# Patient Record
Sex: Female | Born: 1945 | Race: White | Hispanic: No | Marital: Married | Smoking: Never smoker
Health system: Southern US, Community
[De-identification: ages and names within clinical notes are randomized; demographics above are authoritative.]

## PROBLEM LIST (undated history)

## (undated) DIAGNOSIS — K56609 Unspecified intestinal obstruction, unspecified as to partial versus complete obstruction: Secondary | ICD-10-CM

## (undated) DIAGNOSIS — G43909 Migraine, unspecified, not intractable, without status migrainosus: Secondary | ICD-10-CM

## (undated) DIAGNOSIS — C50919 Malignant neoplasm of unspecified site of unspecified female breast: Secondary | ICD-10-CM

## (undated) DIAGNOSIS — R51 Headache: Secondary | ICD-10-CM

## (undated) DIAGNOSIS — H539 Unspecified visual disturbance: Secondary | ICD-10-CM

## (undated) HISTORY — PX: BREAST LUMPECTOMY: SHX2

## (undated) HISTORY — DX: Headache: R51

## (undated) HISTORY — DX: Malignant neoplasm of unspecified site of unspecified female breast: C50.919

## (undated) HISTORY — PX: TOTAL ABDOMINAL HYSTERECTOMY: SHX209

## (undated) HISTORY — DX: Migraine, unspecified, not intractable, without status migrainosus: G43.909

## (undated) HISTORY — DX: Unspecified visual disturbance: H53.9

## (undated) HISTORY — DX: Unspecified intestinal obstruction, unspecified as to partial versus complete obstruction: K56.609

---

## 2004-01-04 ENCOUNTER — Ambulatory Visit: Payer: Self-pay | Admitting: Oncology

## 2005-08-02 ENCOUNTER — Emergency Department (HOSPITAL_COMMUNITY): Admission: EM | Admit: 2005-08-02 | Discharge: 2005-08-02 | Payer: Self-pay | Admitting: Emergency Medicine

## 2012-06-15 ENCOUNTER — Encounter: Payer: Self-pay | Admitting: Nurse Practitioner

## 2012-06-15 ENCOUNTER — Ambulatory Visit (INDEPENDENT_AMBULATORY_CARE_PROVIDER_SITE_OTHER): Payer: Medicare Other | Admitting: Nurse Practitioner

## 2012-06-15 VITALS — BP 108/65 | HR 79 | Ht 59.5 in | Wt 118.0 lb

## 2012-06-15 DIAGNOSIS — G43009 Migraine without aura, not intractable, without status migrainosus: Secondary | ICD-10-CM

## 2012-06-15 NOTE — Progress Notes (Signed)
HPI: Patient returns for followup after last visit 09/20/2011. She has a history of migraines. She is currently on Topamax 400 mg daily even though  she has been asked to cut down the dose in the past due to her history of glaucoma. She has been on other preventatives, they were ineffective. Her headaches are in excellent control   ROS:   joint pain, constipation, weight gain, fatigue  Physical Exam General: well developed, well nourished, seated, in no evident distress Head: head normocephalic and atraumatic. Oropharynx benign Neck: supple with no carotid or supraclavicular bruits Cardiovascular: regular rate and rhythm, no murmurs  Neurologic Exam Mental Status: Awake and fully alert. Oriented to place and time. Recent and remote memory intact. Attention span, concentration and fund of knowledge appropriate. Mood and affect appropriate.  Cranial Nerves:  Pupils equal, briskly reactive to light. Extraocular movements full without nystagmus. Visual fields full to confrontation. Hearing intact and symmetric to finger snap. Facial sensation intact. Face, tongue, palate move normally and symmetrically. Neck flexion and extension normal.  Motor: Normal bulk and tone. Normal strength in all tested extremity muscles. Sensory.: intact to touch and pinprick and vibratory.  Coordination: Rapid alternating movements normal in all extremities. Finger-to-nose and heel-to-shin performed accurately bilaterally. Gait and Station: Arises from chair without difficulty. Stance is normal. Gait demonstrates normal stride length and balance . Able to heel, toe and tandem walk without difficulty.  Reflexes: 2+ and symmetric. Toes downgoing.     ASSESSMENT: Chronic headaches currently on Topamax 400 daily with excellent control.     PLAN: Continue Topamax for headache prevention, and does not need refills Imitrex acutely Followup in 6 months   Nilda Riggs, GNP-BC APRN

## 2012-06-15 NOTE — Patient Instructions (Addendum)
Continue Topamax for headache prevention Continue gabapentin for headache prevention Imitrex acutely Followup in 6 months

## 2012-07-10 ENCOUNTER — Other Ambulatory Visit: Payer: Self-pay | Admitting: Neurology

## 2012-07-21 ENCOUNTER — Other Ambulatory Visit: Payer: Self-pay | Admitting: Neurology

## 2012-09-08 ENCOUNTER — Telehealth: Payer: Self-pay | Admitting: Neurology

## 2012-09-08 MED ORDER — TOPIRAMATE 100 MG PO TABS: 200.0000 mg | ORAL_TABLET | Freq: Every day | ORAL | Status: DC

## 2012-09-08 NOTE — Telephone Encounter (Signed)
A 6 month supply was already sent last month: (E-Prescribing Status: Receipt confirmed by pharmacy (07/22/2012 8:08 AM EDT)) I have resent Rx.

## 2012-10-16 ENCOUNTER — Other Ambulatory Visit: Payer: Self-pay | Admitting: Neurology

## 2012-11-04 ENCOUNTER — Telehealth: Payer: Self-pay | Admitting: Nurse Practitioner

## 2012-11-05 NOTE — Telephone Encounter (Signed)
Please have patient increase her Gabapentin to 900mg  total dose, she can take 300mg  am 600mg  hs. I do not want to increase her Topamax as she has hx of Glaucoma . Stay at 200mg . Pt needs appt has not been seen since August 2013. Will update meds in system.  If she needs refills let us know.

## 2012-11-06 MED ORDER — TOPIRAMATE 100 MG PO TABS: ORAL_TABLET | ORAL | Status: DC

## 2012-11-06 NOTE — Telephone Encounter (Signed)
I called her back and relayed the below reccs.  She states the gabapentin does nothing for her headaches and she is aware as is her opthamologist that she has open angle glaucoma and taking th topamax.   She is reiterating her request about topamax. Please advis.e   Thanks   Headaches constant daily.

## 2012-11-06 NOTE — Telephone Encounter (Signed)
I discussed with Dr. Terrace Arabia. She wants to make sure we have records from opthamologist. Will increase, Dr. Terrace Arabia reinterated she needs an appt.

## 2012-11-06 NOTE — Telephone Encounter (Signed)
I called pt and relayed the information about topamax 100mg  po 1 tab am and 2 tab po pm.  Pt verbalized understanding.   She will have Dr. Eulah Pont send records attention to Enid Skeens, NP and she has appt 12/16/12 at 1500.

## 2012-11-20 ENCOUNTER — Other Ambulatory Visit: Payer: Self-pay | Admitting: Neurology

## 2012-12-16 ENCOUNTER — Ambulatory Visit: Payer: Medicare Other | Admitting: Nurse Practitioner

## 2012-12-30 ENCOUNTER — Other Ambulatory Visit: Payer: Self-pay | Admitting: Neurology

## 2013-01-20 ENCOUNTER — Ambulatory Visit: Payer: Medicare Other | Admitting: Nurse Practitioner

## 2013-02-18 ENCOUNTER — Other Ambulatory Visit: Payer: Self-pay | Admitting: Neurology

## 2013-02-21 ENCOUNTER — Other Ambulatory Visit: Payer: Self-pay

## 2013-02-21 MED ORDER — TOPIRAMATE 100 MG PO TABS: ORAL_TABLET | ORAL | Status: DC

## 2013-02-22 ENCOUNTER — Telehealth: Payer: Self-pay | Admitting: Neurology

## 2013-02-22 NOTE — Telephone Encounter (Signed)
Spoke to patient and relayed that she is being seen for migraines and not neuropathy.  She was told the Gabapentin was for migraines.  I explained that she would need a new referral for a new problem.  She expressed understanding and will be seeing her PCP next week and ask for the new referral at that visit.

## 2013-02-22 NOTE — Telephone Encounter (Signed)
Patient called stating that Gabapentin is not helping her legs at all, she states her legs burn all night long and that it is very disabling. Patient would like to try something else, please call her back and advise.

## 2013-02-22 NOTE — Telephone Encounter (Signed)
This patient is followed here for migraines only. She has never been evaluated for neuropathy. The Gabapentin was added for migraine. She would need a referral for a new problem from her PCP

## 2013-04-12 ENCOUNTER — Telehealth: Payer: Self-pay | Admitting: Neurology

## 2013-04-12 NOTE — Telephone Encounter (Signed)
Needs prior authorization for Sumatriptan Succinate--needs to check quantity first to see if prior authorization is needed.

## 2013-04-12 NOTE — Telephone Encounter (Signed)
I already contacted ins.  They responded by saying " 4 kits for a 30 day supply is covered by the plan with no prior authorization needed". Case # [id number]

## 2013-04-30 ENCOUNTER — Encounter: Payer: Self-pay | Admitting: Nurse Practitioner

## 2013-04-30 ENCOUNTER — Encounter (INDEPENDENT_AMBULATORY_CARE_PROVIDER_SITE_OTHER): Payer: Self-pay

## 2013-04-30 ENCOUNTER — Ambulatory Visit (INDEPENDENT_AMBULATORY_CARE_PROVIDER_SITE_OTHER): Payer: Medicare Other | Admitting: Nurse Practitioner

## 2013-04-30 VITALS — BP 123/80 | HR 101 | Ht 59.49 in | Wt 132.0 lb

## 2013-04-30 DIAGNOSIS — G43009 Migraine without aura, not intractable, without status migrainosus: Secondary | ICD-10-CM

## 2013-04-30 MED ORDER — TOPIRAMATE 100 MG PO TABS: 200.0000 mg | ORAL_TABLET | Freq: Every day | ORAL | Status: DC

## 2013-04-30 MED ORDER — GABAPENTIN 300 MG PO CAPS: ORAL_CAPSULE | ORAL | Status: DC

## 2013-04-30 MED ORDER — SUMATRIPTAN SUCCINATE 100 MG PO TABS: 100.0000 mg | ORAL_TABLET | ORAL | Status: DC | PRN

## 2013-04-30 NOTE — Progress Notes (Signed)
[facility]  PATIENT: Jill Salinas DOB: [birth year]   REASON FOR VISIT: Followup for migraines  HISTORY OF PRESENT ILLNESS: Jill Salinas, 68 year old female returns for followup,  she has migraines for many many years currently well controlled on 200 mg of Topamax at night. She also takes gabapentin which has helped her migraines as well. She uses Imitrex acutely both the injectable and the oral preparation. On return visit today she thinks her headaches are in good control. She says she was recently diagnosed with fibromyalgia and she currently has some sciatica and is going to physical therapy which was ordered by her primary care Dr. Alroy Dust. She returns for reevaluation.  HISTORY: She has a history of migraines for years.  She is currently on Topamax 400 mg daily even though she has been asked to cut down the dose in the past due to her history of glaucoma. She has been on other preventatives, such as Depakote but they were ineffective. She has also used Maxalt acutely but it no longer works. Her headaches are in excellent control      REVIEW OF SYSTEMS: Full 14 system review of systems performed and notable only for those listed, all others are neg:  Constitutional: Fatigue  Cardiovascular: N/A  Ear/Nose/Throat: N/A  Skin: N/A  Eyes: N/A  Respiratory: N/A  Gastroitestinal: N/A  Hematology/Lymphatic: N/A  Endocrine: N/A Musculoskeletal: Joint pain, aching muscles Allergy/Immunology: N/A  Neurological: N/A Psychiatric: N/A   ALLERGIES: Allergies  Allergen Reactions  . Depakote [Divalproex Sodium] Nausea And Vomiting    HOME MEDICATIONS: Outpatient Prescriptions Prior to Visit  Medication Sig Dispense Refill  . Denosumab (PROLIA [place]) Inject into the skin.      . DULoxetine (CYMBALTA) 60 MG capsule 30 mg 2 (two) times daily.      Marland Kitchen HYDROcodone-acetaminophen (NORCO) 10-325 MG per tablet 2 tablets every 4 (four) hours as needed.       . promethazine  (PHENERGAN) 25 MG tablet Take 25 mg by mouth every 6 (six) hours as needed for nausea.      . SUMAtriptan (IMITREX) 100 MG tablet TAKE ONE-HALF TO ONE TABLET BY MOUTH AS NEEDED FOR  ACUTE  MIGRAINE  -  NO  MORE  THAN  2  TABLETS  IN  24  HOURS  9 tablet  6  . SUMAtriptan (IMITREX) 6 MG/0.5ML SOLN injection Take 6 mg by mouth as needed for headache.       Marland Kitchen SYNTHROID 50 MCG tablet daily.      . temazepam (RESTORIL) 30 MG capsule Take 30 mg by mouth at bedtime as needed for sleep.      Marland Kitchen topiramate (TOPAMAX) 100 MG tablet 1 am 2 hs  90 tablet  1  . tretinoin (RETIN-A) 0.025 % cream       . gabapentin (NEURONTIN) 300 MG capsule TAKE ONE CAPSULE BY MOUTH TWICE DAILY AND TWO AT BEDTIME  120 capsule  2  . CALCIUM-VITAMIN D PO Take by mouth.      . traMADol (ULTRAM) 50 MG tablet 50 mg every 6 (six) hours as needed.        No facility-administered medications prior to visit.    PAST MEDICAL HISTORY: Past Medical History  Diagnosis Date  . Headache(784.0)   . Breast cancer     PAST SURGICAL HISTORY: Past Surgical History  Procedure Laterality Date  . Breast lumpectomy    . Total abdominal hysterectomy N/A     FAMILY HISTORY: Family History  Problem Relation Age of Onset  . Heart disease Mother   . Headache Mother   . Heart attack Father     SOCIAL HISTORY: History   Social History  . Marital Status: Single    Spouse Name: Gwyndolyn Saxon     Number of Children: 0  . Years of Education: Assoc   Occupational History  . Not on file.   Social History Main Topics  . Smoking status: Never Smoker   . Smokeless tobacco: Never Used  . Alcohol Use: Yes     Comment: drinks on the weekend  . Drug Use: No  . Sexual Activity: Not on file   Other Topics Concern  . Not on file   Social History Narrative   Patient lives at home with her husband. Gwyndolyn Saxon    Patient has an Geophysicist/field seismologist.    Patient has no children.    Patient is right handed.      PHYSICAL EXAM  Filed Vitals:    04/30/13 1117  BP: 123/80  Pulse: 101  Height: 4' 11.49" (1.511 m)  Weight: 132 lb (59.875 kg)   Body mass index is 26.23 kg/(m^2).  Generalized: Well developed, in no acute distress  Head: normocephalic and atraumatic,. Oropharynx benign  Neck: Supple, no carotid bruits  Cardiac: Regular rate rhythm, no murmur  Musculoskeletal: No deformity   Neurological examination   Mentation: Alert oriented to time, place, history taking. Follows all commands speech and language fluent  Cranial nerve II-XII: Pupils were equal round reactive to light extraocular movements were full, visual field were full on confrontational test. Facial sensation and strength were normal. hearing was intact to finger rubbing bilaterally. Uvula tongue midline. head turning and shoulder shrug were normal and symmetric.Tongue protrusion into cheek strength was normal. Motor: normal bulk and tone, full strength in the BUE, BLE, fine finger movements normal, no pronator drift. No focal weakness Coordination: finger-nose-finger, heel-to-shin bilaterally, no dysmetria Reflexes: Brachioradialis 2/2, biceps 2/2, triceps 2/2, patellar 2/2, Achilles 2/2, plantar responses were flexor bilaterally. Gait and Station: Rising up from seated position without assistance, normal stance,  moderate stride, good arm swing, smooth turning, able to perform tiptoe, and heel walking without difficulty. Tandem gait is steady  DIAGNOSTIC DATA (LABS, IMAGING, TESTING) -    ASSESSMENT AND PLAN  68 y.o. year old female  has a past medical history of Headache(784.0) here to followup. She is currently on Topamax 200 mg at night.  Continue Topamax at current dose will renew Continue gabapentin at current dose, will renew Continue sumatriptan, will renew Next visit with Dr. Krista Blue F/U 6 to 8 months [name], [facility], [facility] [address] [place], [place] [address] [phone]

## 2013-04-30 NOTE — Patient Instructions (Signed)
Continue Topamax at current dose will renew Continue gabapentin at current dose, will renew Continue sumatriptan, will renew Next visit with Dr. Krista Blue F/U 6 to 8 months

## 2013-05-07 ENCOUNTER — Telehealth: Payer: Self-pay | Admitting: Neurology

## 2013-05-07 NOTE — Telephone Encounter (Signed)
Pt called.  She was asking to get the results from the ID Gene test she had done during her 04-30-13 visit.  Please call with this information.  Thank you

## 2013-05-07 NOTE — Telephone Encounter (Signed)
Pt is calling requesting results from the ID Gene test she had done during her 04/30/13 OV. Dr. Krista Blue pt, sending to [facility], Dr. Brett Fairy. Please advise

## 2013-05-13 ENCOUNTER — Telehealth: Payer: Self-pay | Admitting: *Deleted

## 2013-05-13 NOTE — Telephone Encounter (Signed)
Called Jill Salinas to inform her that Dr. Krista Blue was out of the office until 05/26/13 and that I had given [name], the person that took the swab, the Jill Salinas's information and that as soon as the Doctor gets the results, that someone would be giving her a call back. Jill Salinas verbalized understanding.

## 2013-05-14 ENCOUNTER — Other Ambulatory Visit: Payer: Self-pay | Admitting: Neurology

## 2013-05-19 NOTE — Telephone Encounter (Signed)
I have not seen a result for this test. This was done under carolyn and i will forward this request to her.  Is this the buccal swaps new name? Than the test results take more than 14 days and come on paper. CD

## 2013-05-21 NOTE — Telephone Encounter (Signed)
I had to call for the report. Made  patient made  the Cymbalta and Hydrocodone are meds that should be used cautiously according to the report. We will send her a copy. She is agreeable to this.

## 2013-05-31 ENCOUNTER — Telehealth: Payer: Self-pay | Admitting: Neurology

## 2013-05-31 MED ORDER — TOPIRAMATE 100 MG PO TABS: ORAL_TABLET | ORAL | Status: DC

## 2013-05-31 NOTE — Telephone Encounter (Signed)
Rx has been updated and sent.  I called the patient back.  She is aware.   

## 2013-05-31 NOTE — Telephone Encounter (Signed)
Last Ov says:  She is currently on Topamax 200 mg at night. Patient states she has been taking 100mg  in am and 200mg  in pm.  She would like a new Rx sent for this dose.  Okay to change Rx?  Please advise.  Thank you.

## 2013-05-31 NOTE — Telephone Encounter (Signed)
Patient calling with questions regarding her Topamax dosage. Patient states she usually takes 1 in the morning and 2 in the evening. Patient picked up her refill and bottle states to take 2 per day. Please call and advise patient.

## 2013-05-31 NOTE — Telephone Encounter (Signed)
Yes that is ok she has been advised to decrease the dose in the past due to glaucoma but chooses not to.

## 2013-06-22 ENCOUNTER — Telehealth: Payer: Self-pay | Admitting: Neurology

## 2013-06-22 NOTE — Telephone Encounter (Signed)
Patient calling to state that she is currently in [place], and for the past month she has been feeling very lightheaded to the point where she is losing her balance and unsteady on her feet. Patient also complains about a pain on the back of her head. Please call and advise patient, she is currently at her mother's place and can be reached at [phone].

## 2013-06-22 NOTE — Telephone Encounter (Signed)
Called pt and pt stated that she is having pain in the back of her head, losing balance and unsteady on her feet. Pt wanted to know if she needed to have a test done to see if something was going on. Pt also stated that the medication is not helping. Pt's next appt is on 11/04/13. Please advise

## 2013-06-22 NOTE — Telephone Encounter (Signed)
Jill Salinas this is a new problem, we see her for migraines. Her next appt is supposed to be with Dr. Krista Blue, she is medicare

## 2013-06-23 NOTE — Telephone Encounter (Signed)
Jill Salinas, please put her on my schedule for next available.

## 2013-06-23 NOTE — Telephone Encounter (Signed)
Patient called me back Patient is having trouble walking and having nerve pain all over and she is having a lot of bone pain. Could this be coming from her migraines patient has been doing physical therapy patient stopped physical therapy one week  Ago. Patient is in [place] taking care of her mother. Patient wants to no if she needs to see Dr.Yan or  Or what route should she go please call me back at [phone].

## 2013-06-23 NOTE — Telephone Encounter (Signed)
Called patient and left her a message on her cell phone and home phone. We can see patient about her migraines but if she is having a new problem she will need a new referral . Asked patient to ask for me so I could speak to her a little more about what's  going on.  If patient wants to be seen for Migraines she can go on Dr. Rhea Belton schedule and see [name] after Dr. Krista Blue.

## 2013-06-24 NOTE — Telephone Encounter (Signed)
Called and spoke to patient she is coming to see Dr.Yan 06-25-2013.

## 2013-06-25 ENCOUNTER — Ambulatory Visit (INDEPENDENT_AMBULATORY_CARE_PROVIDER_SITE_OTHER): Payer: Medicare Other | Admitting: Neurology

## 2013-06-25 VITALS — BP 127/74 | HR 91 | Ht 59.0 in | Wt 134.0 lb

## 2013-06-25 DIAGNOSIS — M545 Low back pain, unspecified: Secondary | ICD-10-CM | POA: Insufficient documentation

## 2013-06-25 MED ORDER — MELOXICAM 7.5 MG PO TABS: 7.5000 mg | ORAL_TABLET | Freq: Two times a day (BID) | ORAL | Status: DC

## 2013-06-25 NOTE — Progress Notes (Signed)
[facility]  PATIENT: Jill Salinas DOB: 29-Sep-1945   HISTORY OF PRESENT ILLNESS: Ms. Bhagat, 68 year old female, last visit was with [name] in March 2015 for migraine.  She has migraines for many many years currently well controlled on 200 mg of Topamax at night. She also takes gabapentin 900mg  daily, which has helped her migraines as well. She uses Imitrex acutely both the injectable and the oral preparation. She thinks her headaches are in good control. She used imitrex po first, if that does not take care of her headaches, she would use injections.  This an early visit for her complains of 2 months history of right side neck pain, right occipital area pain, it bothers her 2/week, lasting all day, she felt spacy dealing with the pain, she does not like to drive,   She also had trouble walking, do not have the strength to get up an even steps, xone year, she has no incontinence, more so on the left leg, no involvement of left arm, she also has left knee swelling, felt so bad. She had 30 Lb weight gain over one year "can not get into any of my cloth"  She also complains of low back pain, radiating pain to her left leg, has tried physical therapy recently, which has been helpful, no incontinence.   REVIEW OF SYSTEMS: Full 14 system review of systems performed and notable only for those listed, all others are neg:     ALLERGIES: Allergies  Allergen Reactions  . Depakote [Divalproex Sodium] Nausea And Vomiting    HOME MEDICATIONS: Outpatient Prescriptions Prior to Visit  Medication Sig Dispense Refill  . Denosumab (PROLIA [place]) Inject into the skin.      . DULoxetine (CYMBALTA) 60 MG capsule 30 mg 2 (two) times daily.      Marland Kitchen gabapentin (NEURONTIN) 300 MG capsule 1 po daily and 2 at bedtime  90 capsule  8  . HYDROcodone-acetaminophen (NORCO) 10-325 MG per tablet 2 tablets every 4 (four) hours as needed.       . promethazine (PHENERGAN) 25 MG tablet Take 25 mg by mouth  every 6 (six) hours as needed for nausea.      . SUMAtriptan (IMITREX STATDOSE SYSTEM) 6 MG/0.5ML SOAJ Use as directed for migraine  2 mL  6  . SUMAtriptan (IMITREX) 100 MG tablet Take 1 tablet (100 mg total) by mouth as needed for migraine or headache. May repeat in 2 hours if headache persists or recurs.  9 tablet  6  . SUMAtriptan (IMITREX) 6 MG/0.5ML SOLN injection Take 6 mg by mouth as needed for headache.       Marland Kitchen SYNTHROID 50 MCG tablet daily.      . temazepam (RESTORIL) 30 MG capsule Take 30 mg by mouth at bedtime as needed for sleep.      Marland Kitchen topiramate (TOPAMAX) 100 MG tablet Take one tablet po in the am and two tabs po in the pm  90 tablet  8  . tretinoin (RETIN-A) 0.025 % cream        PAST MEDICAL HISTORY: Past Medical History  Diagnosis Date  . Headache(784.0)   . Breast cancer     PAST SURGICAL HISTORY: Past Surgical History  Procedure Laterality Date  . Breast lumpectomy    . Total abdominal hysterectomy N/A     FAMILY HISTORY: Family History  Problem Relation Age of Onset  . Heart disease Mother   . Headache Mother   . Heart attack Father  SOCIAL HISTORY: History   Social History  . Marital Status: Single    Spouse Name: Gwyndolyn Saxon     Number of Children: 0  . Years of Education: Assoc   Occupational History  . Not on file.   Social History Main Topics  . Smoking status: Never Smoker   . Smokeless tobacco: Never Used  . Alcohol Use: Yes     Comment: drinks on the weekend  . Drug Use: No  . Sexual Activity: Not on file   Other Topics Concern  . Not on file   Social History Narrative   Patient lives at home with her husband. Gwyndolyn Saxon    Patient has an Geophysicist/field seismologist.    Patient has no children.    Patient is right handed.      PHYSICAL EXAM  Filed Vitals:   06/25/13 1206  BP: 127/74  Pulse: 91  Height: 4\' 11"  (1.499 m)  Weight: 134 lb (60.782 kg)   Body mass index is 27.05 kg/(m^2).  Generalized: Well developed, in no acute  distress  Head: normocephalic and atraumatic,. Oropharynx benign  Neck: Supple, no carotid bruits  Cardiac: Regular rate rhythm, no murmur  Musculoskeletal: No deformity   Neurological examination   Mentation: Alert oriented to time, place, history taking. Follows all commands speech and language fluent  Cranial nerve II-XII: Pupils were equal round reactive to light extraocular movements were full, visual field were full on confrontational test. Facial sensation and strength were normal. hearing was intact to finger rubbing bilaterally. Uvula tongue midline. head turning and shoulder shrug were normal and symmetric.Tongue protrusion into cheek strength was normal. Motor: normal bulk and tone, full strength in the BUE, BLE, fine finger movements normal, no pronator drift. No focal weakness Coordination: finger-nose-finger, heel-to-shin bilaterally, no dysmetria Reflexes: Brachioradialis 2/2, biceps 2/2, triceps 2/2, patellar 2/2, Achilles 2/2, plantar responses were flexor bilaterally. Gait and Station: Rising up from seated position without assistance, normal stance,  moderate stride, good arm swing, smooth turning, able to perform tiptoe, and heel walking without difficulty. Tandem gait is steady  DIAGNOSTIC DATA (LABS, IMAGING, TESTING) -    ASSESSMENT AND PLAN  68 y.o. year old female  has a past medical history of Headache(784.0) here to followup. She is currently on Topamax 200 mg at night. Now complains of low back pain, radiating pain to her left lower extremity,  Differentiation diagnosis including left lumbar radiculopathy, proceed with MRI of lumbar, EMG nerve conduction study.    [facility] [address] [place], [place] [address] [phone]

## 2013-07-01 ENCOUNTER — Telehealth: Payer: Self-pay | Admitting: Neurology

## 2013-07-01 NOTE — Telephone Encounter (Signed)
Jill Salinas, patient received your message and stated she has tried all your suggestions previously.  Could Dr Krista Blue prescribe something else due to medication is hard on her stomach.  Thanks

## 2013-07-01 NOTE — Telephone Encounter (Signed)
I called the patient back.  Got no answer, left message.   

## 2013-07-01 NOTE — Telephone Encounter (Signed)
Left message for patient to call back and schedule nerve conduction and EMG test per Dr. Krista Blue.

## 2013-07-01 NOTE — Telephone Encounter (Signed)
Patient called and stated medication meloxicam (MOBIC) 7.5 MG tablet makes her very nauseated.  Please call and advise.

## 2013-07-01 NOTE — Telephone Encounter (Signed)
Patient says Meloxicam is making her nauseated and she would like something else prescribed.  I verified tat she is taking this med with food, but says that still does not help.  Please advise.

## 2013-07-02 ENCOUNTER — Encounter: Payer: Self-pay | Admitting: Neurology

## 2013-07-02 NOTE — Telephone Encounter (Signed)
I have called Jill Salinas, she complains of pain in her stomach, she is taking mobic after meal, but still has a lot of GI side effect,   She is taking pain medications for her low back pain and radiating pain to her left leg, gabapentin 300mg  1/2 qhs, complains of the side effect from gabapentin, higher dose makes her feel spacey, I have encouraged her higher dose of gabapentin up to 1800 mg a day, avoid driving  Pending MRI of lumbar, EMG nerve conduction study, depending on the result, may refer her to pain management,

## 2013-07-02 NOTE — Telephone Encounter (Signed)
Patient calling again--waiting for returned call--I told patient we are waiting on a response from Dr. Sherlyn Lees is anxious for response--thank you.

## 2013-07-13 ENCOUNTER — Encounter: Payer: Medicare Other | Admitting: Neurology

## 2013-07-14 ENCOUNTER — Ambulatory Visit
Admission: RE | Admit: 2013-07-14 | Discharge: 2013-07-14 | Disposition: A | Discharge status: Home / Self Care | Payer: Medicare Other | Source: Ambulatory Visit | Attending: Neurology | Admitting: Neurology

## 2013-07-14 DIAGNOSIS — M79609 Pain in unspecified limb: Secondary | ICD-10-CM

## 2013-07-14 DIAGNOSIS — M545 Low back pain, unspecified: Secondary | ICD-10-CM

## 2013-07-21 ENCOUNTER — Telehealth: Payer: Self-pay | Admitting: Neurology

## 2013-07-21 NOTE — Telephone Encounter (Signed)
Jill Salinas, Please call patient, MRI lumbar showed degenerative disc disease, but no significant canal, foraminal stenosis, nerve compression.

## 2013-07-27 NOTE — Telephone Encounter (Signed)
Left message with MRI lumbar results showing degenerative disc disease, per Dr. Krista Blue.  Told to call if she had any questions.

## 2013-07-28 ENCOUNTER — Telehealth: Payer: Self-pay | Admitting: Neurology

## 2013-07-28 NOTE — Telephone Encounter (Signed)
I called the patient back.  She is having increased burning sensation and does not feel that Gabapentin is helping her.  She would like to discontinue this med and requested to go on a higher dose of Mobic, or alternate medication instead.  Please advise.  Thank you.

## 2013-07-28 NOTE — Telephone Encounter (Signed)
I called the patient back.  Got no answer.  Left message. 

## 2013-07-28 NOTE — Telephone Encounter (Signed)
Jill Salinas, please advise patient that she can stop taking gabapentin if she feels like it is not helping her, keep on current dose of Mobic 7 point 5 mg twice a day, further discussion of her medication at her EMG nerve conduction study June 24th

## 2013-07-28 NOTE — Telephone Encounter (Signed)
Pt called wants to know if she can go off the  gabapentin (NEURONTIN) 300 MG capsule, and go back on the meloxicam (MOBIC) 7.5 MG tablet and increase the dosage to see if that would help the burning. Please call pt concerning this matter. Thanks

## 2013-08-04 ENCOUNTER — Encounter (INDEPENDENT_AMBULATORY_CARE_PROVIDER_SITE_OTHER): Payer: Self-pay

## 2013-08-04 ENCOUNTER — Ambulatory Visit (INDEPENDENT_AMBULATORY_CARE_PROVIDER_SITE_OTHER): Payer: Medicare Other | Admitting: Neurology

## 2013-08-04 DIAGNOSIS — M545 Low back pain, unspecified: Secondary | ICD-10-CM

## 2013-08-04 DIAGNOSIS — Z0289 Encounter for other administrative examinations: Secondary | ICD-10-CM

## 2013-08-04 DIAGNOSIS — M542 Cervicalgia: Secondary | ICD-10-CM

## 2013-08-04 DIAGNOSIS — M79605 Pain in left leg: Secondary | ICD-10-CM

## 2013-08-04 DIAGNOSIS — M79606 Pain in leg, unspecified: Secondary | ICD-10-CM | POA: Insufficient documentation

## 2013-08-04 DIAGNOSIS — M79609 Pain in unspecified limb: Secondary | ICD-10-CM

## 2013-08-04 NOTE — Progress Notes (Addendum)
[facility]  PATIENT: Jill Salinas DOB: 1945-08-30   HISTORY OF PRESENT ILLNESS: Jill Salinas, 67 year old female, last visit was with [name] in March 2015 for migraine.  She has migraines for many many years currently well controlled on 200 mg of Topamax at night. She also takes gabapentin 900mg  daily, which has helped her migraines as well. She uses Imitrex acutely both the injectable and the oral preparation. She thinks her headaches are in good control. She used imitrex po first, if that does not take care of her headaches, she would use injections.  This an early visit for her complains of 2 months history of right side neck pain, right occipital area pain, it bothers her 2/week, lasting all day, she felt spacy dealing with the pain, she does not like to drive,   She also had trouble walking, do not have the strength to get up an even steps, xone year, she has no incontinence, more so on the left leg, no involvement of left arm, she also has left knee swelling, felt so bad. She had 30 Lb weight gain over one year "can not get into any of my cloth"  She also complains of low back pain, radiating pain to her left leg, has tried physical therapy recently, which has been helpful, no incontinence.   UPDATE June 24th 2015: She came in for electrodiagnostic study today, there was no evidence of left lumbar radiculopathy, there was evidence of right triceps irritation, but no neuropathic changes at other selected right upper extremity needle examination.  We have reviewed MRI lumbar film together, only mild degenerative disc disease, there was no significant foraminal, or canal stenosis   She is taking Cymbalta now, which has helped her some, but continued complaint bilateral lower extremity deep achy pain, especially at nighttime, she no longer has shooting pain from her neck to her shoulders, or arms, but she complains of deep achy pain in her midline upper cervical  region,  She also complains fatigued easily, lack of stamina  REVIEW OF SYSTEMS: Full 14 system review of systems performed and notable only for those listed, all others are neg:      ALLERGIES: Allergies  Allergen Reactions  . Depakote [Divalproex Sodium] Nausea And Vomiting    HOME MEDICATIONS: Outpatient Prescriptions Prior to Visit  Medication Sig Dispense Refill  . Denosumab (PROLIA [place]) Inject into the skin.      . DULoxetine (CYMBALTA) 60 MG capsule 30 mg 2 (two) times daily.      Marland Kitchen gabapentin (NEURONTIN) 300 MG capsule 1 po daily and 2 at bedtime  90 capsule  8  . HYDROcodone-acetaminophen (NORCO) 10-325 MG per tablet 2 tablets every 4 (four) hours as needed.       . promethazine (PHENERGAN) 25 MG tablet Take 25 mg by mouth every 6 (six) hours as needed for nausea.      . SUMAtriptan (IMITREX STATDOSE SYSTEM) 6 MG/0.5ML SOAJ Use as directed for migraine  2 mL  6  . SUMAtriptan (IMITREX) 100 MG tablet Take 1 tablet (100 mg total) by mouth as needed for migraine or headache. May repeat in 2 hours if headache persists or recurs.  9 tablet  6  . SUMAtriptan (IMITREX) 6 MG/0.5ML SOLN injection Take 6 mg by mouth as needed for headache.       Marland Kitchen SYNTHROID 50 MCG tablet daily.      . temazepam (RESTORIL) 30 MG capsule Take 30 mg by mouth at bedtime as needed  for sleep.      Marland Kitchen topiramate (TOPAMAX) 100 MG tablet Take one tablet po in the am and two tabs po in the pm  90 tablet  8  . tretinoin (RETIN-A) 0.025 % cream        PAST MEDICAL HISTORY: Past Medical History  Diagnosis Date  . Headache(784.0)   . Breast cancer     PAST SURGICAL HISTORY: Past Surgical History  Procedure Laterality Date  . Breast lumpectomy    . Total abdominal hysterectomy N/A     FAMILY HISTORY: Family History  Problem Relation Age of Onset  . Heart disease Mother   . Headache Mother   . Heart attack Father     SOCIAL HISTORY: History   Social History  . Marital Status: Single    Spouse  Name: Gwyndolyn Saxon     Number of Children: 0  . Years of Education: Assoc   Occupational History  . Not on file.   Social History Main Topics  . Smoking status: Never Smoker   . Smokeless tobacco: Never Used  . Alcohol Use: Yes     Comment: drinks on the weekend  . Drug Use: No  . Sexual Activity: Not on file   Other Topics Concern  . Not on file   Social History Narrative   Patient lives at home with her husband. Gwyndolyn Saxon    Patient has an Geophysicist/field seismologist.    Patient has no children.    Patient is right handed.      PHYSICAL EXAM  There were no vitals filed for this visit. There is no weight on file to calculate BMI.  Generalized: Well developed, in no acute distress  Head: normocephalic and atraumatic,. Oropharynx benign  Neck: Supple, no carotid bruits  Cardiac: Regular rate rhythm, no murmur  Musculoskeletal: No deformity   Neurological examination   Mentation: Alert oriented to time, place, history taking. Follows all commands speech and language fluent  Cranial nerve II-XII: Pupils were equal round reactive to light extraocular movements were full, visual field were full on confrontational test. Facial sensation and strength were normal. hearing was intact to finger rubbing bilaterally. Uvula tongue midline. head turning and shoulder shrug were normal and symmetric.Tongue protrusion into cheek strength was normal. Motor: normal bulk and tone, full strength in the BUE, BLE, fine finger movements normal, no pronator drift. No focal weakness Coordination: finger-nose-finger, heel-to-shin bilaterally, no dysmetria Reflexes: Brachioradialis 3/3, biceps 3/3, triceps 3/3, patellar 3/3, Achilles 2/2, plantar responses were flexor bilaterally. Gait and Station: Rising up from seated position without assistance, normal stance,  moderate stride, good arm swing, smooth turning, able to perform tiptoe, and heel walking without difficulty. Tandem gait is steady  DIAGNOSTIC DATA  (LABS, IMAGING, TESTING) -    ASSESSMENT AND PLAN  68 y.o. year old female  has a past medical history of Headache(784.0) here to followup. She is currently on Topamax 200 mg at night. Now complains of low back pain, radiating pain to her left lower extremity, also complains of neck pain, bilateral shoulder deep achy pain, lack of stamina, bilateral lower extremity deep achy pain, today's electrodiagnostic study showed no evidence of right lumbar radiculopathy, MRI of lumbar spine only demonstrated mild degenerative disc disease, she was found to be hyperreflexia, irritation at right triceps muscles at selected needle examination, but no evidence of active right cervical radiculopathy Differentiation diagnosis also including, cervical spondylitic myelopathy, will proceed with MRI of cervical spine, return to clinic in one month  [facility] [address] [place], [place] [address] [phone]

## 2013-08-04 NOTE — Procedures (Addendum)
   NCS (NERVE CONDUCTION STUDY) WITH EMG (ELECTROMYOGRAPHY) REPORT   STUDY DATE: June 24th 2015 PATIENT NAME: [name] DOB: 1945/10/03 MRN: [id number]    TECHNOLOGIST: Laretta Alstrom ELECTROMYOGRAPHER: Marcial Pacas M.D.  CLINICAL INFORMATION:  68 years old female, complains of neck achy pain, low back pain, radiating pain to her left leg  FINDINGS: NERVE CONDUCTION STUDY: Bilateral peroneal sensory responses were normal. Bilateral peroneal, tibial plantar responses were normal. Bilateral tibial H. reflexes were normal and symmetric  Bilateral median, ulnar sensory and motor responses were normal  NEEDLE ELECTROMYOGRAPHY: Selected needle examination was performed at right upper extremity muscles, right cervical paraspinals, left lower extremity muscles, left lumbosacral paraspinal muscles.  Needle examination of left tibialis anterior, tibialis posterior, medial gastrocnemius, peroneal longus, vastus lateralis was normal There was no spontaneous activity at left lumbosacral paraspinal muscles, left L4-5 S1  Needle examination of right extensor digital communis, biceps, deltoid, pronator teres was normal.  There was increased insertion activity at right triceps, CRDs,normal morphology and motor unit potential, with normal recruitment patterns  There was no spontaneous activity at right cervical paraspinal muscles, right C5-6 and 7  IMPRESSION:   This is a slight abnormal study. There is evidence of irritation at right C7 nerve roots, at right triceps muscles, but there was no evidence of active right cervical radiculopathy, there was no evidence of left lumbar radiculopathy, or large fiber peripheral neuropathy,   INTERPRETING PHYSICIAN:   Marcial Pacas M.D. Ph.D. [facility] [address] [place], [place] [address] [phone]

## 2013-09-07 ENCOUNTER — Other Ambulatory Visit: Payer: Medicare Other

## 2013-09-08 ENCOUNTER — Ambulatory Visit
Admission: RE | Admit: 2013-09-08 | Discharge: 2013-09-08 | Disposition: A | Discharge status: Home / Self Care | Payer: Medicare Other | Source: Ambulatory Visit | Attending: Neurology | Admitting: Neurology

## 2013-09-08 DIAGNOSIS — M542 Cervicalgia: Secondary | ICD-10-CM

## 2013-09-08 DIAGNOSIS — M79605 Pain in left leg: Secondary | ICD-10-CM

## 2013-09-09 ENCOUNTER — Telehealth: Payer: Self-pay | Admitting: Neurology

## 2013-09-09 NOTE — Telephone Encounter (Signed)
Will go over MRI cervical in her September 10 2013 visit.

## 2013-09-10 ENCOUNTER — Encounter: Payer: Self-pay | Admitting: Neurology

## 2013-09-10 ENCOUNTER — Ambulatory Visit (INDEPENDENT_AMBULATORY_CARE_PROVIDER_SITE_OTHER): Payer: Medicare Other | Admitting: Neurology

## 2013-09-10 VITALS — BP 116/67 | HR 79 | Ht 59.0 in | Wt 132.0 lb

## 2013-09-10 DIAGNOSIS — G43009 Migraine without aura, not intractable, without status migrainosus: Secondary | ICD-10-CM

## 2013-09-10 DIAGNOSIS — M545 Low back pain, unspecified: Secondary | ICD-10-CM

## 2013-09-10 DIAGNOSIS — G43909 Migraine, unspecified, not intractable, without status migrainosus: Secondary | ICD-10-CM | POA: Insufficient documentation

## 2013-09-10 NOTE — Progress Notes (Signed)
[facility]  PATIENT: Jill Salinas DOB: 12-12-1945   HISTORY OF PRESENT ILLNESS: Jill Salinas, 68 year old female, last visit was with [name] in March 2015 for migraine.  She has migraines for many many years currently well controlled on 200 mg of Topamax at night. She also takes gabapentin 900mg  daily, which has helped her migraines as well. She uses Imitrex acutely both the injectable and the oral preparation. She thinks her headaches are in good control. She used imitrex po first, if that does not take care of her headaches, she would use injections.  This an early visit for her complains of 2 months history of right side neck pain, right occipital area pain, it bothers her 2/week, lasting all day, she felt spacy dealing with the pain, she does not like to drive,   She also had trouble walking, do not have the strength to get up an even steps, xone year, she has no incontinence, more so on the left leg, no involvement of left arm, she also has left knee swelling, felt so bad. She had 30 Lb weight gain over one year "can not get into any of my cloth"  She also complains of low back pain, radiating pain to her left leg, has tried physical therapy recently, which has been helpful, no incontinence.   UPDATE June 24th 2015: She came in for electrodiagnostic study today, there was no evidence of left lumbar radiculopathy, there was evidence of right triceps irritation, but no neuropathic changes at other selected right upper extremity needle examination.  We have reviewed MRI lumbar film together, only mild degenerative disc disease, there was no significant foraminal, or canal stenosis   She is taking Cymbalta now, which has helped her some, but continued complaint bilateral lower extremity deep achy pain, especially at nighttime, she no longer has shooting pain from her neck to her shoulders, or arms, but she complains of deep achy pain in her midline upper cervical  region,  She also complains fatigued easily, lack of stamina  UPDATE September 10 2013:  Her main concern is her constant midline low back pain, there is no radiating pain, but she complains of bilateral lower extremity heaviness, burning discomfort sometimes, she also complains of generalized fatigue, lack of stamina,  She denies significant neck pain, no bowel bladder incontinence, have reviewed MRI of cervical spine, C3-4: left facet hypertrophy with severe left foraminal stenosis.  Mild disc bulging at C3-4, C5-6, 6-7. There was no evidence of central canal stenosis  REVIEW OF SYSTEMS: Full 14 system review of systems performed and notable only for those listed, all others are neg:   Fatigue, drink sweating, low back pain, achy muscles, walking difficulty, weakness,     ALLERGIES: Allergies  Allergen Reactions  . Depakote [Divalproex Sodium] Nausea And Vomiting  . Iodinated Diagnostic Agents     Tthroat itching    HOME MEDICATIONS: Outpatient Prescriptions Prior to Visit  Medication Sig Dispense Refill  . Denosumab (PROLIA [place]) Inject into the skin.      . DULoxetine (CYMBALTA) 60 MG capsule 30 mg 2 (two) times daily.      Marland Kitchen gabapentin (NEURONTIN) 300 MG capsule 1 po daily and 2 at bedtime  90 capsule  8  . HYDROcodone-acetaminophen (NORCO) 10-325 MG per tablet 2 tablets every 4 (four) hours as needed.       . promethazine (PHENERGAN) 25 MG tablet Take 25 mg by mouth every 6 (six) hours as needed for nausea.      Marland Kitchen  SUMAtriptan (IMITREX STATDOSE SYSTEM) 6 MG/0.5ML SOAJ Use as directed for migraine  2 mL  6  . SUMAtriptan (IMITREX) 100 MG tablet Take 1 tablet (100 mg total) by mouth as needed for migraine or headache. May repeat in 2 hours if headache persists or recurs.  9 tablet  6  . SUMAtriptan (IMITREX) 6 MG/0.5ML SOLN injection Take 6 mg by mouth as needed for headache.       Marland Kitchen SYNTHROID 50 MCG tablet daily.      . temazepam (RESTORIL) 30 MG capsule Take 30 mg by mouth at  bedtime as needed for sleep.      Marland Kitchen topiramate (TOPAMAX) 100 MG tablet Take one tablet po in the am and two tabs po in the pm  90 tablet  8  . tretinoin (RETIN-A) 0.025 % cream        PAST MEDICAL HISTORY: Past Medical History  Diagnosis Date  . Headache(784.0)   . Breast cancer   . Migraine     PAST SURGICAL HISTORY: Past Surgical History  Procedure Laterality Date  . Breast lumpectomy    . Total abdominal hysterectomy N/A     FAMILY HISTORY: Family History  Problem Relation Age of Onset  . Heart disease Mother   . Headache Mother   . Heart attack Father     SOCIAL HISTORY: History   Social History  . Marital Status: Single    Spouse Name: Gwyndolyn Saxon     Number of Children: 0  . Years of Education: Assoc   Occupational History  . Not on file.   Social History Main Topics  . Smoking status: Never Smoker   . Smokeless tobacco: Never Used  . Alcohol Use: Yes     Comment: drinks on the weekend  . Drug Use: No  . Sexual Activity: Not on file   Other Topics Concern  . Not on file   Social History Narrative   Patient lives at home with her husband. Gwyndolyn Saxon    Patient has an Geophysicist/field seismologist.    Patient has no children.    Patient is right handed.      PHYSICAL EXAM  Filed Vitals:   09/10/13 1440  BP: 116/67  Pulse: 79  Height: 4\' 11"  (1.499 m)  Weight: 132 lb (59.875 kg)   Body mass index is 26.65 kg/(m^2).  Generalized: Well developed, in no acute distress  Head: normocephalic and atraumatic,. Oropharynx benign  Neck: Supple, no carotid bruits  Cardiac: Regular rate rhythm, no murmur  Musculoskeletal: No deformity   Neurological examination   Mentation: Alert oriented to time, place, history taking. Follows all commands speech and language fluent  Cranial nerve II-XII: Pupils were equal round reactive to light extraocular movements were full, visual field were full on confrontational test. Facial sensation and strength were normal. hearing was  intact to finger rubbing bilaterally. Uvula tongue midline. head turning and shoulder shrug were normal and symmetric.Tongue protrusion into cheek strength was normal. Motor: normal bulk and tone, full strength in the BUE, BLE, fine finger movements normal, no pronator drift. No focal weakness Coordination: finger-nose-finger, heel-to-shin bilaterally, no dysmetria Reflexes: Brisk and symmetric, plantar responses were flexor bilaterally. Gait and Station: Rising up from seated position without assistance, normal stance,  moderate stride, good arm swing, smooth turning, able to perform tiptoe, and heel walking without difficulty. Tandem gait is steady  DIAGNOSTIC DATA (LABS, IMAGING, TESTING) -  ASSESSMENT AND PLAN  68 y.o. year old female   with past  medical history of headache. She is currently on Topamax 200 mg at night. Now complains of low back pain, radiating pain to her left lower extremity, also complains of neck pain, bilateral shoulder deep achy pain, lack of stamina, bilateral lower extremity deep achy pain,  electrodiagnostic study showed no evidence of right lumbar radiculopathy, MRI of lumbar spine only demonstrated mild degenerative disc disease,  MRI of cervical spine showed C3-4: left facet hypertrophy with severe left foraminal stenosis, she denies significant neck pain, there was no significant weakness.  I will refer her to physical therapy, she may continue followup with her primary care, and pain management for management of her chronic pain, and fatigue  Marcial Pacas, M.D. [facility] [address] [place], [place] [address] [phone]

## 2013-09-15 ENCOUNTER — Telehealth: Payer: Self-pay | Admitting: Neurology

## 2013-09-15 MED ORDER — MELOXICAM 7.5 MG PO TABS: 7.5000 mg | ORAL_TABLET | Freq: Two times a day (BID) | ORAL | Status: DC

## 2013-09-15 NOTE — Addendum Note (Signed)
Addended by: Marcial Pacas on: 09/15/2013 02:57 PM   Modules accepted: Orders

## 2013-09-15 NOTE — Telephone Encounter (Signed)
I have called her, she complains of low back pain, radiating pain to left leg, she has been taken neurontin, complains of excessive weight gain, also worries about weight gain side effect from Lyrica, from her step mother's experience.  I called in Mobic 7.5mg  bid prn, after meal

## 2013-09-15 NOTE — Telephone Encounter (Signed)
Patient calling to request that her Gabapentin medication be changed to something else because she has noticed that it is causing her to gain weight, please return call to patient and advise.

## 2013-09-15 NOTE — Telephone Encounter (Signed)
Per phone note on 06/17, Dr Krista Blue said patient could D/C Gabapentin.  I called the patient back.  Got no answer.  Left message.

## 2013-09-29 ENCOUNTER — Telehealth: Payer: Self-pay | Admitting: *Deleted

## 2013-09-29 ENCOUNTER — Telehealth: Payer: Self-pay | Admitting: Neurology

## 2013-09-29 DIAGNOSIS — M5432 Sciatica, left side: Secondary | ICD-10-CM

## 2013-09-29 NOTE — Telephone Encounter (Signed)
Patient called saying Mobic isn't helping with her pain and she would like a dose increase (already at max dose of 15mg  daily) or a new Rx prescribed. I called the patient back. Said she has decided she does not want to see a pain doctor and does not wish to consult PCP regarding pain. She would like something prescribed by [place]. Dr Krista Blue is out of the office. Forwarding message to [facility] for review.

## 2013-09-29 NOTE — Telephone Encounter (Signed)
Patient stated meloxicam (MOBIC) 7.5 MG tablet isn't helping with pain.  She takes 1 tab in the am and 1 tab in the pm, unable to sleep due to pain.  Feels she can't function during the day due to pain.  Questioning if she needs to increase dosage or does she need another medication?  Please call anytime and if not available on home number, please call cell @ [phone].  May leave message if not available.

## 2013-09-29 NOTE — Telephone Encounter (Signed)
I called back.  Relayed message from Dr Rexene Alberts.  Patient said she will use heat on her back, and again repeated she is not willing to go to a pain clinic.  Says she will call us back if anything further is needed.

## 2013-09-29 NOTE — Telephone Encounter (Signed)
I called patient. The patient is having ongoing chronic low back pain for 7 years, worse over the last one year. MRI of the lumbosacral spine showing relatively mild spondylitic changes, without nerve root impingement. The patient reports pain down the left leg. I will try an epidural steroid injection to see if this is beneficial. The patient has not gained much benefit with gabapentin, Mobic, or Cymbalta.

## 2013-09-29 NOTE — Telephone Encounter (Signed)
Patient calling needing a release form mail.

## 2013-09-29 NOTE — Telephone Encounter (Signed)
Patient called saying Mobic isn't helping with her pain and she would like a dose increase (already at max dose of 15mg  daily) or a new Rx prescribed.  I called the patient back.  Said she has decided she does not want to see a pain doctor and does not wish to consult PCP regarding pain.  She would like something prescribed by [place].  Dr Krista Blue is out of the office.  Forwarding message to [facility] for review.    OV note from 07/31 says: I will refer her to physical therapy, she may continue followup with her primary care, and pain management for management of her chronic pain, and fatigue. Phone note from 08/05 says: I have called her, she complains of low back pain, radiating pain to left leg, she has been taken neurontin, complains of excessive weight gain, also worries about weight gain side effect from Lyrica, from her step mother's experience.  I called in Mobic 7.5mg  bid prn, after meal

## 2013-09-29 NOTE — Addendum Note (Signed)
Addended by: Margette Fast on: 09/29/2013 05:03 PM   Modules accepted: Orders

## 2013-09-29 NOTE — Telephone Encounter (Signed)
Unfortunately, I really don't have any bright ideas regarding back pain management. We typically don't manage chronic back pain. I think the best option would be to follow recommendations from Dr. Krista Blue to pursue physical therapy , maybe a referral to pain management versus orthopedics. Sometimes a heat pad helps.

## 2013-09-30 ENCOUNTER — Other Ambulatory Visit: Payer: Self-pay | Admitting: Neurology

## 2013-09-30 DIAGNOSIS — M79605 Pain in left leg: Secondary | ICD-10-CM

## 2013-09-30 DIAGNOSIS — G8929 Other chronic pain: Secondary | ICD-10-CM

## 2013-09-30 DIAGNOSIS — M545 Low back pain, unspecified: Secondary | ICD-10-CM

## 2013-10-01 ENCOUNTER — Telehealth: Payer: Self-pay | Admitting: *Deleted

## 2013-10-01 NOTE — Telephone Encounter (Addendum)
Received fax from [facility], stating pt is scheduled for her injection. (epidural steroid).  10-04-13 1330  ([address]).

## 2013-10-04 ENCOUNTER — Ambulatory Visit
Admission: RE | Admit: 2013-10-04 | Discharge: 2013-10-04 | Disposition: A | Discharge status: Home / Self Care | Payer: Medicare Other | Source: Ambulatory Visit | Attending: Neurology | Admitting: Neurology

## 2013-10-04 DIAGNOSIS — G8929 Other chronic pain: Secondary | ICD-10-CM

## 2013-10-04 DIAGNOSIS — M79605 Pain in left leg: Secondary | ICD-10-CM

## 2013-10-04 DIAGNOSIS — M545 Low back pain, unspecified: Secondary | ICD-10-CM

## 2013-10-04 MED ORDER — METHYLPREDNISOLONE ACETATE 40 MG/ML INJ SUSP (RADIOLOG
120.0000 mg | Freq: Once | INTRAMUSCULAR | Status: AC
  Administered 2013-10-04: 120 mg via EPIDURAL

## 2013-10-04 MED ORDER — IOHEXOL 180 MG/ML  SOLN
1.0000 mL | Freq: Once | INTRAMUSCULAR | Status: AC | PRN
  Administered 2013-10-04: 1 mL via EPIDURAL

## 2013-10-04 NOTE — Discharge Instructions (Signed)

## 2013-10-12 ENCOUNTER — Telehealth: Payer: Self-pay | Admitting: *Deleted

## 2013-10-12 NOTE — Telephone Encounter (Signed)
We will address her issue into September 24 followup visit

## 2013-10-12 NOTE — Telephone Encounter (Signed)
Pt calling about getting another epidural injection.  She had last one 10-04-13 at [facility] with Dr. Lawrence Santiago.  [phone], [phone].  (She had such good results from the last one) and noted that even so brief she wanted to see about getting another.  I relayed that I did not know or if they mentioned about when it would be safe to get another one.  She did not know.

## 2013-10-22 ENCOUNTER — Telehealth: Payer: Self-pay | Admitting: Neurology

## 2013-10-22 NOTE — Telephone Encounter (Signed)
I called pt and LMVM for her that apologized that did not receive call back. Dr Krista Blue made note, and wanted to address when you are in 11-04-13.  Pt to call back if having issues and need sooner appt, try to get in.

## 2013-10-22 NOTE — Telephone Encounter (Signed)
I called pt and was able to move appt up to 10-25-13 at 1130 to reassess. Pt verbalized understanding.  She stated meloxicam not working.

## 2013-10-22 NOTE — Telephone Encounter (Signed)
Danielle with [facility] @ [phone] called questioning if patient will have 2nd injection after OV with Dr. Krista Blue on 9/24?  Please call and advise.

## 2013-10-25 ENCOUNTER — Telehealth: Payer: Self-pay

## 2013-10-25 ENCOUNTER — Encounter: Payer: Self-pay | Admitting: Neurology

## 2013-10-25 ENCOUNTER — Ambulatory Visit (INDEPENDENT_AMBULATORY_CARE_PROVIDER_SITE_OTHER): Payer: Medicare Other | Admitting: Neurology

## 2013-10-25 VITALS — BP 107/69 | HR 99 | Ht 59.0 in | Wt 126.0 lb

## 2013-10-25 DIAGNOSIS — M545 Low back pain, unspecified: Secondary | ICD-10-CM

## 2013-10-25 DIAGNOSIS — G43009 Migraine without aura, not intractable, without status migrainosus: Secondary | ICD-10-CM

## 2013-10-25 MED ORDER — CYCLOBENZAPRINE HCL 5 MG PO TABS: 5.0000 mg | ORAL_TABLET | Freq: Three times a day (TID) | ORAL | Status: DC | PRN

## 2013-10-25 NOTE — Progress Notes (Signed)
[facility]  PATIENT: Jill Salinas DOB: 28-Dec-1945   HISTORY OF PRESENT ILLNESS: Jill Salinas, 68 year old female, last visit was with Jill Salinas in March 2015 for migraine.  She has migraines for many many years currently well controlled on 200 mg of Topamax at night. She also takes gabapentin 900mg  daily, which has helped her migraines as well. She uses Imitrex acutely both the injectable and the oral preparation. She thinks her headaches are in good control. She used imitrex po first, if that does not take care of her headaches, she would use injections.  This an early visit for her complains of 2 months history of right side neck pain, right occipital area pain, it bothers her 2/week, lasting all day, she felt spacy dealing with the pain, she does not like to drive,   She also had trouble walking, do not have the strength to get up an even steps, xone year, she has no incontinence, more so on the left leg, no involvement of left arm, she also has left knee swelling, felt so bad. She had 30 Lb weight gain over one year "can not get into any of my cloth"  She also complains of low back pain, radiating pain to her left leg, has tried physical therapy recently, which has been helpful, no incontinence.   UPDATE June 24th 2015: She came in for electrodiagnostic study today, there was no evidence of left lumbar radiculopathy, there was evidence of right triceps irritation, but no neuropathic changes at other selected right upper extremity needle examination.  We have reviewed MRI lumbar film together, only mild degenerative disc disease, there was no significant foraminal, or canal stenosis   She is taking Cymbalta now, which has helped her some, but continued complaint bilateral lower extremity deep achy pain, especially at nighttime, she no longer has shooting pain from her neck to her shoulders, or arms, but she complains of deep achy pain in her midline upper cervical  region,  She also complains fatigued easily, lack of stamina  UPDATE September 10 2013:  Her main concern is her constant midline low back pain, there is no radiating pain, but she complains of bilateral lower extremity heaviness, burning discomfort sometimes, she also complains of generalized fatigue, lack of stamina,  She denies significant neck pain, no bowel bladder incontinence, have reviewed MRI of cervical spine, C3-4: left facet hypertrophy with severe left foraminal stenosis.  Mild disc bulging at C3-4, C5-6, 6-7. There was no evidence of central canal stenosis  UPDATE Sept 14th 2015: She had left lumbar epidural injection, by interventional radiologist Dr. Lawrence Salinas at left L4-5 space, in August 24th 2015, she only had transient improvement,   Now she complains of left lower extremity pain, bilateral lower extremity deep achy pain, feet paresthesia, gait difficulty,  REVIEW OF SYSTEMS: Full 14 system review of systems performed and notable only for those listed, all others are neg:   Fatigue, joint pain, joint swelling, weakness.     ALLERGIES: Allergies  Allergen Reactions  . Depakote [Divalproex Sodium] Nausea And Vomiting  . Iodinated Diagnostic Agents Other (See Comments)    Tthroat itching, pt received a 13 hour prep for injection    HOME MEDICATIONS: Outpatient Prescriptions Prior to Visit  Medication Sig Dispense Refill  . Denosumab (PROLIA Lillie) Inject into the skin.      . DULoxetine (CYMBALTA) 60 MG capsule 30 mg 2 (two) times daily.      Marland Kitchen gabapentin (NEURONTIN) 300 MG capsule 1  po daily and 2 at bedtime  90 capsule  8  . HYDROcodone-acetaminophen (NORCO) 10-325 MG per tablet 2 tablets every 4 (four) hours as needed.       . promethazine (PHENERGAN) 25 MG tablet Take 25 mg by mouth every 6 (six) hours as needed for nausea.      . SUMAtriptan (IMITREX STATDOSE SYSTEM) 6 MG/0.5ML SOAJ Use as directed for migraine  2 mL  6  . SUMAtriptan (IMITREX) 100 MG tablet  Take 1 tablet (100 mg total) by mouth as needed for migraine or headache. May repeat in 2 hours if headache persists or recurs.  9 tablet  6  . SUMAtriptan (IMITREX) 6 MG/0.5ML SOLN injection Take 6 mg by mouth as needed for headache.       Marland Kitchen SYNTHROID 50 MCG tablet daily.      . temazepam (RESTORIL) 30 MG capsule Take 30 mg by mouth at bedtime as needed for sleep.      Marland Kitchen topiramate (TOPAMAX) 100 MG tablet Take one tablet po in the am and two tabs po in the pm  90 tablet  8  . tretinoin (RETIN-A) 0.025 % cream        PAST MEDICAL HISTORY: Past Medical History  Diagnosis Date  . Headache(784.0)   . Breast cancer   . Migraine     PAST SURGICAL HISTORY: Past Surgical History  Procedure Laterality Date  . Breast lumpectomy    . Total abdominal hysterectomy N/A     FAMILY HISTORY: Family History  Problem Relation Age of Onset  . Heart disease Mother   . Headache Mother   . Heart attack Father     SOCIAL HISTORY: History   Social History  . Marital Status: Single    Spouse Name: Jill Salinas     Number of Children: 0  . Years of Education: Assoc   Occupational History  . Not on file.   Social History Main Topics  . Smoking status: Never Smoker   . Smokeless tobacco: Never Used  . Alcohol Use: Yes     Comment: drinks on the weekend  . Drug Use: No  . Sexual Activity: Not on file   Other Topics Concern  . Not on file   Social History Narrative   Patient lives at home with her husband. Jill Salinas    Patient has an Geophysicist/field seismologist.    Patient has no children.    Patient is right handed.      PHYSICAL EXAM  Filed Vitals:   10/25/13 1143  Height: 4\' 11"  (1.499 m)  Weight: 126 lb (57.153 kg)   Body mass index is 25.44 kg/(m^2).  Generalized: Well developed, in no acute distress  Head: normocephalic and atraumatic,. Oropharynx benign  Neck: Supple, no carotid bruits  Cardiac: Regular rate rhythm, no murmur  Musculoskeletal: No deformity   Neurological  examination   Mentation: Alert oriented to time, place, history taking. Follows all commands speech and language fluent  Cranial nerve II-XII: Pupils were equal round reactive to light extraocular movements were full, visual field were full on confrontational test. Facial sensation and strength were normal. hearing was intact to finger rubbing bilaterally. Uvula tongue midline. head turning and shoulder shrug were normal and symmetric.Tongue protrusion into cheek strength was normal. Motor: normal bulk and tone, full strength in the BUE, BLE, fine finger movements normal, no pronator drift. No focal weakness Coordination: finger-nose-finger, heel-to-shin bilaterally, no dysmetria Reflexes: Brisk and symmetric, plantar responses were flexor bilaterally. Gait and  Station: Rising up from seated position without assistance, normal stance,  moderate stride, good arm swing, smooth turning, able to perform tiptoe, and heel walking without difficulty. Tandem gait is steady  DIAGNOSTIC DATA (LABS, IMAGING, TESTING) -  ASSESSMENT AND PLAN  68 y.o. year old female   with past medical history of headache. She is currently on Topamax 200 mg at night. Now complains of low back pain, radiating pain to her left lower extremity, also complains of neck pain, bilateral shoulder deep achy pain, lack of stamina, bilateral lower extremity deep achy pain, most consistent with musculoskeletal etiology,  Electrodiagnostic study showed no evidence of right lumbar radiculopathy, MRI of lumbar spine only demonstrated mild degenerative disc disease,  MRI of cervical spine showed C3-4: left facet hypertrophy with severe left foraminal stenosis, she denies significant neck pain, there was no significant weakness.  I will refer her to pain management for management of her chronic pain Will also try Flexeril 5 mg 3 times a day,   Marcial Pacas, M.D. [facility] [address] [place],   [address] [phone]

## 2013-10-25 NOTE — Telephone Encounter (Signed)
Patient wants you to call in Muscle relaxer.

## 2013-10-25 NOTE — Telephone Encounter (Signed)
I have called in flexerill 5mg  tid

## 2013-11-04 ENCOUNTER — Ambulatory Visit: Payer: Medicare Other | Admitting: Neurology

## 2013-11-04 ENCOUNTER — Telehealth: Payer: Self-pay | Admitting: Neurology

## 2013-11-04 NOTE — Telephone Encounter (Signed)
Patient calling to make Dr. Krista Blue aware that she has decided to see a pain management specialist in [place] since it's closer to her. If more questions, please call.

## 2013-11-18 ENCOUNTER — Telehealth: Payer: Self-pay | Admitting: Neurology

## 2013-11-18 NOTE — Telephone Encounter (Signed)
Patient wanted Dr. Krista Blue to know that she has decided not to go to Pain Management as discussed.  FYI

## 2013-12-29 ENCOUNTER — Encounter: Payer: Self-pay | Admitting: Neurology

## 2014-01-04 ENCOUNTER — Encounter: Payer: Self-pay | Admitting: Neurology

## 2014-01-26 ENCOUNTER — Telehealth: Payer: Self-pay | Admitting: Neurology

## 2014-01-26 NOTE — Telephone Encounter (Signed)
Patient stated she's taking Rx topiramate (TOPAMAX) 100 MG tablet 1 tab in am and 2 tablets at pm, but medication isn't helping with pain.  Questioning if pain management could give her injection at base of skull due to pain at back of head.  Please call and advise

## 2014-01-26 NOTE — Telephone Encounter (Signed)
Dana:  please call patient, make sure she follow-up with her pain management

## 2014-01-27 NOTE — Telephone Encounter (Signed)
Called patient and spoke with her and stated to her please keep her appt. With pain mgt. Patient stated she would and she understood this process.

## 2014-03-23 ENCOUNTER — Other Ambulatory Visit: Payer: Self-pay | Admitting: Nurse Practitioner

## 2014-03-23 NOTE — Telephone Encounter (Signed)
Dose per note from 04/20

## 2014-05-12 ENCOUNTER — Other Ambulatory Visit: Payer: Self-pay | Admitting: Nurse Practitioner

## 2014-05-31 ENCOUNTER — Other Ambulatory Visit: Payer: Self-pay | Admitting: Neurology

## 2014-05-31 ENCOUNTER — Telehealth: Payer: Self-pay | Admitting: Neurology

## 2014-05-31 NOTE — Telephone Encounter (Signed)
Jill Salinas, give her a follow-up appointment, we will may do nerve block, IV infusion as abortive treatment, will also go over her medication at follow-up visit

## 2014-05-31 NOTE — Telephone Encounter (Signed)
Patient is calling as she is having extreme migraines.  She is now taking topamax 100 mg, 1/day and 2/night.  Also takes imitrex 6 mg/0.31ml.  Can you give her something for the next 2 wees that will not make her sleepy. She sees her pain management doctor, Dr. Blanch Media on May 16th.  Please call.

## 2014-05-31 NOTE — Telephone Encounter (Signed)
Offered her multiple appointments - she decided on 06/06/14.

## 2014-06-06 ENCOUNTER — Encounter: Payer: Self-pay | Admitting: Neurology

## 2014-06-06 ENCOUNTER — Ambulatory Visit (INDEPENDENT_AMBULATORY_CARE_PROVIDER_SITE_OTHER): Payer: Medicare Other | Admitting: Neurology

## 2014-06-06 VITALS — BP 139/90 | HR 101 | Ht 59.0 in | Wt 131.0 lb

## 2014-06-06 DIAGNOSIS — M542 Cervicalgia: Secondary | ICD-10-CM

## 2014-06-06 DIAGNOSIS — G43009 Migraine without aura, not intractable, without status migrainosus: Secondary | ICD-10-CM | POA: Diagnosis not present

## 2014-06-06 DIAGNOSIS — M545 Low back pain: Secondary | ICD-10-CM | POA: Diagnosis not present

## 2014-06-06 MED ORDER — CELECOXIB 100 MG PO CAPS: 100.0000 mg | ORAL_CAPSULE | Freq: Two times a day (BID) | ORAL | Status: DC

## 2014-06-06 NOTE — Progress Notes (Signed)
[facility]  PATIENT: Jill Salinas DOB: 27-Nov-1945   HISTORY OF PRESENT ILLNESS: Jill Salinas, 69 year old female, last visit was with [name] in March 2015 for migraine.  She has migraines for many many years currently well controlled on 200 mg of Topamax at night. She also takes gabapentin 900mg  daily, which has helped her migraines as well. She uses Imitrex acutely both the injectable and the oral preparation. She thinks her headaches are in good control. She used imitrex po first, if that does not take care of her headaches, she would use injections.  This an early visit for her complains of 2 months history of right side neck pain, right occipital area pain, it bothers her 2/week, lasting all day, she felt spacy dealing with the pain, she does not like to drive,   She also had trouble walking, do not have the strength to get up an even steps, xone year, she has no incontinence, more so on the left leg, no involvement of left arm, she also has left knee swelling, felt so bad. She had 30 Lb weight gain over one year "can not get into any of my cloth"  She also complains of low back pain, radiating pain to her left leg, has tried physical therapy recently, which has been helpful, no incontinence.   UPDATE June 24th 2015: EMG/NCS in June 24th 2015 showed no evidence of left lumbar radiculopathy, there was evidence of right triceps irritation, but no neuropathic changes at other selected right upper extremity needle examination.  We have reviewed MRI lumbar film together, only mild degenerative disc disease, there was no significant foraminal, or canal stenosis   She is taking Cymbalta now, which has helped her some, but continued complaint bilateral lower extremity deep achy pain, especially at nighttime, she no longer has shooting pain from her neck to her shoulders, or arms, but she complains of deep achy pain in her midline upper cervical region,  She also complains  fatigued easily, lack of stamina  UPDATE September 10 2013:  Her main concern is her constant midline low back pain, there is no radiating pain, but she complains of bilateral lower extremity heaviness, burning discomfort sometimes, she also complains of generalized fatigue, lack of stamina,  She denies significant neck pain, no bowel bladder incontinence, have reviewed MRI of cervical spine, C3-4: left facet hypertrophy with severe left foraminal stenosis.  Mild disc bulging at C3-4, C5-6, 6-7. There was no evidence of central canal stenosis  UPDATE Sept 14th 2015: She had left lumbar epidural injection, by interventional radiologist Dr. Lawrence Santiago at left L4-5 space, in August 24th 2015, she only had transient improvement,   Now she complains of left lower extremity pain, bilateral lower extremity deep achy pain, feet paresthesia, gait difficulty,  UPDATE April 25th 2016: She complains of two months history of neck pain, spreading forward to bilateral retrorbital area, daily, can go up to 10/10, radiating to her right shoulder, woke up at night with headaches,has been using frequent Imitrex tablet, sometimes injection  She has history of migraine all her life, previous headaches is lateralized severe pounding headache, now it is at her neck,  She tries to sleep different ways,Change pillows without helping  She is under pain management for her low back pain,is taking daily hydrocodone, tried Elavil before, complains of dizziness, weight gain.  REVIEW OF SYSTEMS: Full 14 system review of systems performed and notable only for those listed, all others are neg:   Fatigue, joint  pain, joint swelling, weakness.     ALLERGIES: Allergies  Allergen Reactions  . Depakote [Divalproex Sodium] Nausea And Vomiting  . Iodinated Diagnostic Agents Other (See Comments)    Tthroat itching, pt received a 13 hour prep for injection    HOME MEDICATIONS: Outpatient Prescriptions Prior to Visit    Medication Sig Dispense Refill  . Denosumab (PROLIA Misenheimer) Inject into the skin.      . DULoxetine (CYMBALTA) 60 MG capsule 30 mg 2 (two) times daily.      Marland Kitchen gabapentin (NEURONTIN) 300 MG capsule 1 po daily and 2 at bedtime  90 capsule  8  . HYDROcodone-acetaminophen (NORCO) 10-325 MG per tablet 2 tablets every 4 (four) hours as needed.       . promethazine (PHENERGAN) 25 MG tablet Take 25 mg by mouth every 6 (six) hours as needed for nausea.      . SUMAtriptan (IMITREX STATDOSE SYSTEM) 6 MG/0.5ML SOAJ Use as directed for migraine  2 mL  6  . SUMAtriptan (IMITREX) 100 MG tablet Take 1 tablet (100 mg total) by mouth as needed for migraine or headache. May repeat in 2 hours if headache persists or recurs.  9 tablet  6  . SUMAtriptan (IMITREX) 6 MG/0.5ML SOLN injection Take 6 mg by mouth as needed for headache.       Marland Kitchen SYNTHROID 50 MCG tablet daily.      . temazepam (RESTORIL) 30 MG capsule Take 30 mg by mouth at bedtime as needed for sleep.      Marland Kitchen topiramate (TOPAMAX) 100 MG tablet Take one tablet po in the am and two tabs po in the pm  90 tablet  8  . tretinoin (RETIN-A) 0.025 % cream        PAST MEDICAL HISTORY: Past Medical History  Diagnosis Date  . Headache(784.0)   . Breast cancer   . Migraine     PAST SURGICAL HISTORY: Past Surgical History  Procedure Laterality Date  . Breast lumpectomy    . Total abdominal hysterectomy N/A     FAMILY HISTORY: Family History  Problem Relation Age of Onset  . Heart disease Mother   . Headache Mother   . Heart attack Father     SOCIAL HISTORY: History   Social History  . Marital Status: Single    Spouse Name: Gwyndolyn Saxon   . Number of Children: 0  . Years of Education: Assoc   Occupational History  . Not on file.   Social History Main Topics  . Smoking status: Never Smoker   . Smokeless tobacco: Never Used  . Alcohol Use: Yes     Comment: drinks on the weekend  . Drug Use: No  . Sexual Activity: Not on file   Other Topics  Concern  . Not on file   Social History Narrative   Patient lives at home with her husband. Gwyndolyn Saxon    Patient has an Geophysicist/field seismologist.    Patient has no children.    Patient is right handed.      PHYSICAL EXAM  Filed Vitals:   06/06/14 1224  BP: 139/90  Pulse: 101  Height: 4\' 11"  (1.499 m)  Weight: 131 lb (59.421 kg)   Body mass index is 26.44 kg/(m^2).  PHYSICAL EXAMNIATION:  Gen: NAD, conversant, well nourised, obese, well groomed                     Cardiovascular: Regular rate rhythm, no peripheral edema, warm, nontender. Eyes: Conjunctivae  clear without exudates or hemorrhage Neck: Supple, no carotid bruise. Pulmonary: Clear to auscultation bilaterally   NEUROLOGICAL EXAM:  MENTAL STATUS: Speech:    Speech is normal; fluent and spontaneous with normal comprehension.  Cognition:    The patient is oriented to person, place, and time;     recent and remote memory intact;     language fluent;     normal attention, concentration,     fund of knowledge.  CRANIAL NERVES: CN II: Visual fields are full to confrontation. Fundoscopic exam is normal with sharp discs and no vascular changes. Venous pulsations are present bilaterally. Pupils are 4 mm and briskly reactive to light. Visual acuity is 20/20 bilaterally. CN III, IV, VI: extraocular movement are normal. No ptosis. CN V: Facial sensation is intact to pinprick in all 3 divisions bilaterally. Corneal responses are intact.  CN VII: Face is symmetric with normal eye closure and smile. CN VIII: Hearing is normal to rubbing fingers CN IX, X: Palate elevates symmetrically. Phonation is normal. CN XI: Head turning and shoulder shrug are intact CN XII: Tongue is midline with normal movements and no atrophy.  MOTOR: There is no pronator drift of out-stretched arms. Muscle bulk and tone are normal. Muscle strength is normal.   Shoulder abduction Shoulder external rotation Elbow flexion Elbow extension Wrist flexion  Wrist extension Finger abduction Hip flexion Knee flexion Knee extension Ankle dorsi flexion Ankle plantar flexion  R 5 5 5 5 5 5 5 5 5 5 5 5   L 5 5 5 5 5 5 5 5 5 5 5 5     REFLEXES: Reflexes are 2+ and symmetric at the biceps, triceps, knees, and ankles. Plantar responses are flexor.  SENSORY: Light touch, pinprick, position sense, and vibration sense are intact in fingers and toes.  COORDINATION: Rapid alternating movements and fine finger movements are intact. There is no dysmetria on finger-to-nose and heel-knee-shin. There are no abnormal or extraneous movements.   GAIT/STANCE: Posture is normal. Gait is steady with normal steps, base, arm swing, and turning. Heel and toe walking are normal. Tandem gait is normal.  Romberg is absent.  DIAGNOSTIC DATA (LABS, IMAGING, TESTING) -  ASSESSMENT AND PLAN  69 y.o. year old female   with past medical history of headache. She is currently on Topamax 200 mg at night, chronic low back pain, radiating pain to her left lower extremity, also complains of neck pain, bilateral shoulder deep achy pain, frequent headaches, we have reviewed MRI cervical, mild multilevel degenerative disc disease, there was no significant foraminal, or canal stenosis.  1. Migraine, cervicogenic tension headaches 2, continue Imitrex as needed,hot compression, neck stretching exercise, physical therapy refer 3. Return to clinic with [name] in 4 months   Marcial Pacas, M.D. [facility] [address], [place], [place] [address] [phone]

## 2014-08-04 ENCOUNTER — Telehealth: Payer: Self-pay | Admitting: Neurology

## 2014-08-04 NOTE — Telephone Encounter (Signed)
Says she has been having the symptoms mentioned below for the last couple of months.  She read the medication insert today and noticed these were listed side effects of Celebrex.  She is wondering if she should stop the medication.  She is given steroid injections and hydrocodone by her pain management doctor.

## 2014-08-04 NOTE — Telephone Encounter (Signed)
She is aware of the physician's response and agreeable to plan.

## 2014-08-04 NOTE — Telephone Encounter (Signed)
Chart reviewed, patient tends to have constellation of complaints, last visit was April 2016 for neck pain, low back pain, headaches, she is also under pain management,    If she continue to feel that Celebrex is helping her, she may take it as needed basis, if Celebrex does not help her, she may stop it

## 2014-08-04 NOTE — Telephone Encounter (Signed)
Patient is calling because she is having problems since taking celecoxib (CELEBREX) 100 MG capsule. Patient has gained weight, swelling in her hands,feet and legs and warmth in her groin area. Please call patient to discuss. Thank you.

## 2014-09-14 ENCOUNTER — Telehealth: Payer: Self-pay | Admitting: Neurology

## 2014-09-14 NOTE — Telephone Encounter (Signed)
Would like an alternative to topiramate (TOPAMAX) 100 MG tablet. Pt states she is sleeping too much on Topamax. Call cell [phone] if can't reach on landline.

## 2014-09-14 NOTE — Telephone Encounter (Signed)
Spoke to [place] - her follow up is 09/19/14 - she would like to discuss an alternate medication at her appointment.  She is currently taking Topamax 100mg , one tab in the morning and two tabs in the evening.  Feels this dose is causing intolerable daytime somnolence and she is sleeping too much.  Also, she is still having daily headaches that vary in severity.  She estimates at least one migraine weekly that typically responds well to sumatriptan (she often has to repeat the dose in two hours).

## 2014-09-14 NOTE — Telephone Encounter (Signed)
noted 

## 2014-09-15 NOTE — Telephone Encounter (Signed)
She will try this dosing schedule for a few weeks.  Appointment has been moved back to the end of August.

## 2014-09-15 NOTE — Telephone Encounter (Addendum)
Pt called and says that she had some time to think about her conversation about her medication. She thinks that she is taking the medication wrong. She was taking one in the morning and then taking 2 pill in the afternoon, not the evening. Feels that this could be the reason she is so sleepy. Last night she took 2 pill before bedtime and one this morning and feels good so far. Please call and advise [phone]

## 2014-09-19 ENCOUNTER — Ambulatory Visit: Payer: Self-pay | Admitting: Nurse Practitioner

## 2014-10-11 ENCOUNTER — Ambulatory Visit: Payer: Medicare Other | Admitting: Nurse Practitioner

## 2014-10-12 ENCOUNTER — Ambulatory Visit: Payer: Self-pay | Admitting: Nurse Practitioner

## 2014-10-12 ENCOUNTER — Ambulatory Visit: Payer: Medicare Other | Admitting: Nurse Practitioner

## 2014-10-31 ENCOUNTER — Ambulatory Visit: Payer: Medicare Other | Admitting: Nurse Practitioner

## 2014-11-07 ENCOUNTER — Ambulatory Visit: Payer: Medicare Other | Admitting: Nurse Practitioner

## 2014-11-17 ENCOUNTER — Other Ambulatory Visit: Payer: Self-pay | Admitting: Physician Assistant

## 2014-11-17 ENCOUNTER — Encounter: Payer: Self-pay | Admitting: Nurse Practitioner

## 2014-11-17 ENCOUNTER — Ambulatory Visit
Admission: RE | Admit: 2014-11-17 | Discharge: 2014-11-17 | Disposition: A | Discharge status: Home / Self Care | Payer: Medicare Other | Source: Ambulatory Visit | Attending: Physician Assistant | Admitting: Physician Assistant

## 2014-11-17 ENCOUNTER — Ambulatory Visit (INDEPENDENT_AMBULATORY_CARE_PROVIDER_SITE_OTHER): Payer: Medicare Other | Admitting: Nurse Practitioner

## 2014-11-17 ENCOUNTER — Telehealth: Payer: Self-pay | Admitting: *Deleted

## 2014-11-17 VITALS — BP 122/78 | HR 92 | Ht 60.0 in | Wt 135.6 lb

## 2014-11-17 DIAGNOSIS — M549 Dorsalgia, unspecified: Secondary | ICD-10-CM

## 2014-11-17 DIAGNOSIS — G43009 Migraine without aura, not intractable, without status migrainosus: Secondary | ICD-10-CM | POA: Diagnosis not present

## 2014-11-17 DIAGNOSIS — R309 Painful micturition, unspecified: Secondary | ICD-10-CM

## 2014-11-17 IMAGING — CR DG ABDOMEN 1V
1 series · 1 of 1 positions shown · non-contrast
Comparison: None.

CLINICAL DATA: Back and abdominal pain for 3 months

EXAM:
ABDOMEN - 1 VIEW

[w abdomen upright *]
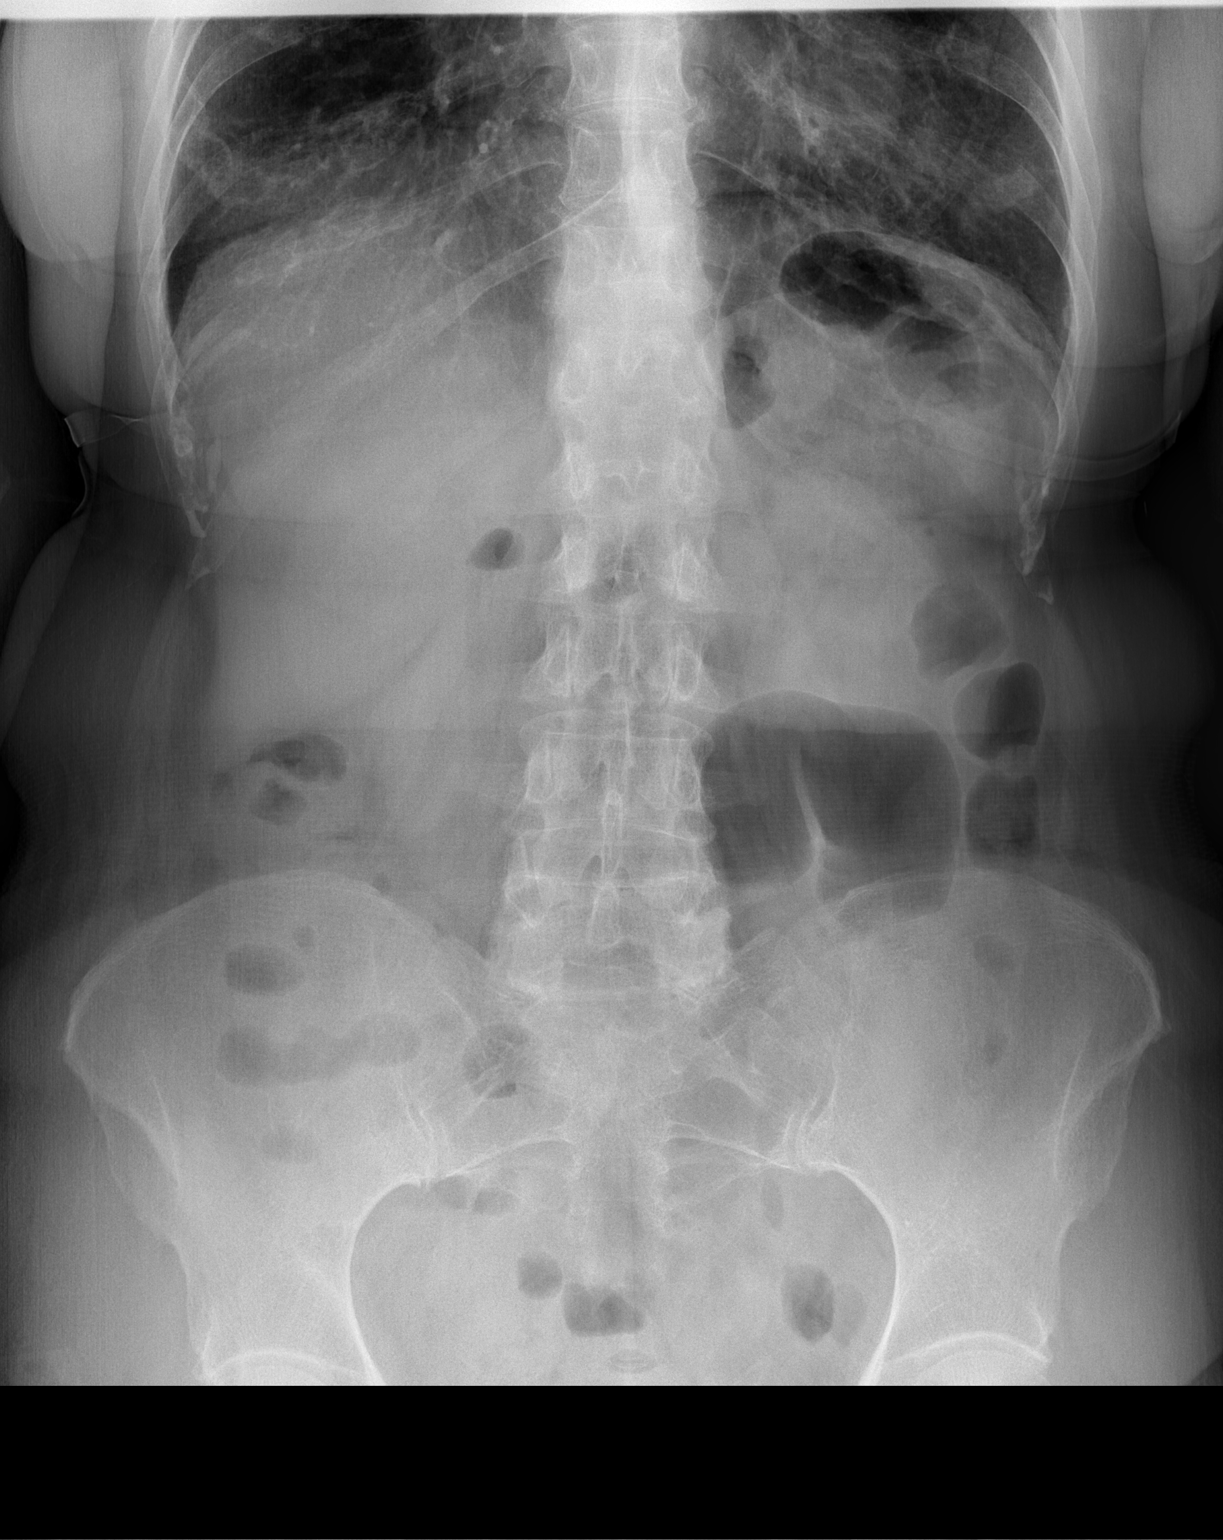

[1 of 1 positions shown; findings below may reference images not displayed]

FINDINGS: Scattered large and small bowel gas is noted. No obstructive changes
are seen. No free air is noted. No acute bony abnormality is seen.
Some interstitial changes are noted in the lung bases bilaterally.
IMPRESSION: No acute abnormality seen.

## 2014-11-17 NOTE — Telephone Encounter (Signed)
I called pt and asked if she could come in 1415-1430 for appt that is already scheduled at 1500. Please to call us back and let us know.    Called home and spoke to husband,  She is in [place] for this appt.

## 2014-11-17 NOTE — Patient Instructions (Signed)
Continue Topamax at current dose Continue Imitrex at current dose F/U in 6 months

## 2014-11-17 NOTE — Progress Notes (Signed)
I have reviewed and agreed above plan. 

## 2014-11-17 NOTE — Progress Notes (Signed)
[facility]  PATIENT: Jill Salinas DOB: 1945/12/08   REASON FOR VISIT: Follow-up for migraine , neck pain HISTORY FROM: Patient    HISTORY OF PRESENT ILLNESS: Ms. Jill Salinas, 69 year old female returns for follow-up. She has a long history of migraines and is currently on Topamax. She takes Imitrex acutely but her headaches are in good control at present. She also has a history of neck pain and is doing neck exercises. She gets no regular exercise however by walking and she has been encouraged to do that. She has had a 30 pound weight gain since I saw her a year ago. She returns for reevaluation     HISTORY YYMs. Jill Salinas, 69 year old female, last visit was with [name] in March 2015 for migraine. She has migraines for many many years currently well controlled on 200 mg of Topamax at night. She also takes gabapentin 900mg  daily, which has helped her migraines as well. She uses Imitrex acutely both the injectable and the oral preparation. She thinks her headaches are in good control. She used imitrex po first, if that does not take care of her headaches, she would use injections.  This an early visit for her complains of 2 months history of right side neck pain, right occipital area pain, it bothers her 2/week, lasting all day, she felt spacy dealing with the pain, she does not like to drive,  She also had trouble walking, do not have the strength to get up an even steps, xone year, she has no incontinence, more so on the left leg, no involvement of left arm, she also has left knee swelling, felt so bad. She had 30 Lb weight gain over one year "can not get into any of my cloth" She also complains of low back pain, radiating pain to her left leg, has tried physical therapy recently, which has been helpful, no incontinence.   UPDATE June 24th 2015: EMG/NCS in June 24th 2015 showed no evidence of left lumbar radiculopathy, there was evidence of right triceps irritation, but no  neuropathic changes at other selected right upper extremity needle examination.  We have reviewed MRI lumbar film together, only mild degenerative disc disease, there was no significant foraminal, or canal stenosis   She is taking Cymbalta now, which has helped her some, but continued complaint bilateral lower extremity deep achy pain, especially at nighttime, she no longer has shooting pain from her neck to her shoulders, or arms, but she complains of deep achy pain in her midline upper cervical region,  She also complains fatigued easily, lack of stamina She had left lumbar epidural injection, by interventional radiologist Dr. Lawrence Santiago at left L4-5 space, in August 24th 2015, she only had transient improvement,  Now she complains of left lower extremity pain, bilateral lower extremity deep achy pain, feet paresthesia, gait difficulty,  UPDATE April 25th 2016: She complains of two months history of neck pain, spreading forward to bilateral retrorbital area, daily, can go up to 10/10, radiating to her right shoulder, woke up at night with headaches,has been using frequent Imitrex tablet, sometimes injection  She has history of migraine all her life, previous headaches is lateralized severe pounding headache, now it is at her neck, She tries to sleep different ways,Change pillows without helping  She is under pain management for her low back pain,is taking daily hydrocodone, tried Elavil before, complains of dizziness, weight gain.    REVIEW OF SYSTEMS: Full 14 system review of systems performed and notable only for  those listed, all others are neg:  Constitutional: neg  Cardiovascular: neg Ear/Nose/Throat: neg  Skin: neg Eyes: neg Respiratory: neg Gastroitestinal: neg  Hematology/Lymphatic: neg  Endocrine: neg Musculoskeletal:neg Allergy/Immunology: neg Neurological: neg Psychiatric: neg Sleep : neg   ALLERGIES: Allergies  Allergen Reactions  . Depakote [Divalproex  Sodium] Nausea And Vomiting  . Iodinated Diagnostic Agents Other (See Comments)    Tthroat itching, pt received a 13 hour prep for injection    HOME MEDICATIONS: Outpatient Prescriptions Prior to Visit  Medication Sig Dispense Refill  . AMITIZA 24 MCG capsule 2 (two) times daily.    . celecoxib (CELEBREX) 100 MG capsule Take 1 capsule (100 mg total) by mouth 2 (two) times daily. 60 capsule 6  . DULoxetine (CYMBALTA) 60 MG capsule 30 mg 2 (two) times daily.    . SUMAtriptan (IMITREX) 100 MG tablet TAKE ONE TABLET BY MOUTH AS NEEDED FOR  MIGRAINE  OR  HEADACHE  MAY  REPEAT  IN  2  HOURS  IF  HEADACHE  PERSISTS  OR  RECURS 9 tablet 3  . SUMAtriptan 6 MG/0.5ML SOAJ USE AS DIRECTED FOR MIGRAINE 2 Syringe 3  . SYNTHROID 50 MCG tablet 75 mcg daily.     . temazepam (RESTORIL) 30 MG capsule Take 30 mg by mouth at bedtime as needed for sleep.    Marland Kitchen topiramate (TOPAMAX) 100 MG tablet TAKE ONE TABLET BY MOUTH IN THE MORNING AND TWO IN THE EVENING 90 tablet 6  . ZIOPTAN 0.0015 % SOLN     . HYDROcodone-acetaminophen (NORCO) 10-325 MG per tablet 2 tablets every 4 (four) hours as needed.      No facility-administered medications prior to visit.    PAST MEDICAL HISTORY: Past Medical History  Diagnosis Date  . Headache(784.0)   . Breast cancer ([place])   . Migraine     PAST SURGICAL HISTORY: Past Surgical History  Procedure Laterality Date  . Breast lumpectomy    . Total abdominal hysterectomy N/A     FAMILY HISTORY: Family History  Problem Relation Age of Onset  . Heart disease Mother   . Headache Mother   . Heart attack Father     SOCIAL HISTORY: Social History   Social History  . Marital Status: Single    Spouse Name: Jill Salinas   . Number of Children: 0  . Years of Education: Assoc   Occupational History  . Not on file.   Social History Main Topics  . Smoking status: Never Smoker   . Smokeless tobacco: Never Used  . Alcohol Use: Yes     Comment: drinks on the weekend  . Drug  Use: No  . Sexual Activity: Not on file   Other Topics Concern  . Not on file   Social History Narrative   Patient lives at home with her husband. Jill Salinas    Patient has an Geophysicist/field seismologist.    Patient has no children.    Patient is right handed.      PHYSICAL EXAM  Filed Vitals:   11/17/14 1448  BP: 122/78  Pulse: 92  Height: 5' (1.524 m)  Weight: 135 lb 9.6 oz (61.508 kg)   Body mass index is 26.48 kg/(m^2).  Generalized: Well developed, obese female in no acute distress  Head: normocephalic and atraumatic,. Oropharynx benign  Neck: Supple, no carotid bruits  Cardiac: Regular rate rhythm, no murmur  Musculoskeletal: No deformity   Neurological examination   Mentation: Alert oriented to time, place, history taking. Attention span and  concentration appropriate. Recent and remote memory intact.  Follows all commands speech and language fluent.   Cranial nerve II-XII: Fundoscopic exam reveals sharp disc margins.Pupils were equal round reactive to light extraocular movements were full, visual field were full on confrontational test. Facial sensation and strength were normal. hearing was intact to finger rubbing bilaterally. Uvula tongue midline. head turning and shoulder shrug were normal and symmetric.Tongue protrusion into cheek strength was normal. Motor: normal bulk and tone, full strength in the BUE, BLE, fine finger movements normal, no pronator drift. No focal weakness Sensory: normal and symmetric to light touch, pinprick, and  Vibration, proprioception  Coordination: finger-nose-finger, heel-to-shin bilaterally, no dysmetria Reflexes: Brachioradialis 2/2, biceps 2/2, triceps 2/2, patellar 2/2, Achilles 2/2, plantar responses were flexor bilaterally. Gait and Station: Rising up from seated position without assistance, normal stance,  moderate stride, good arm swing, smooth turning, able to perform tiptoe, and heel walking without difficulty. Tandem gait is  steady  DIAGNOSTIC DATA (LABS, IMAGING, TESTING) -ASSESSMENT AND PLAN  69 y.o. year old female  with past medical history of headache. She is currently on Topamax 100 mg am and 200mg  at night, chronic low back pain, radiating pain to her left lower extremity, also complains of neck pain, bilateral shoulder deep achy pain.   Continue Topamax at current dose for now patient is scheduled for KUB questionable kidney stone Continue Imitrex at current dose F/U in 6 months [name], [facility], [facility], [facility] [address], [place], [place] [address] [phone]

## 2014-11-17 NOTE — Telephone Encounter (Signed)
Pt returned call and was about 15 min away.  Can be here by 1430.

## 2014-12-20 ENCOUNTER — Other Ambulatory Visit: Payer: Self-pay | Admitting: Neurology

## 2015-01-11 ENCOUNTER — Telehealth: Payer: Self-pay | Admitting: Nurse Practitioner

## 2015-01-11 NOTE — Telephone Encounter (Signed)
Patient is calling because she wants to discuss medication topiramate (TOPAMAX) 100 MG tablet. She states the medication makes her feel extremely tired and is affecting her memory. Is there another medication she can take? Please call to discuss. Thank you.

## 2015-01-11 NOTE — Telephone Encounter (Signed)
I called pt, She does not have results of KUB.  She will find out tomorrow and let us know.  She has chronic daily headaches (of some sort).  She states that she would wake up with them,some other sx include scalp soreness, ear, teeth soreness.  Her headaches that she feels like will turn into migraines (she has taken only 2 tabs of imitrex since last seen.  She states that topamax has always made her feel fatigued, feels like a 'zombie", her memory is affected.  I told her that with botox, 15 migraines in a month is criteria for botox .    She mentioned XR topamax?  She will call us back.

## 2015-01-11 NOTE — Telephone Encounter (Signed)
This patient was just seen in Oct 07/2014. Migraines good at that time. She has been on Elavil, VPA, Relpax, Gabapentin, Cymbalta and Flexeril. When last seen she was suppose to have KUB. ? Kidney stone. Please find out the results. Also I dont think she is having enough headaches for Botox. ? How many in the last month.

## 2015-01-11 NOTE — Telephone Encounter (Addendum)
It appears the patient has been taking Topamax for over 2 years.  I called back to clarify.  Spoke with patient at great length who said she has been having problems on and off for a while.  Says today is "a good day" but sometimes she feels in a fog, has fatigue, no motivation and at times has problems with memory/concentration.  States she's had a few headaches since last OV, some were severe.  Indicates she takes her meds twice daily, but not necessarily at the same time each day.  She is interested in a possible drug change.  Says she saw a commercial for Botox, and wasn't sure if she would be a candidate for that or not.  Denies starting any new meds, or having changes in any other meds she is taking, feels Topamax is the cause of her symptoms.  Please advise.  Thank you.   Also said she forgot to mention two episodes she had about one and a half years ago. And would like them relayed to [place].  Says she laid down in bed and felt like she was sinking into the blanket, all of the threads in the blanket were changing colors, and she felt euphoric.  The second time she was at the hospital with her father and all of the walls were being "painted different colors" and she was decorating his room with lace.  Says she was not alarmed and knew this was all in her head.  Denies any further occurences of this kind since that time.

## 2015-01-12 NOTE — Telephone Encounter (Signed)
I called and LM with husband at home that I called back, just following up.   He will give her the message.

## 2015-01-16 ENCOUNTER — Other Ambulatory Visit: Payer: Self-pay

## 2015-01-16 MED ORDER — TOPIRAMATE ER 200 MG PO CAP24: 200.0000 mg | ORAL_CAPSULE | Freq: Every day | ORAL | Status: DC

## 2015-01-16 NOTE — Telephone Encounter (Signed)
Pt is fine and very appreciative to try and see if this makes a difference.

## 2015-01-16 NOTE — Telephone Encounter (Signed)
Patient is returning your call.  Please call [phone].  Thanks!

## 2015-01-16 NOTE — Telephone Encounter (Signed)
Given that she has a history of fibromyagia memory fog is very common. She can be switched to Trokendi XR which has less side effects if she would like.

## 2015-01-16 NOTE — Telephone Encounter (Signed)
RX faxed

## 2015-01-16 NOTE — Telephone Encounter (Signed)
I called pt.   She  Had xray done 11-18-14 after seeing [place] in 11-17-14.  Xray negative (abd).    She feels like she is having increasing fatigue (caused by topamax).  Since last seen daily headaches, but only 2 migraines/month since last seen.  She has seen her Dr. Derrill Kay, endocrinologist who is in [place].  Do you want to see her again?

## 2015-01-18 NOTE — Telephone Encounter (Signed)
Patient is calling to advise that the Topiramate ER (TROKENDI XR) 200 MG CP24 prescribed is too expensive and she will not be taking it but appreciates all you have done.Thank you.

## 2015-02-06 ENCOUNTER — Other Ambulatory Visit: Payer: Self-pay | Admitting: Neurology

## 2015-02-23 ENCOUNTER — Telehealth: Payer: Self-pay | Admitting: Neurology

## 2015-02-23 NOTE — Telephone Encounter (Signed)
Pt called and has some concern with her medication. She is complaining that her eyes are hurting and having some vision problems. She thinks it may be related to her Topamax. She states that she is still having some break through headaches as well. She is wanting to know if may be she could come in and be seen or have a call from the physician. Please call and advise [phone]

## 2015-02-23 NOTE — Telephone Encounter (Signed)
I spoke to patient and she would like to be seen to discuss medications. She has eye appt on Monday. We made appt with Cecille Rubin NP for Tuesday. Patient was very Patent attorney.

## 2015-02-28 ENCOUNTER — Ambulatory Visit (INDEPENDENT_AMBULATORY_CARE_PROVIDER_SITE_OTHER): Payer: Medicare Other | Admitting: Nurse Practitioner

## 2015-02-28 ENCOUNTER — Encounter: Payer: Self-pay | Admitting: Nurse Practitioner

## 2015-02-28 VITALS — BP 120/73 | HR 95 | Ht 60.0 in | Wt 129.6 lb

## 2015-02-28 DIAGNOSIS — G43009 Migraine without aura, not intractable, without status migrainosus: Secondary | ICD-10-CM

## 2015-02-28 MED ORDER — TOPIRAMATE ER 200 MG PO CAP24: 200.0000 mg | ORAL_CAPSULE | Freq: Every day | ORAL | Status: DC

## 2015-02-28 NOTE — Patient Instructions (Addendum)
Will try trokendi XR 200mg  by mouth RX to patient Continue Imitrex acutely Follow-up in 3-4 months next visit with Dr. Krista Blue

## 2015-02-28 NOTE — Progress Notes (Signed)
[facility]  PATIENT: Jill Salinas DOB: January 15, 1946   REASON FOR VISIT: Follow-up for migraine, neck pain HISTORY FROM: Patient    HISTORY OF PRESENT ILLNESS:Jill Salinas, 70 year old female returns for follow-up. She has a long history of migraines and is currently on Topamax. When last seen 11/17/2014 she was switched to extended release trokendi due to some side effects of Topamax numbness and tingling.  She called back later to say she could not afford the medication however her insurance has changed  and she wants to try the extended release again She takes Imitrex acutely.. She also has a history of neck pain and is doing neck exercises. She has depression. She gets no regular exercise however by walking and she has been encouraged to do that. She has had a 30 pound weight gain. She returns for reevaluation     HISTORY Jill Salinas, 70 year old female, last visit was with [name] in March 2015 for migraine. She has migraines for many many years currently well controlled on 200 mg of Topamax at night. She also takes gabapentin 900mg  daily, which has helped her migraines as well. She uses Imitrex acutely both the injectable and the oral preparation. She thinks her headaches are in good control. She used imitrex po first, if that does not take care of her headaches, she would use injections.  This an early visit for her complains of 2 months history of right side neck pain, right occipital area pain, it bothers her 2/week, lasting all day, she felt spacy dealing with the pain, she does not like to drive,  She also had trouble walking, do not have the strength to get up an even steps, xone year, she has no incontinence, more so on the left leg, no involvement of left arm, she also has left knee swelling, felt so bad. She had 30 Lb weight gain over one year "can not get into any of my cloth" She also complains of low back pain, radiating pain to her left leg, has tried physical  therapy recently, which has been helpful, no incontinence.   UPDATE June 24th 2015: EMG/NCS in June 24th 2015 showed no evidence of left lumbar radiculopathy, there was evidence of right triceps irritation, but no neuropathic changes at other selected right upper extremity needle examination.  We have reviewed MRI lumbar film together, only mild degenerative disc disease, there was no significant foraminal, or canal stenosis   She is taking Cymbalta now, which has helped her some, but continued complaint bilateral lower extremity deep achy pain, especially at nighttime, she no longer has shooting pain from her neck to her shoulders, or arms, but she complains of deep achy pain in her midline upper cervical region,  She also complains fatigued easily, lack of stamina She had left lumbar epidural injection, by interventional radiologist Dr. Lawrence Santiago at left L4-5 space, in August 24th 2015, she only had transient improvement,  Now she complains of left lower extremity pain, bilateral lower extremity deep achy pain, feet paresthesia, gait difficulty,  UPDATE April 25th 2016: She complains of two months history of neck pain, spreading forward to bilateral retrorbital area, daily, can go up to 10/10, radiating to her right shoulder, woke up at night with headaches,has been using frequent Imitrex tablet, sometimes injection  She has history of migraine all her life, previous headaches is lateralized severe pounding headache, now it is at her neck, She tries to sleep different ways,Change pillows without helping  She is under pain  management for her low back pain,is taking daily hydrocodone, tried Elavil before, complains of dizziness, weight gain.     REVIEW OF SYSTEMS: Full 14 system review of systems performed and notable only for those listed, all others are neg:  Constitutional: Fatigue  Cardiovascular: neg Ear/Nose/Throat: neg  Skin: neg Eyes: Light sensitivity Respiratory:  neg Gastroitestinal: Chronic constipation Hematology/Lymphatic: neg  Endocrine: neg Musculoskeletal: Back pain, neck pain Allergy/Immunology: neg Neurological: Headache, numbness Psychiatric: neg Sleep : neg   ALLERGIES: Allergies  Allergen Reactions  . Codeine   . Depakote [Divalproex Sodium] Nausea And Vomiting  . Iodinated Diagnostic Agents Other (See Comments)    Tthroat itching, pt received a 13 hour prep for injection    HOME MEDICATIONS: Outpatient Prescriptions Prior to Visit  Medication Sig Dispense Refill  . DULoxetine (CYMBALTA) 30 MG capsule Take 30 mg by mouth daily.     . DULoxetine (CYMBALTA) 60 MG capsule 60 mg daily.     Marland Kitchen omeprazole (PRILOSEC) 20 MG capsule     . oxyCODONE-acetaminophen (PERCOCET) 7.5-325 MG tablet     . SUMAtriptan (IMITREX) 100 MG tablet TAKE ONE TABLET BY MOUTH AS NEEDED FOR MIGRAINE  OR  HEADACHE  MAY  REPEAT  IN  2  HOURS  IF  HEADACHE  PERSISTS  OR  RECURS 9 tablet 6  . SUMAtriptan 6 MG/0.5ML SOAJ USE AS DIRECTED FOR MIGRAINE 2 Syringe 3  . SYNTHROID 50 MCG tablet 75 mcg daily.     . temazepam (RESTORIL) 30 MG capsule Take 30 mg by mouth at bedtime as needed for sleep.    . Topiramate ER (TROKENDI XR) 200 MG CP24 Take 200 mg by mouth daily. 30 capsule 6  . tretinoin (RETIN-A) 0.025 % cream     . ZIOPTAN 0.0015 % SOLN     . AMITIZA 24 MCG capsule 2 (two) times daily. Reported on 02/28/2015    . celecoxib (CELEBREX) 100 MG capsule Take 1 capsule (100 mg total) by mouth 2 (two) times daily. (Patient not taking: Reported on 02/28/2015) 60 capsule 6   No facility-administered medications prior to visit.    PAST MEDICAL HISTORY: Past Medical History  Diagnosis Date  . Headache(784.0)   . Breast cancer ([place])   . Migraine     PAST SURGICAL HISTORY: Past Surgical History  Procedure Laterality Date  . Breast lumpectomy    . Total abdominal hysterectomy N/A     FAMILY HISTORY: Family History  Problem Relation Age of Onset  . Heart  disease Mother   . Headache Mother   . Heart attack Father     SOCIAL HISTORY: Social History   Social History  . Marital Status: Single    Spouse Name: Gwyndolyn Saxon   . Number of Children: 0  . Years of Education: Assoc   Occupational History  . Not on file.   Social History Main Topics  . Smoking status: Never Smoker   . Smokeless tobacco: Never Used  . Alcohol Use: Yes     Comment: drinks on the weekend  . Drug Use: No  . Sexual Activity: Not on file   Other Topics Concern  . Not on file   Social History Narrative   Patient lives at home with her husband. Gwyndolyn Saxon    Patient has an Geophysicist/field seismologist.    Patient has no children.    Patient is right handed.      PHYSICAL EXAM  Filed Vitals:   02/28/15 1629  BP: 120/73  Pulse:  95  Height: 5' (1.524 m)  Weight: 129 lb 9.6 oz (58.786 kg)   Body mass index is 25.31 kg/(m^2). Generalized: Well developed, obese female in no acute distress  Head: normocephalic and atraumatic,. Oropharynx benign  Neck: Supple, no carotid bruits  Cardiac: Regular rate rhythm, no murmur  Musculoskeletal: No deformity   Neurological examination   Mentation: Alert oriented to time, place, history taking. Attention span and concentration appropriate. Recent and remote memory intact. Follows all commands speech and language fluent.   Cranial nerve II-XII: Pupils were equal round reactive to light extraocular movements were full, visual field were full on confrontational test. Facial sensation and strength were normal. hearing was intact to finger rubbing bilaterally. Uvula tongue midline. head turning and shoulder shrug were normal and symmetric.Tongue protrusion into cheek strength was normal. Motor: normal bulk and tone, full strength in the BUE, BLE, fine finger movements normal, no pronator drift. No focal weakness Sensory: normal and symmetric to light touch, pinprick, and Vibration, Coordination: finger-nose-finger, heel-to-shin  bilaterally, no dysmetria Reflexes: Brachioradialis 2/2, biceps 2/2, triceps 2/2, patellar 2/2, Achilles 2/2, plantar responses were flexor bilaterally. Gait and Station: Rising up from seated position without assistance, normal stance, moderate stride, good arm swing, smooth turning, able to perform tiptoe, and heel walking without difficulty. Tandem gait is steady DIAGNOSTIC DATA (LABS, IMAGING, TESTING) - ASSESSMENT AND PLAN  70 y.o. year old female  has a past medical history of Headache(784.0); Migraine and  long history of chronic pain seen at a pain clinic.   Will try trokendi XR 200mg  by mouth RX to patient Continue Imitrex acutely Follow-up in 3-4 months next visit with Dr. Luan Pulling, [facility], [facility], [facility] [address] [place], [place] [address] [phone]

## 2015-03-01 ENCOUNTER — Encounter: Payer: Self-pay | Admitting: Nurse Practitioner

## 2015-03-14 ENCOUNTER — Telehealth: Payer: Self-pay | Admitting: *Deleted

## 2015-03-14 NOTE — Telephone Encounter (Signed)
Pt taking trokendi xr caps 200mg  po daily.  (this is not on formulary).

## 2015-03-14 NOTE — Telephone Encounter (Signed)
I called and spoke to [place].  She will fax PA form for [place] to fill out.

## 2015-03-17 NOTE — Telephone Encounter (Addendum)
Received PA approval for trokendi xr 200mg  po daily from [facility] Date 03/16/15 thru until further notice). May fill 90 days supply if contracted at [place] except on specialty Tier 5 which only 30 days.  Next refill 03-27-15.   Faxed to [facility] [phone] with confirmation.

## 2015-05-18 ENCOUNTER — Ambulatory Visit: Payer: Medicare Other | Admitting: Nurse Practitioner

## 2015-06-29 ENCOUNTER — Ambulatory Visit: Payer: Medicare Other | Admitting: Neurology

## 2015-08-07 ENCOUNTER — Ambulatory Visit: Payer: Medicare Other | Admitting: Neurology

## 2015-08-14 ENCOUNTER — Ambulatory Visit (INDEPENDENT_AMBULATORY_CARE_PROVIDER_SITE_OTHER): Payer: Medicare Other | Admitting: Neurology

## 2015-08-14 ENCOUNTER — Encounter: Payer: Self-pay | Admitting: Neurology

## 2015-08-14 VITALS — BP 128/77 | HR 72 | Ht 60.0 in | Wt 133.0 lb

## 2015-08-14 DIAGNOSIS — G43009 Migraine without aura, not intractable, without status migrainosus: Secondary | ICD-10-CM | POA: Diagnosis not present

## 2015-08-14 MED ORDER — TOPIRAMATE ER 100 MG PO CAP24: 100.0000 mg | ORAL_CAPSULE | Freq: Every day | ORAL | Status: DC

## 2015-08-14 NOTE — Progress Notes (Signed)
Chief Complaint  Patient presents with  . Migraine    She is only getting one migraine monthly since starting topiramate ER 200mg   daily.  However, she is reporting chronic stomach pain since starting this medication.  Sumatriptan is helpful to resolve her migraines.     [facility]  PATIENT: Jill Salinas DOB: 1945-07-13    HISTORY OF PRESENT ILLNESS:  HISTORY Jill Salinas, 70 year old female, last visit was with [name] in March 2015 for migraine. She has migraines for many many years currently well controlled on 200 mg of Topamax at night. She also takes gabapentin 900mg  daily, which has helped her migraines as well. She uses Imitrex acutely both the injectable and the oral preparation. She thinks her headaches are in good control. She used imitrex po first, if that does not take care of her headaches, she would use injections.  This an early visit for her complains of 2 months history of right side neck pain, right occipital area pain, it bothers her 2/week, lasting all day, she felt spacy dealing with the pain, she does not like to drive,  She also had trouble walking, do not have the strength to get up an even steps, xone year, she has no incontinence, more so on the left leg, no involvement of left arm, she also has left knee swelling, felt so bad. She had 30 Lb weight gain over one year "can not get into any of my cloth" She also complains of low back pain, radiating pain to her left leg, has tried physical therapy recently, which has been helpful, no incontinence.   UPDATE June 24th 2015: EMG/NCS in June 24th 2015 showed no evidence of left lumbar radiculopathy, there was evidence of right triceps irritation, but no neuropathic changes at other selected right upper extremity needle examination.  We have reviewed MRI lumbar film together, only mild degenerative disc disease, there was no significant foraminal, or canal stenosis   She is taking Cymbalta now, which  has helped her some, but continued complaint bilateral lower extremity deep achy pain, especially at nighttime, she no longer has shooting pain from her neck to her shoulders, or arms, but she complains of deep achy pain in her midline upper cervical region,  She also complains fatigued easily, lack of stamina She had left lumbar epidural injection, by interventional radiologist Dr. Lawrence Santiago at left L4-5 space, in August 24th 2015, she only had transient improvement,  Now she complains of left lower extremity pain, bilateral lower extremity deep achy pain, feet paresthesia, gait difficulty,  UPDATE April 25th 2016: She complains of two months history of neck pain, spreading forward to bilateral retrorbital area, daily, can go up to 10/10, radiating to her right shoulder, woke up at night with headaches,has been using frequent Imitrex tablet, sometimes injection  She has history of migraine all her life, previous headaches is lateralized severe pounding headache, now it is at her neck, She tries to sleep different ways,Change pillows without helping  She is under pain management for her low back pain,is taking daily hydrocodone, tried Elavil before, complains of dizziness, weight gain.  Update July third 2017: She has severe constipation, has not used bathroom for 3 months,  She complains of bloating, tried different methods, medications, abdomen massage,  Her migraine has much improved, she has migraines headaches every few times, she is taking imitrex as needed, which has been helpful she is worried about side effect of Trokendi xr, I will decrease the dosage from  200mg  to 100mg  qhs.  REVIEW OF SYSTEMS: Full 14 system review of systems performed and notable only for those listed, all others are neg: Cheek, unexpected weight change, shortness of breath, eye pain, blurry vision, trouble swallowing, chest tightness, chest pain, leg swelling, heat intolerance, excessive eating, swollen abdomen,  abdominal pain, constipation, nausea, vomiting, memory loss, weakness    ALLERGIES: Allergies  Allergen Reactions  . Codeine   . Depakote [Divalproex Sodium] Nausea And Vomiting  . Iodinated Diagnostic Agents Other (See Comments)    Tthroat itching, pt received a 13 hour prep for injection    HOME MEDICATIONS: Outpatient Prescriptions Prior to Visit  Medication Sig Dispense Refill  . denosumab (PROLIA) 60 MG/ML SOLN injection Inject 60 mg into the skin every 6 (six) months. Administer in upper arm, thigh, or abdomen    . DULoxetine (CYMBALTA) 30 MG capsule Take 30 mg by mouth daily.     . DULoxetine (CYMBALTA) 60 MG capsule 60 mg daily.     Marland Kitchen omeprazole (PRILOSEC) 20 MG capsule     . oxyCODONE-acetaminophen (PERCOCET) 7.5-325 MG tablet     . SUMAtriptan (IMITREX) 100 MG tablet TAKE ONE TABLET BY MOUTH AS NEEDED FOR MIGRAINE  OR  HEADACHE  MAY  REPEAT  IN  2  HOURS  IF  HEADACHE  PERSISTS  OR  RECURS 9 tablet 6  . SUMAtriptan 6 MG/0.5ML SOAJ USE AS DIRECTED FOR MIGRAINE 2 Syringe 3  . temazepam (RESTORIL) 30 MG capsule Take 30 mg by mouth at bedtime as needed for sleep.    . Topiramate ER (TROKENDI XR) 200 MG CP24 Take 200 mg by mouth daily. 30 capsule 6  . tretinoin (RETIN-A) 0.025 % cream     . ZIOPTAN 0.0015 % SOLN     . naloxegol oxalate (MOVANTIK) 25 MG TABS tablet Take 25 mg by mouth daily.    Marland Kitchen SYNTHROID 50 MCG tablet 75 mcg daily.      No facility-administered medications prior to visit.    PAST MEDICAL HISTORY: Past Medical History  Diagnosis Date  . Headache(784.0)   . Breast cancer ([place])   . Migraine     PAST SURGICAL HISTORY: Past Surgical History  Procedure Laterality Date  . Breast lumpectomy    . Total abdominal hysterectomy N/A     FAMILY HISTORY: Family History  Problem Relation Age of Onset  . Heart disease Mother   . Headache Mother   . Heart attack Father     SOCIAL HISTORY: Social History   Social History  . Marital Status: Single     Spouse Name: Gwyndolyn Saxon   . Number of Children: 0  . Years of Education: Assoc   Occupational History  . Not on file.   Social History Main Topics  . Smoking status: Never Smoker   . Smokeless tobacco: Never Used  . Alcohol Use: Yes     Comment: drinks on the weekend  . Drug Use: No  . Sexual Activity: Not on file   Other Topics Concern  . Not on file   Social History Narrative   Patient lives at home with her husband. Gwyndolyn Saxon    Patient has an Geophysicist/field seismologist.    Patient has no children.    Patient is right handed.      PHYSICAL EXAM  Filed Vitals:   08/14/15 1553  BP: 128/77  Pulse: 72  Height: 5' (1.524 m)  Weight: 133 lb (60.328 kg)   Body mass index is 25.97 kg/(m^2).  Generalized: Well developed, obese female in no acute distress  Head: normocephalic and atraumatic,. Oropharynx benign  Neck: Supple, no carotid bruits  Cardiac: Regular rate rhythm, no murmur  Musculoskeletal: No deformity   Neurological examination   Mentation: Alert oriented to time, place, history taking. Attention span and concentration appropriate. Recent and remote memory intact. Follows all commands speech and language fluent.   Cranial nerve II-XII: Pupils were equal round reactive to light extraocular movements were full, visual field were full on confrontational test. Facial sensation and strength were normal. hearing was intact to finger rubbing bilaterally. Uvula tongue midline. head turning and shoulder shrug were normal and symmetric.Tongue protrusion into cheek strength was normal. Motor: normal bulk and tone, full strength in the BUE, BLE, fine finger movements normal, no pronator drift. No focal weakness Sensory: normal and symmetric to light touch, pinprick, and Vibration, Coordination: finger-nose-finger, heel-to-shin bilaterally, no dysmetria Reflexes: Brachioradialis 2/2, biceps 2/2, triceps 2/2, patellar 2/2, Achilles 2/2, plantar responses were flexor bilaterally. Gait  and Station: Rising up from seated position without assistance, normal stance, moderate stride, good arm swing, smooth turning, able to perform tiptoe, and heel walking without difficulty. Tandem gait is steady  DIAGNOSTIC DATA (LABS, IMAGING, TESTING) - ASSESSMENT AND PLAN  70 y.o. year old female  has a past medical history of Headache(784.0); Migraine and  long history of chronic pain seen at a pain clinic.   Will decrease trokendi XR 100 mg every day, new prescription was written  Continue Imitrex as needed  Continue follow-up with her primary care physician  Only return to clinic for new issues   Marcial Pacas, M.D. Ph.D.  [facility] [place],  [address] Phone: [phone] Fax:      [phone]

## 2015-08-25 ENCOUNTER — Telehealth: Payer: Self-pay | Admitting: Neurology

## 2015-08-25 MED ORDER — TOPIRAMATE ER 100 MG PO CAP24: 100.0000 mg | ORAL_CAPSULE | Freq: Every day | ORAL | Status: DC

## 2015-08-25 NOTE — Telephone Encounter (Signed)
Jill Salinas, please call patient, she has Topamax ER prescription on file, 100 mg every night, it was written July 2017 with 11 refills.

## 2015-08-25 NOTE — Telephone Encounter (Signed)
Pt called and states she had a horrible migraine yesterday. She wants to know about restarting Topamax. She is wondering about botox. Does she need to go back on Topamax even though she does not like it. Please call [phone]

## 2015-08-28 NOTE — Telephone Encounter (Signed)
Per Dr. Krista Blue, restart Topamax ER 100mg  qhs.  Spoke to patient - she is feeling much better today and is agreeable to restarting Topamax.

## 2015-11-21 ENCOUNTER — Emergency Department (HOSPITAL_COMMUNITY): Payer: Medicare Other

## 2015-11-21 ENCOUNTER — Emergency Department (HOSPITAL_COMMUNITY)
Admission: EM | Admit: 2015-11-21 | Discharge: 2015-11-21 | Disposition: A | Discharge status: Home / Self Care | Payer: Medicare Other | Attending: Emergency Medicine | Admitting: Emergency Medicine

## 2015-11-21 ENCOUNTER — Encounter (HOSPITAL_COMMUNITY): Payer: Self-pay | Admitting: Emergency Medicine

## 2015-11-21 DIAGNOSIS — K5903 Drug induced constipation: Secondary | ICD-10-CM | POA: Insufficient documentation

## 2015-11-21 DIAGNOSIS — R1010 Upper abdominal pain, unspecified: Secondary | ICD-10-CM | POA: Diagnosis present

## 2015-11-21 DIAGNOSIS — Z853 Personal history of malignant neoplasm of breast: Secondary | ICD-10-CM | POA: Insufficient documentation

## 2015-11-21 DIAGNOSIS — R109 Unspecified abdominal pain: Secondary | ICD-10-CM

## 2015-11-21 LAB — COMPREHENSIVE METABOLIC PANEL
ALBUMIN: 4 g/dL (ref 3.5–5.0)
ALK PHOS: 47 U/L (ref 38–126)
ALT: 14 U/L (ref 14–54)
ANION GAP: 7 (ref 5–15)
AST: 20 U/L (ref 15–41)
BUN: 7 mg/dL (ref 6–20)
CALCIUM: 9.4 mg/dL (ref 8.9–10.3)
CO2: 22 mmol/L (ref 22–32)
Chloride: 110 mmol/L (ref 101–111)
Creatinine, Ser: 0.7 mg/dL (ref 0.44–1.00)
GFR calc non Af Amer: >60 mL/min (ref >60)
GLUCOSE: 103 mg/dL — AB (ref 65–99)
POTASSIUM: 3.5 mmol/L (ref 3.5–5.1)
Sodium: 139 mmol/L (ref 135–145)
TOTAL PROTEIN: 6.7 g/dL (ref 6.5–8.1)
Total Bilirubin: 0.7 mg/dL (ref 0.3–1.2)

## 2015-11-21 LAB — URINALYSIS, ROUTINE W REFLEX MICROSCOPIC
Bilirubin Urine: NEGATIVE
Glucose, UA: NEGATIVE mg/dL
Hgb urine dipstick: NEGATIVE
Ketones, ur: NEGATIVE mg/dL
LEUKOCYTES UA: NEGATIVE
NITRITE: NEGATIVE
PH: 8 (ref 5.0–8.0)
Protein, ur: NEGATIVE mg/dL
SPECIFIC GRAVITY, URINE: 1.008 (ref 1.005–1.030)

## 2015-11-21 LAB — CBC
HEMATOCRIT: 37.8 % (ref 36.0–46.0)
HEMOGLOBIN: 12.5 g/dL (ref 12.0–15.0)
MCH: 32.9 pg (ref 26.0–34.0)
MCHC: 33.1 g/dL (ref 30.0–36.0)
MCV: 99.5 fL (ref 78.0–100.0)
Platelets: 239 10*3/uL (ref 150–400)
RBC: 3.8 MIL/uL — AB (ref 3.87–5.11)
RDW: 13.1 % (ref 11.5–15.5)
WBC: 7.2 10*3/uL (ref 4.0–10.5)

## 2015-11-21 LAB — LIPASE, BLOOD: Lipase: 26 U/L (ref 11–51)

## 2015-11-21 IMAGING — CT CT ABD-PELV W/O CM
2 of 4 series · 12 of 46 positions shown, 14 images · non-contrast
Comparison: None.

CLINICAL DATA: Left-sided abdominal pain for 1-1/2 years

EXAM:
CT ABDOMEN AND PELVIS WITHOUT CONTRAST
TECHNIQUE: Multidetector CT imaging of the abdomen and pelvis was performed
following the standard protocol without IV contrast.

[Series 201: routine, idose (2) · axial · 0.71mm/px · z∈[+130,+495]mm · 9 of 89 slices shown, 11 images]
[im 8/89  soft-tissue]
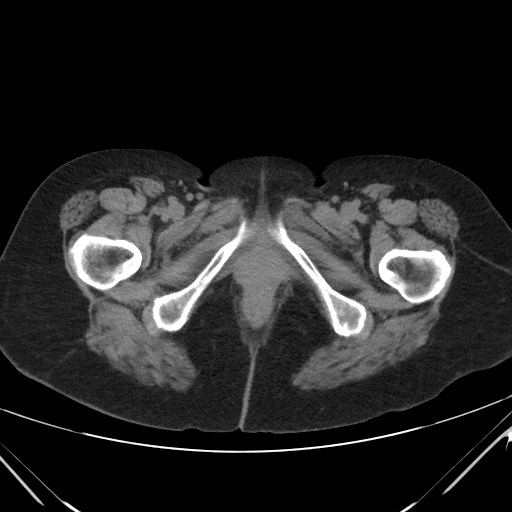
[im 8/89  bone]
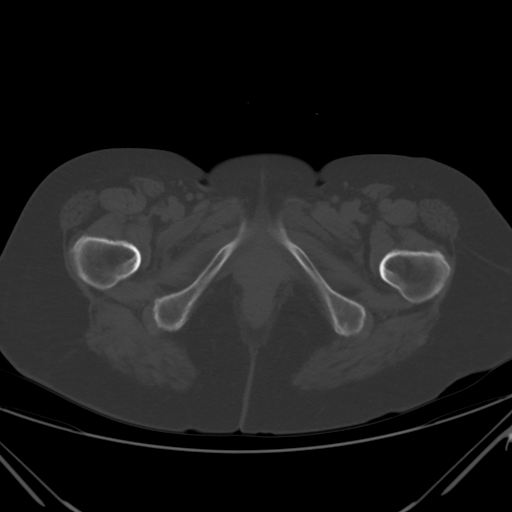
[im 16/89  soft-tissue]
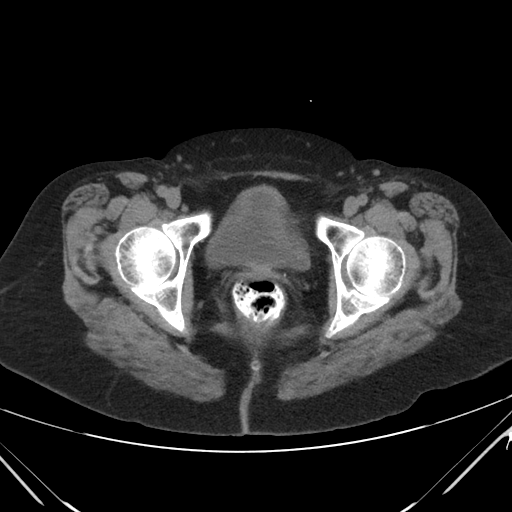
[im 27/89  soft-tissue]
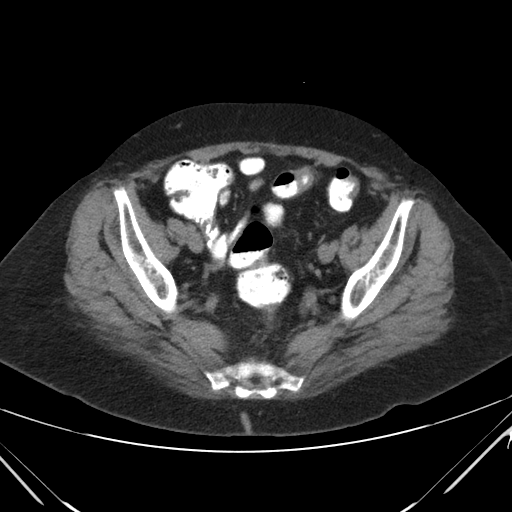
[im 35/89  soft-tissue]
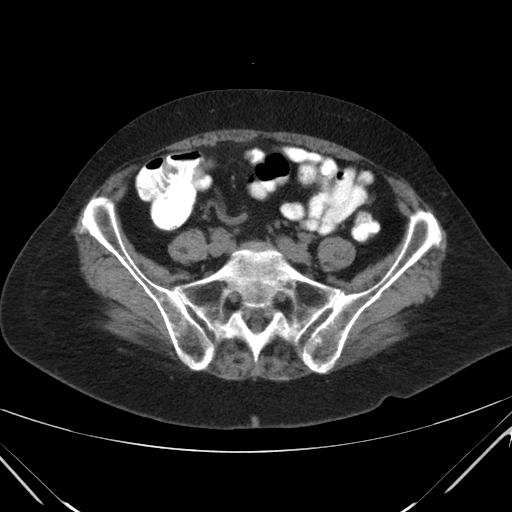
[im 46/89  soft-tissue]
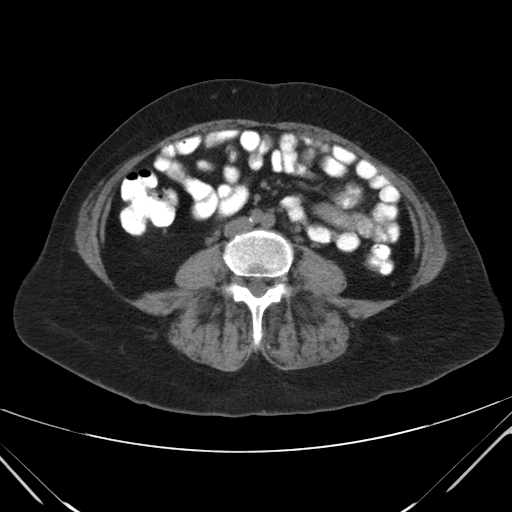
[im 54/89  soft-tissue]
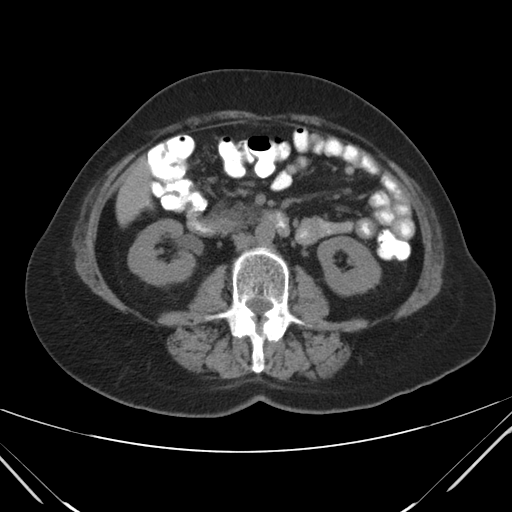
[im 62/89  soft-tissue]
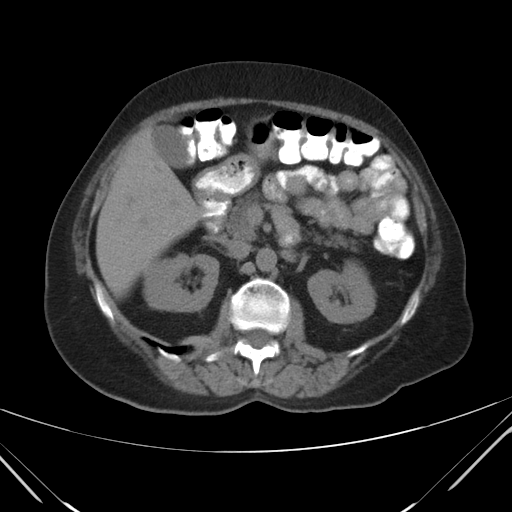
[im 73/89  soft-tissue]
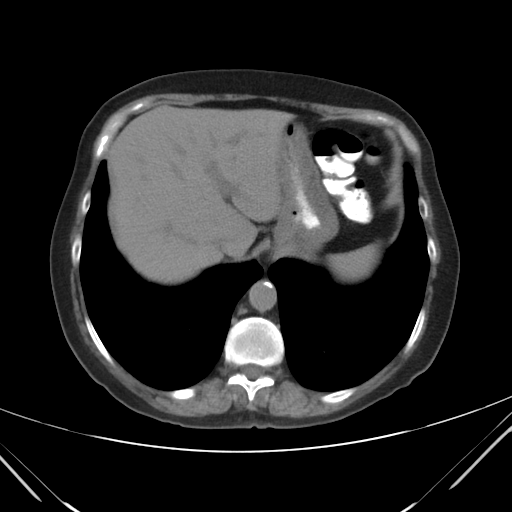
[im 81/89  soft-tissue]
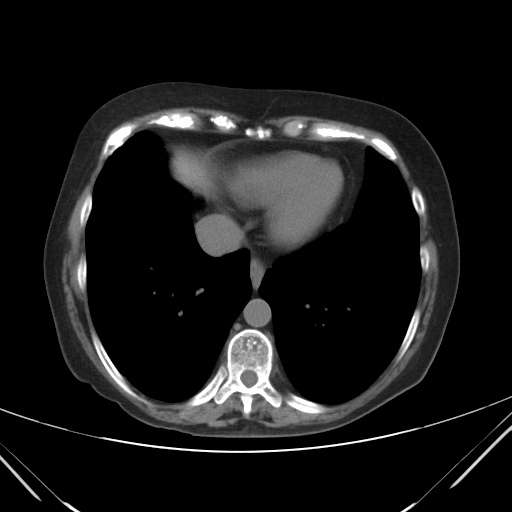
[im 81/89  bone]
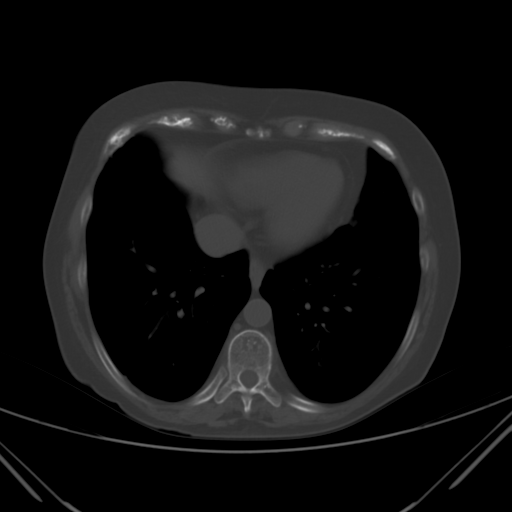

[Series 203: coronals, idose (2) · coronal · 0.45mm/px · 3 of 137 slices shown]
[im 46/137  soft-tissue]
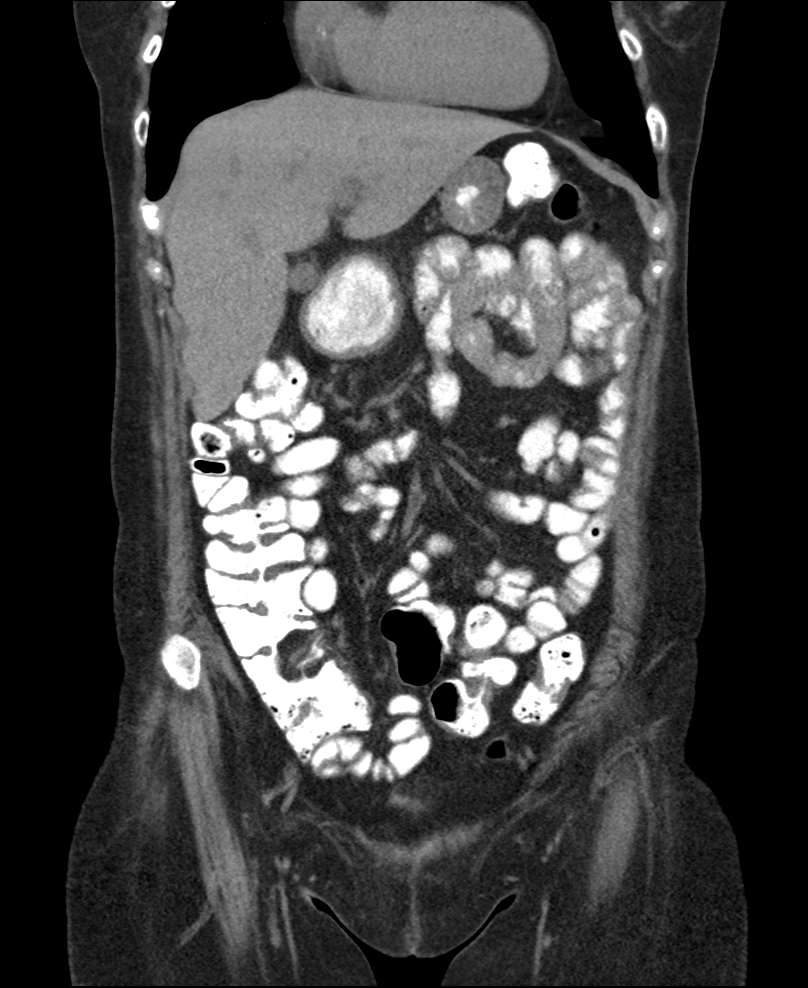
[im 61/137  soft-tissue]
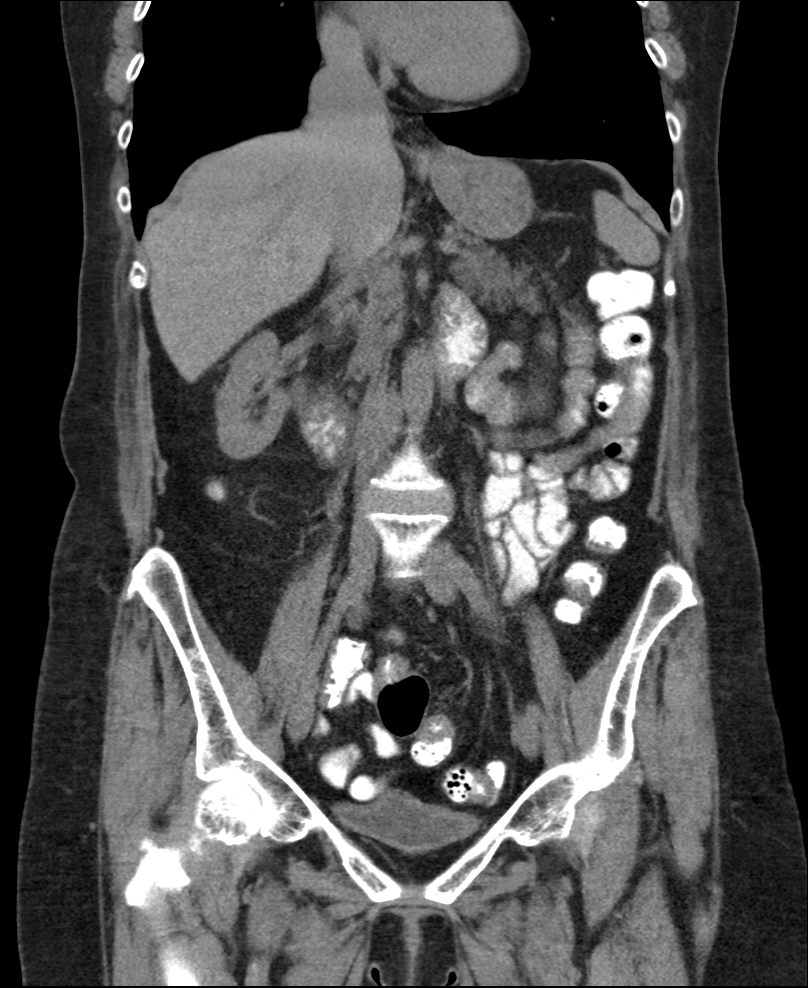
[im 76/137  soft-tissue]
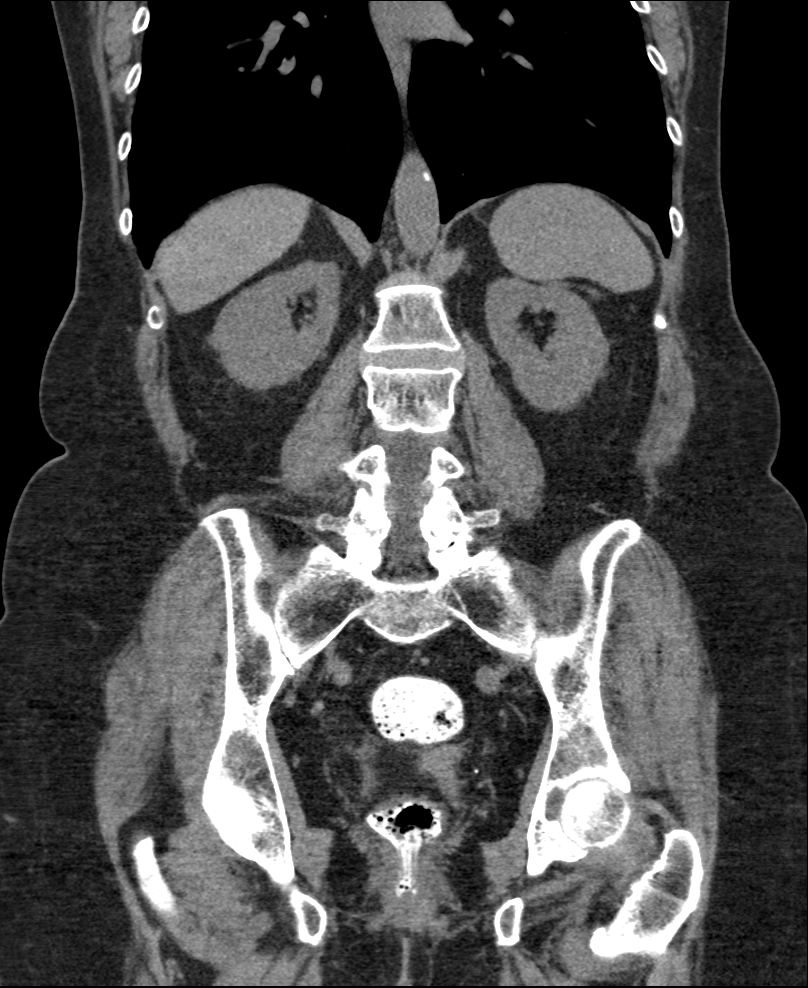

[12 of 46 positions shown; findings below may reference images not displayed]

FINDINGS: Lower chest: No acute abnormality.

Hepatobiliary: No focal liver abnormality is seen. No gallstones,
gallbladder wall thickening, or biliary dilatation.

Pancreas: Unremarkable. No pancreatic ductal dilatation or
surrounding inflammatory changes.

Spleen: Normal in size without focal abnormality.

Adrenals/Urinary Tract: Adrenal glands are unremarkable. Kidneys are
normal, without renal calculi, focal lesion, or hydronephrosis.
Bladder is unremarkable.

Stomach/Bowel: Stomach is within normal limits. Appendix is not well
seen although no inflammatory changes are noted. No evidence of
bowel wall thickening, distention, or inflammatory changes.

Vascular/Lymphatic: Aortic atherosclerosis. No enlarged abdominal or
pelvic lymph nodes.

Reproductive: Status post hysterectomy. No adnexal masses.

Other: No abdominal wall hernia or abnormality. No abdominopelvic
ascites.

Musculoskeletal: No acute or significant osseous findings.
IMPRESSION: No acute abnormality noted.

## 2015-11-21 MED ORDER — MILK AND MOLASSES ENEMA
1.0000 | Freq: Once | RECTAL | Status: AC
  Administered 2015-11-21: 250 mL via RECTAL
  Filled 2015-11-21: qty 250

## 2015-11-21 MED ORDER — GLYCERIN (LAXATIVE) 2.1 G RE SUPP: 1.0000 | Freq: Once | RECTAL | Status: DC

## 2015-11-21 MED ORDER — SODIUM CHLORIDE 0.9 % IV BOLUS (SEPSIS)
1000.0000 mL | Freq: Once | INTRAVENOUS | Status: AC
  Administered 2015-11-21: 1000 mL via INTRAVENOUS

## 2015-11-21 MED ORDER — BISACODYL 10 MG RE SUPP: 10.0000 mg | RECTAL | 0 refills | Status: DC | PRN

## 2015-11-21 MED ORDER — LACTULOSE 10 GM/15ML PO SOLN: 20.0000 g | Freq: Two times a day (BID) | ORAL | 0 refills | Status: DC

## 2015-11-21 MED ORDER — BARIUM SULFATE 2.1 % PO SUSP
ORAL | Status: AC
  Filled 2015-11-21: qty 2

## 2015-11-21 MED ORDER — DOCUSATE SODIUM 100 MG PO CAPS: 100.0000 mg | ORAL_CAPSULE | Freq: Two times a day (BID) | ORAL | 0 refills | Status: DC

## 2015-11-21 NOTE — ED Triage Notes (Signed)
Pt states "ive got really bad pain in my left lower stomach." Pt takes chronic pain medicine for pain in her left leg. Pt states shes had to disimpact herself before due to constipation. Pt states the pain is similar to that. Takes oxycodone for her chronic pain. LBM last night, and it was painful.

## 2015-11-21 NOTE — ED Notes (Signed)
Patient transported to CT 

## 2015-11-21 NOTE — ED Notes (Signed)
Pt finished with contrast for CT.

## 2015-11-21 NOTE — ED Notes (Signed)
Pt ambulated to and from restroom; tolerated well 

## 2015-11-21 NOTE — ED Provider Notes (Signed)
[facility] DEPT Provider Note   CSN: [id number] Arrival date & time: 11/21/15  1013     History   Chief Complaint Chief Complaint  Patient presents with  . Abdominal Pain  . Constipation    HPI Jill Salinas is a 70 y.o. female.  HPI Patient presents to the emergency department with abdominal discomfort.  The patient states that she has had this for about 2 weeks and states that she has been having constipation as well.  She states that she attempted to disimpact herself last week and got some relief.  She states that she feels like the pain is more in the upper part of her abdomen rounded than her rectal area. The patient denies chest pain, shortness of breath, headache,blurred vision, neck pain, fever, cough, weakness, numbness, dizziness, anorexia, edema,  nausea, vomiting, diarrhea, rash, back pain, dysuria, hematemesis, bloody stool, near syncope, or syncope. Past Medical History:  Diagnosis Date  . Breast cancer ([place])   . Headache(784.0)   . Migraine     Patient Active Problem List   Diagnosis Date Noted  . Migraine   . Neck pain 08/04/2013  . Leg pain 08/04/2013  . Low back pain 06/25/2013  . Migraine without aura 06/15/2012    Past Surgical History:  Procedure Laterality Date  . BREAST LUMPECTOMY    . TOTAL ABDOMINAL HYSTERECTOMY N/A     OB History    No data available       Home Medications    Prior to Admission medications   Medication Sig Start Date End Date Taking? Authorizing Provider  denosumab (PROLIA) 60 MG/ML SOLN injection Inject 60 mg into the skin every 6 (six) months. Administer in upper arm, thigh, or abdomen    Historical Provider, MD  DULoxetine (CYMBALTA) 30 MG capsule Take 30 mg by mouth daily.  09/27/14   Historical Provider, MD  DULoxetine (CYMBALTA) 60 MG capsule 60 mg daily.  05/26/12   Historical Provider, MD  omeprazole (PRILOSEC) 20 MG capsule  11/02/14   Historical Provider, MD  oxyCODONE-acetaminophen (PERCOCET) 7.5-325 MG  tablet  11/13/14   Historical Provider, MD  SUMAtriptan (IMITREX) 100 MG tablet TAKE ONE TABLET BY MOUTH AS NEEDED FOR MIGRAINE  OR  HEADACHE  MAY  REPEAT  IN  2  HOURS  IF  HEADACHE  PERSISTS  OR  RECURS 02/06/15   Marcial Pacas, MD  SUMAtriptan 6 MG/0.5ML SOAJ USE AS DIRECTED FOR MIGRAINE 05/31/14   Marcial Pacas, MD  temazepam (RESTORIL) 30 MG capsule Take 30 mg by mouth at bedtime as needed for sleep.    Historical Provider, MD  Topiramate ER (TROKENDI XR) 100 MG CP24 Take 100 mg by mouth at bedtime. 08/25/15   Marcial Pacas, MD  tretinoin (RETIN-A) 0.025 % cream  10/27/14   Historical Provider, MD  ZIOPTAN 0.0015 % SOLN  05/20/13   Historical Provider, MD    Family History Family History  Problem Relation Age of Onset  . Heart disease Mother   . Headache Mother   . Heart attack Father     Social History Social History  Substance Use Topics  . Smoking status: Never Smoker  . Smokeless tobacco: Never Used  . Alcohol use Yes     Comment: drinks on the weekend     Allergies   Codeine; Depakote [divalproex sodium]; and Iodinated diagnostic agents   Review of Systems Review of Systems  All other systems negative except as documented in the HPI. All pertinent positives and  negatives as reviewed in the HPI. Physical Exam Updated Vital Signs BP 120/78 (BP Location: Left Arm)   Pulse 91   Temp 98.3 F (36.8 C) (Oral)   Resp 16   Ht 5' (1.524 m)   Wt 58.1 kg   SpO2 100%   BMI 25.00 kg/m   Physical Exam  Constitutional: She is oriented to person, place, and time. She appears well-developed and well-nourished. No distress.  HENT:  Head: Normocephalic and atraumatic.  Mouth/Throat: Oropharynx is clear and moist.  Eyes: Pupils are equal, round, and reactive to light.  Neck: Normal range of motion. Neck supple.  Cardiovascular: Normal rate, regular rhythm and normal heart sounds.  Exam reveals no gallop and no friction rub.   No murmur heard. Pulmonary/Chest: Effort normal and breath  sounds normal. No respiratory distress. She has no wheezes.  Abdominal: Soft. Bowel sounds are normal. She exhibits no distension and no mass. There is tenderness. There is no rebound and no guarding.  Neurological: She is alert and oriented to person, place, and time. She exhibits normal muscle tone. Coordination normal.  Skin: Skin is warm and dry. No rash noted. No erythema.  Psychiatric: She has a normal mood and affect. Her behavior is normal.  Nursing note and vitals reviewed.    ED Treatments / Results  Labs (all labs ordered are listed, but only abnormal results are displayed) Labs Reviewed  COMPREHENSIVE METABOLIC PANEL - Abnormal; Notable for the following:       Result Value   Glucose, Bld 103 (*)    All other components within normal limits  CBC - Abnormal; Notable for the following:    RBC 3.80 (*)    All other components within normal limits  LIPASE, BLOOD  URINALYSIS, ROUTINE W REFLEX MICROSCOPIC (NOT AT [facility])    EKG  EKG Interpretation None       Radiology No results found.  Procedures Procedures (including critical care time)  Medications Ordered in ED Medications  Barium Sulfate 2.1 % SUSP (not administered)  sodium chloride 0.9 % bolus 1,000 mL (1,000 mLs Intravenous New Bag/Given 11/21/15 1427)     Initial Impression / Assessment and Plan / ED Course  I have reviewed the triage vital signs and the nursing notes.  Pertinent labs & imaging results that were available during my care of the patient were reviewed by me and considered in my medical decision making (see chart for details).  Clinical Course    Patient will have imaging of her abdomen.  She was given IV fluids.  Patient had to drink barium and rather than IV contrast due to her allergies  Final Clinical Impressions(s) / ED Diagnoses   Final diagnoses:  Abdominal pain    New Prescriptions New Prescriptions   No medications on file     Dalia Heading, PA-C 11/21/15  1604    Julianne Rice, MD 11/26/15 [phone]

## 2015-12-28 ENCOUNTER — Telehealth: Payer: Self-pay | Admitting: Neurology

## 2015-12-28 ENCOUNTER — Other Ambulatory Visit: Payer: Self-pay | Admitting: Neurology

## 2015-12-28 NOTE — Telephone Encounter (Addendum)
Patient called, states she can barely walk some days, has tremendous burning in left foot, left buttock and all the way down leg, continuous day and night, "can feel the heat coming off my leg, bottom of my foot will actually sweat", read this is a side effect of medication she is taking SUMAtriptan (IMITREX) 100 MG tablet, SUMAtriptan 6 MG/0.5ML SOAJ. Please call [phone].

## 2015-12-28 NOTE — Telephone Encounter (Signed)
noted 

## 2015-12-28 NOTE — Telephone Encounter (Signed)
Spoke to patient - she is concerned about the increase in migraines and the limited relief she is getting with her home medications.  She is also concerned that sumatriptan may be causing some adverse side effects.  Per Dr. Krista Blue, she would like patient to see NP.  Offered her an appt on 12/29/15 but she declined.  She has been scheduled w/ [name] on 01/01/16.

## 2016-01-01 ENCOUNTER — Encounter: Payer: Self-pay | Admitting: Nurse Practitioner

## 2016-01-01 ENCOUNTER — Ambulatory Visit (INDEPENDENT_AMBULATORY_CARE_PROVIDER_SITE_OTHER): Payer: Medicare Other | Admitting: Nurse Practitioner

## 2016-01-01 VITALS — BP 125/78 | HR 104 | Ht 60.0 in | Wt 131.2 lb

## 2016-01-01 DIAGNOSIS — G8929 Other chronic pain: Secondary | ICD-10-CM

## 2016-01-01 DIAGNOSIS — M545 Low back pain: Secondary | ICD-10-CM | POA: Diagnosis not present

## 2016-01-01 DIAGNOSIS — G43009 Migraine without aura, not intractable, without status migrainosus: Secondary | ICD-10-CM | POA: Diagnosis not present

## 2016-01-01 MED ORDER — SUMATRIPTAN SUCCINATE 100 MG PO TABS: ORAL_TABLET | ORAL | 6 refills | Status: DC

## 2016-01-01 NOTE — Progress Notes (Signed)
I agree with the assessment and plan as directed by NP .WID   Zaniel Marineau, MD  

## 2016-01-01 NOTE — Patient Instructions (Signed)
Continue trokendi XR 100 mg every day, please take medication at the same time, does not need refills  Continue Imitrex as needed  Will refill Continue follow-up with pain management Follow-up here in 6 months

## 2016-01-01 NOTE — Progress Notes (Signed)
[facility]  PATIENT: Jill Salinas DOB: 04/06/1945   REASON FOR VISIT: Follow-up for migraine HISTORY FROM: Patient    HISTORY OF PRESENT ILLNESS:HISTORY YYMs. Salinas, 70 year old female, last visit was with [name] in March 2015 for migraine. She has migraines for many many years currently well controlled on 200 mg of Topamax at night. She also takes gabapentin 900mg  daily, which has helped her migraines as well. She uses Imitrex acutely both the injectable and the oral preparation. She thinks her headaches are in good control. She used imitrex po first, if that does not take care of her headaches, she would use injections.  This an early visit for her complains of 2 months history of right side neck pain, right occipital area pain, it bothers her 2/week, lasting all day, she felt spacy dealing with the pain, she does not like to drive,  She also had trouble walking, do not have the strength to get up an even steps, xone year, she has no incontinence, more so on the left leg, no involvement of left arm, she also has left knee swelling, felt so bad. She had 30 Lb weight gain over one year "can not get into any of my cloth" She also complains of low back pain, radiating pain to her left leg, has tried physical therapy recently, which has been helpful, no incontinence.   UPDATE June 24th 2015: EMG/NCS in June 24th 2015 showed no evidence of left lumbar radiculopathy, there was evidence of right triceps irritation, but no neuropathic changes at other selected right upper extremity needle examination.  We have reviewed MRI lumbar film together, only mild degenerative disc disease, there was no significant foraminal, or canal stenosis   She is taking Cymbalta now, which has helped her some, but continued complaint bilateral lower extremity deep achy pain, especially at nighttime, she no longer has shooting pain from her neck to her shoulders, or arms, but she complains of  deep achy pain in her midline upper cervical region,  She also complains fatigued easily, lack of stamina She had left lumbar epidural injection, by interventional radiologist Dr. Lawrence Santiago at left L4-5 space, in August 24th 2015, she only had transient improvement,  Now she complains of left lower extremity pain, bilateral lower extremity deep achy pain, feet paresthesia, gait difficulty,  UPDATE April 25th 2016:YY She complains of two months history of neck pain, spreading forward to bilateral retrorbital area, daily, can go up to 10/10, radiating to her right shoulder, woke up at night with headaches,has been using frequent Imitrex tablet, sometimes injection  She has history of migraine all her life, previous headaches is lateralized severe pounding headache, now it is at her neck, She tries to sleep different ways,Change pillows without helping  She is under pain management for her low back pain,is taking daily hydrocodone, tried Elavil before, complains of dizziness, weight gain.  Update July third 2017:YY She has severe constipation, has not used bathroom for 3 months,  She complains of bloating, tried different methods, medications, abdomen massage,  Her migraine has much improved, she has migraines headaches every few times, she is taking imitrex as needed, which has been helpful she is worried about side effect of Trokendi xr, I will decrease the dosage from 200mg  to 100mg  qhs. UPDATE 11/20/2017CM Jill Salinas, 70 year old female returns for follow-up. She was seen in the ER back in October for chronic constipation due to pain medication use. She goes to a pain pain clinic in [place]. She  is seen here for migraines which have been a little worse since last seen.Trokendi was decreased from 200 mg daily to 100 mg daily. Unfortunately she does not take the medication same time every day. Imitrex continues to work for her acutely. She is concerned about kidney problems with the trokendi  however her recent creatinine when seen in the hospital 2 months ago was 0.70. Her CBC and CMP were within normal limits. She takes Imitrex injections if the Imitrex oral does not work. She returns for reevaluation  REVIEW OF SYSTEMS: Full 14 system review of systems performed and notable only for those listed, all others are neg:  Constitutional: Fatigue Cardiovascular: neg Ear/Nose/Throat: neg  Skin: neg Eyes: Blurred vision light sensitivity Respiratory: neg Gastroitestinal: Chronic constipation  Hematology/Lymphatic: neg  Endocrine: neg Musculoskeletal: Joint pain aching muscles, seen in the pain clinic Allergy/Immunology: neg Neurological: Headache Psychiatric: neg Sleep : neg   ALLERGIES: Allergies  Allergen Reactions  . Codeine   . Depakote [Divalproex Sodium] Nausea And Vomiting  . Iodinated Diagnostic Agents Other (See Comments)    Tthroat itching, pt received a 13 hour prep for injection    HOME MEDICATIONS: Outpatient Medications Prior to Visit  Medication Sig Dispense Refill  . bisacodyl ([facility] BISACODYL) 10 MG suppository Place 1 suppository (10 mg total) rectally as needed for moderate constipation. 12 suppository 0  . denosumab (PROLIA) 60 MG/ML SOLN injection Inject 60 mg into the skin every 6 (six) months. Administer in upper arm, thigh, or abdomen    . docusate sodium (COLACE) 100 MG capsule Take 1 capsule (100 mg total) by mouth every 12 (twelve) hours. 60 capsule 0  . DULoxetine (CYMBALTA) 30 MG capsule Take 30 mg by mouth daily.     . DULoxetine (CYMBALTA) 60 MG capsule 60 mg daily.     Marland Kitchen lactulose (CHRONULAC) 10 GM/15ML solution Take 30 mLs (20 g total) by mouth 2 (two) times daily. 240 mL 0  . LUMIGAN 0.01 % SOLN Place 1 drop into both eyes at bedtime.    Marland Kitchen omeprazole (PRILOSEC) 20 MG capsule daily as needed.     Marland Kitchen oxyCODONE-acetaminophen (PERCOCET) 7.5-325 MG tablet 1 tablet every 4 (four) hours as needed for severe pain.     . SUMAtriptan (IMITREX) 100  MG tablet TAKE ONE TABLET BY MOUTH AS NEEDED FOR MIGRAINE  OR  HEADACHE  MAY  REPEAT  IN  2  HOURS  IF  HEADACHE  PERSISTS  OR  RECURS 9 tablet 6  . SUMAtriptan 6 MG/0.5ML SOAJ AS DIRECTED FOR  MIGRAINE 9 Syringe 3  . temazepam (RESTORIL) 30 MG capsule Take 30 mg by mouth at bedtime as needed for sleep.    . Topiramate ER (TROKENDI XR) 100 MG CP24 Take 100 mg by mouth at bedtime. 30 capsule 11  . tretinoin (RETIN-A) 0.025 % cream Apply 1 application topically at bedtime.     Marland Kitchen SYNTHROID 50 MCG tablet Take 50 mcg by mouth every morning.     No facility-administered medications prior to visit.     PAST MEDICAL HISTORY: Past Medical History:  Diagnosis Date  . Breast cancer ([place])   . Headache(784.0)   . Migraine     PAST SURGICAL HISTORY: Past Surgical History:  Procedure Laterality Date  . BREAST LUMPECTOMY    . TOTAL ABDOMINAL HYSTERECTOMY N/A     FAMILY HISTORY: Family History  Problem Relation Age of Onset  . Heart disease Mother   . Headache Mother   . Heart  attack Father     SOCIAL HISTORY: Social History   Social History  . Marital status: Single    Spouse name: Gwyndolyn Saxon   . Number of children: 0  . Years of education: Assoc   Occupational History  . Not on file.   Social History Main Topics  . Smoking status: Never Smoker  . Smokeless tobacco: Never Used  . Alcohol use Yes     Comment: drinks on the weekend  . Drug use: No  . Sexual activity: Not on file   Other Topics Concern  . Not on file   Social History Narrative   Patient lives at home with her husband. Gwyndolyn Saxon    Patient has an Geophysicist/field seismologist.    Patient has no children.    Patient is right handed.      PHYSICAL EXAM  Vitals:   01/01/16 1533  BP: 125/78  Pulse: (!) 104  Weight: 131 lb 3.2 oz (59.5 kg)  Height: 5' (1.524 m)   Body mass index is 25.62 kg/m. Generalized: Well developed,  female in no acute distress  Head: normocephalic and atraumatic,. Oropharynx benign  Neck:  Supple, no carotid bruits  Cardiac: Regular rate rhythm, no murmur  Musculoskeletal: No deformity   Neurological examination   Mentation: Alert oriented to time, place, history taking. Attention span and concentration appropriate. Recent and remote memory intact. Follows all commands speech and language fluent.   Cranial nerve II-XII: Pupils were equal round reactive to light extraocular movements were full, visual field were full on confrontational test. Facial sensation and strength were normal. hearing was intact to finger rubbing bilaterally. Uvula tongue midline. head turning and shoulder shrug were normal and symmetric.Tongue protrusion into cheek strength was normal. Motor: normal bulk and tone, full strength in the BUE, BLE, fine finger movements normal, no pronator drift. No focal weakness Sensory: normal and symmetric to light touch, pinprick, and Vibration In the upper and lower extremities, Coordination: finger-nose-finger, heel-to-shin bilaterally, no dysmetria Reflexes: 1+ upper lower and symmetric plantar responses were flexor bilaterally. Gait and Station: Rising up from seated position without assistance, normal stance, moderate stride, good arm swing, smooth turning, able to perform tiptoe, and heel walking without difficulty. Tandem gait is steady DIAGNOSTIC DATA (LABS, IMAGING, TESTING) - I reviewed patient records, labs, notes, testing and imaging myself where available.  Lab Results  Component Value Date   WBC 7.2 11/21/2015   HGB 12.5 11/21/2015   HCT 37.8 11/21/2015   MCV 99.5 11/21/2015   PLT 239 11/21/2015      Component Value Date/Time   NA 139 11/21/2015 1104   K 3.5 11/21/2015 1104   CL 110 11/21/2015 1104   CO2 22 11/21/2015 1104   GLUCOSE 103 (H) 11/21/2015 1104   BUN 7 11/21/2015 1104   CREATININE 0.70 11/21/2015 1104   CALCIUM 9.4 11/21/2015 1104   PROT 6.7 11/21/2015 1104   ALBUMIN 4.0 11/21/2015 1104   AST 20 11/21/2015 1104   ALT 14  11/21/2015 1104   ALKPHOS 47 11/21/2015 1104   BILITOT 0.7 11/21/2015 1104   GFRNONAA >60 11/21/2015 1104   GFRAA >60 11/21/2015 1104    ASSESSMENT AND PLAN  70 y.o. year old female  has a past medical history of Breast cancer ([place]); Headache(784.0); and Migraine. here To follow-up for her migraines. She has a long history of chronic pain and is seen at a  pain clinic. The patient is a current patient of Dr. Krista Blue  who is out  of the office today . This note is sent to the work in doctor.     PLAN: Continue trokendi XR 100 mg every day, please take medication at the same time, does not need refills  Continue Imitrex as needed  Will refill Continue follow-up with pain management Try to exercise water aerobics would be great Follow-up here in 6 months Jill Salinas, [facility], [facility], [facility] [address] [place], [place] [address] [phone]

## 2016-02-22 ENCOUNTER — Telehealth: Payer: Self-pay | Admitting: Nurse Practitioner

## 2016-02-22 NOTE — Telephone Encounter (Signed)
Pt called to advise HA's are under control although her head feels heavy when getting up in the morning. For the past 6-8 wks she has been having extreme pain, weakness, fatigue in the legs. It is extreme burning pain. She said the pain meds do not help so she has cut back on those. Says she is so tired, fatigued all the time.  An appt was made 2 5/18. Rn please call-

## 2016-02-22 NOTE — Telephone Encounter (Signed)
I called pt and she is having worsening (now all the time, burning  In her legs, weakness, cannot sleep at night, fatigue. She has had this intermittently but now constant.    We see her for migraines predominantly, but also back , leg pain.  She see's pain management as well.  She has mentioned this to both her PM and endocrinologist.    Her home and husbands cell # best to call.

## 2016-02-23 NOTE — Telephone Encounter (Signed)
Patient had constellation of complaints over the years, including above-mentioned symptoms, it is okay to keep March 18 2016 appointment

## 2016-02-23 NOTE — Telephone Encounter (Signed)
Spoke to pt and relayed that to keep appt on 03-18-16 with CM/NP .  Pt ok with this.  Will see pcp, Dr. Alroy Dust before this if needed.

## 2016-03-14 ENCOUNTER — Telehealth: Payer: Self-pay | Admitting: Neurology

## 2016-03-14 NOTE — Telephone Encounter (Signed)
Patient called stating she has have been waking up with her legs jerking at night and teeth chattering.  States she has tried gabapentin before with no relief.  Please call

## 2016-03-14 NOTE — Telephone Encounter (Signed)
I spoke to pt.  I told her that would need evalu prior to change of medications.  She is taking care of her mother at this time (or for the last 3 wks) an is exhausted.  She could not come.  She felt her neuropathy is not going to change or go away and will live with it.  I told her that multiple meds are used for neuropathy and its finding the ones that fit each pt to manage pain.  She is aware.  She wanted to mention that she is not sleeping well, jerking of legs at night awaken her, and teeth chattering.  Has appt in 06-2016 and will keep that and call if things worsen.  She appreciates Dr Krista Blue and CM/NP for all there help.

## 2016-03-18 ENCOUNTER — Ambulatory Visit: Payer: Medicare Other | Admitting: Nurse Practitioner

## 2016-04-29 ENCOUNTER — Telehealth: Payer: Self-pay | Admitting: Nurse Practitioner

## 2016-04-29 NOTE — Telephone Encounter (Signed)
I relayed that she needs to ask her pcp for this, as we see her for migraine headaches.  She said she would.  She needs to continue to use the mouth guards for her the clenching.  She said she would.  Has appt in 07-02-16 for her eval with [place].

## 2016-04-29 NOTE — Telephone Encounter (Signed)
Pt called said fill in dentist for Dr Nori Riis (she is out on maternity leave) said the pt is grinding her teeth at night and there is wear and tear on the jaw. Said she has a mouth guard but she takes it out in her sleep. The dentist is suggesting a muscle relaxer, she is wanting to know if [name] would send something to [facility]. She said a muscle relaxer has been suggested in the past for HA's

## 2016-07-02 ENCOUNTER — Encounter: Payer: Self-pay | Admitting: Nurse Practitioner

## 2016-07-02 ENCOUNTER — Ambulatory Visit (INDEPENDENT_AMBULATORY_CARE_PROVIDER_SITE_OTHER): Payer: Medicare Other | Admitting: Nurse Practitioner

## 2016-07-02 VITALS — BP 108/69 | HR 74 | Wt 128.6 lb

## 2016-07-02 DIAGNOSIS — G43009 Migraine without aura, not intractable, without status migrainosus: Secondary | ICD-10-CM | POA: Diagnosis not present

## 2016-07-02 MED ORDER — SUMATRIPTAN SUCCINATE 100 MG PO TABS: ORAL_TABLET | ORAL | 6 refills | Status: DC

## 2016-07-02 MED ORDER — SUMATRIPTAN SUCCINATE 6 MG/0.5ML ~~LOC~~ SOAJ: SUBCUTANEOUS | 3 refills | Status: DC

## 2016-07-02 NOTE — Progress Notes (Signed)
[facility]  PATIENT: BARBRA MINER DOB: Oct 28, 1945   REASON FOR VISIT: Follow-up for migraine HISTORY FROM: Patient    HISTORY OF PRESENT ILLNESS:HISTORY YYMs. Cavness, 71 year old female, last visit was with [name] in March 2015 for migraine. She has migraines for many many years currently well controlled on 200 mg of Topamax at night. She also takes gabapentin 900mg  daily, which has helped her migraines as well. She uses Imitrex acutely both the injectable and the oral preparation. She thinks her headaches are in good control. She used imitrex po first, if that does not take care of her headaches, she would use injections.  This an early visit for her complains of 2 months history of right side neck pain, right occipital area pain, it bothers her 2/week, lasting all day, she felt spacy dealing with the pain, she does not like to drive,  She also had trouble walking, do not have the strength to get up an even steps, xone year, she has no incontinence, more so on the left leg, no involvement of left arm, she also has left knee swelling, felt so bad. She had 30 Lb weight gain over one year "can not get into any of my cloth" She also complains of low back pain, radiating pain to her left leg, has tried physical therapy recently, which has been helpful, no incontinence.   UPDATE June 24th 2015:YY EMG/NCS in June 24th 2015 showed no evidence of left lumbar radiculopathy, there was evidence of right triceps irritation, but no neuropathic changes at other selected right upper extremity needle examination.  We have reviewed MRI lumbar film together, only mild degenerative disc disease, there was no significant foraminal, or canal stenosis   She is taking Cymbalta now, which has helped her some, but continued complaint bilateral lower extremity deep achy pain, especially at nighttime, she no longer has shooting pain from her neck to her shoulders, or arms, but she complains  of deep achy pain in her midline upper cervical region,  She also complains fatigued easily, lack of stamina She had left lumbar epidural injection, by interventional radiologist Dr. Lawrence Santiago at left L4-5 space, in August 24th 2015, she only had transient improvement,  Now she complains of left lower extremity pain, bilateral lower extremity deep achy pain, feet paresthesia, gait difficulty,  UPDATE April 25th 2016:YY She complains of two months history of neck pain, spreading forward to bilateral retrorbital area, daily, can go up to 10/10, radiating to her right shoulder, woke up at night with headaches,has been using frequent Imitrex tablet, sometimes injection  She has history of migraine all her life, previous headaches is lateralized severe pounding headache, now it is at her neck, She tries to sleep different ways,Change pillows without helping  She is under pain management for her low back pain,is taking daily hydrocodone, tried Elavil before, complains of dizziness, weight gain.  Update July third 2017:YY She has severe constipation, has not used bathroom for 3 months,  She complains of bloating, tried different methods, medications, abdomen massage,  Her migraine has much improved, she has migraines headaches every few times, she is taking imitrex as needed, which has been helpful she is worried about side effect of Trokendi xr, I will decrease the dosage from 200mg  to 100mg  qhs. UPDATE 11/20/2017CM Ms. Needles, 71 year old female returns for follow-up. She was seen in the ER back in October for chronic constipation due to pain medication use. She goes to a pain pain clinic in [place]. She  is seen here for migraines which have been a little worse since last seen.Trokendi was decreased from 200 mg daily to 100 mg daily. Unfortunately she does not take the medication same time every day. Imitrex continues to work for her acutely. She is concerned about kidney problems with the  trokendi however her recent creatinine when seen in the hospital 2 months ago was 0.70. Her CBC and CMP were within normal limits. She takes Imitrex injections if the Imitrex oral does not work. She returns for reevaluation UPDATE 05/22/2018CM Ms. Pieczynski, 71 year old female returns for follow-up for history of migraines which are in good control at present. She is currently on trokendi 100 mg at bedtime. In addition she has Imitrex to take both orally  and injection as needed. Imitrex works acutely her headaches are in good control at present. She returns for reevaluation REVIEW OF SYSTEMS: Full 14 system review of systems performed and notable only for those listed, all others are neg:  Constitutional: neg Cardiovascular: neg Ear/Nose/Throat: neg  Skin: neg Eyes: Blurred vision light sensitivity Respiratory: neg Gastroitestinal:neg  Hematology/Lymphatic: neg  Endocrine: neg Musculoskeletal: Joint pain aching muscles, seen in the pain clinic Allergy/Immunology: neg Neurological: Headache Psychiatric: Anxiety Sleep : neg   ALLERGIES: Allergies  Allergen Reactions  . Iodinated Diagnostic Agents Other (See Comments)    Tthroat itching Tthroat itching, pt received a 13 hour prep for injection  . Codeine   . Depakote [Divalproex Sodium] Nausea And Vomiting    HOME MEDICATIONS: Outpatient Medications Prior to Visit  Medication Sig Dispense Refill  . bisacodyl ([facility] BISACODYL) 10 MG suppository Place 1 suppository (10 mg total) rectally as needed for moderate constipation. 12 suppository 0  . denosumab (PROLIA) 60 MG/ML SOLN injection Inject 60 mg into the skin every 6 (six) months. Administer in upper arm, thigh, or abdomen    . docusate sodium (COLACE) 100 MG capsule Take 1 capsule (100 mg total) by mouth every 12 (twelve) hours. 60 capsule 0  . DULoxetine (CYMBALTA) 30 MG capsule Take 30 mg by mouth daily.     . DULoxetine (CYMBALTA) 60 MG capsule 60 mg daily.     Marland Kitchen lactulose  (CHRONULAC) 10 GM/15ML solution Take 30 mLs (20 g total) by mouth 2 (two) times daily. 240 mL 0  . LUMIGAN 0.01 % SOLN Place 1 drop into both eyes at bedtime.    Marland Kitchen omeprazole (PRILOSEC) 20 MG capsule daily as needed.     Marland Kitchen oxyCODONE-acetaminophen (PERCOCET) 7.5-325 MG tablet 1 tablet every 4 (four) hours as needed for severe pain.     . SUMAtriptan (IMITREX) 100 MG tablet TAKE ONE TABLET BY MOUTH AS NEEDED FOR MIGRAINE  OR  HEADACHE  MAY  REPEAT  IN  2  HOURS  IF  HEADACHE  PERSISTS  OR  RECURS 9 tablet 6  . SUMAtriptan 6 MG/0.5ML SOAJ AS DIRECTED FOR  MIGRAINE 9 Syringe 3  . SYNTHROID 50 MCG tablet Take 50 mcg by mouth every morning.    . temazepam (RESTORIL) 30 MG capsule Take 30 mg by mouth at bedtime as needed for sleep.    . Topiramate ER (TROKENDI XR) 100 MG CP24 Take 100 mg by mouth at bedtime. 30 capsule 11  . tretinoin (RETIN-A) 0.025 % cream Apply 1 application topically at bedtime.      No facility-administered medications prior to visit.     PAST MEDICAL HISTORY: Past Medical History:  Diagnosis Date  . Bowel obstruction ([place])   . Breast cancer (  [place])   . Headache(784.0)   . Migraine   . Vision abnormalities    glaucoma    PAST SURGICAL HISTORY: Past Surgical History:  Procedure Laterality Date  . BREAST LUMPECTOMY    . TOTAL ABDOMINAL HYSTERECTOMY N/A     FAMILY HISTORY: Family History  Problem Relation Age of Onset  . Heart disease Mother   . Headache Mother   . Heart attack Father     SOCIAL HISTORY: Social History   Social History  . Marital status: Single    Spouse name: Gwyndolyn Saxon   . Number of children: 0  . Years of education: Assoc   Occupational History  . Not on file.   Social History Main Topics  . Smoking status: Never Smoker  . Smokeless tobacco: Never Used  . Alcohol use Yes     Comment: drinks on the weekend  . Drug use: No  . Sexual activity: Not on file   Other Topics Concern  . Not on file   Social History Narrative   Patient  lives at home with her husband. Gwyndolyn Saxon    Patient has an Geophysicist/field seismologist.    Patient has no children.    Patient is right handed.      PHYSICAL EXAM  Vitals:   07/02/16 1543  BP: 108/69  Pulse: 74  Weight: 128 lb 9.6 oz (58.3 kg)   Body mass index is 25.12 kg/m. Generalized: Well developed,  female in no acute distress  Head: normocephalic and atraumatic,. Oropharynx benign  Neck: Supple, no carotid bruits  Cardiac: Regular rate rhythm, no murmur  Musculoskeletal: No deformity   Neurological examination   Mentation: Alert oriented to time, place, history taking. Attention span and concentration appropriate. Recent and remote memory intact. Follows all commands speech and language fluent.   Cranial nerve II-XII: Pupils were equal round reactive to light extraocular movements were full, visual field were full on confrontational test. Facial sensation and strength were normal. hearing was intact to finger rubbing bilaterally. Uvula tongue midline. head turning and shoulder shrug were normal and symmetric.Tongue protrusion into cheek strength was normal. Motor: normal bulk and tone, full strength in the BUE, BLE, fine finger movements normal, no pronator drift. No focal weakness Sensory: normal and symmetric to light touch, pinprick, and Vibration In the upper and lower extremities, Coordination: finger-nose-finger, heel-to-shin bilaterally, no dysmetria Reflexes: 1+ upper lower and symmetric plantar responses were flexor bilaterally. Gait and Station: Rising up from seated position without assistance, normal stance, moderate stride, good arm swing, smooth turning, able to perform tiptoe, and heel walking without difficulty. Tandem gait is steady DIAGNOSTIC DATA (LABS, IMAGING, TESTING) - I reviewed patient records, labs, notes, testing and imaging myself where available.  Lab Results  Component Value Date   WBC 7.2 11/21/2015   HGB 12.5 11/21/2015   HCT 37.8  11/21/2015   MCV 99.5 11/21/2015   PLT 239 11/21/2015      Component Value Date/Time   NA 139 11/21/2015 1104   K 3.5 11/21/2015 1104   CL 110 11/21/2015 1104   CO2 22 11/21/2015 1104   GLUCOSE 103 (H) 11/21/2015 1104   BUN 7 11/21/2015 1104   CREATININE 0.70 11/21/2015 1104   CALCIUM 9.4 11/21/2015 1104   PROT 6.7 11/21/2015 1104   ALBUMIN 4.0 11/21/2015 1104   AST 20 11/21/2015 1104   ALT 14 11/21/2015 1104   ALKPHOS 47 11/21/2015 1104   BILITOT 0.7 11/21/2015 1104   GFRNONAA >60 11/21/2015 1104  GFRAA >60 11/21/2015 1104    ASSESSMENT AND PLAN  71 y.o. year old female  has a past medical history of Bowel obstruction ([place]); Breast cancer ([place]); Headache(784.0); Migraine; and Vision abnormalities. here To follow-up for her migraines. She has a long history of chronic pain and is seen at a  pain clinic in [place].    PLAN: Continue trokendi XR 100 mg every day,  Continue Imitrex as needed  Will refill Continue follow-up with pain management for chronic pain Try to exercise water aerobics would be great also just walking for exercise Follow-up here in 6 months Next with DR Luan Pulling, [facility] [place], [facility], APRN  [facility] [address] [place], [place] [address] [phone]

## 2016-07-02 NOTE — Patient Instructions (Signed)
Continue trokendi XR 100 mg every day,  Continue Imitrex as needed  Will refill Continue follow-up with pain management Try to exercise water aerobics would be great Follow-up here in 6 months Next with DR Krista Blue

## 2016-07-04 NOTE — Progress Notes (Signed)
I have reviewed and agreed above plan. 

## 2016-09-05 ENCOUNTER — Other Ambulatory Visit: Payer: Self-pay | Admitting: Neurology

## 2016-10-30 ENCOUNTER — Telehealth: Payer: Self-pay | Admitting: Nurse Practitioner

## 2016-10-30 NOTE — Telephone Encounter (Signed)
Patient is calling regarding neuropathy. She has had problems for a long time and has tried Neurotin and Gabapentin but has not helped. Does she need to see someone else? Neuropathy is in both legs, feet and  in hands. She is unable to sleep because the pain is so severe. Please call and discuss.

## 2016-10-30 NOTE — Telephone Encounter (Signed)
Returned call to patient - she would like to be evaluated by Dr. Krista Blue.  Her November appt was rescheduled to an earlier date.

## 2016-10-30 NOTE — Telephone Encounter (Signed)
Called patient and inquired if she has discussed her concerns with her PCP. She stated she has for quite a while; she's also discussed with endocrinologist. She had test for diabetes which was neg. She goes to a pain clinic monthly for this same issue and initially was receiving injections which didn't help. She was prescribed Oxycodone which takes the edge off , however she stated she does not want to take that all the time.  She had been prescribed gabapentin in the past, but couldn't tolerate it. She stated "it made me feel crazy". She reported that she has burning pain in both legs, feet and into her buttocks. It is getting worse so that she is  unable to sleep. She also stated she has  swelling that comes and goes in legs, feet and hands. She stated she is concerned that the burning is now getting into her hands as well. She last saw her PCP 3 months ago. She goes to the pain clinic monthly due to Rx of narcotic. This RN asked if she had discussed this recently with provider at pain clinic; she has not. She stated she has always received such good care in this office, so she called here first. This RN reminded her of her FU with  Dr Krista Blue 12/26/16, but stated will route this note to Dr Rhea Belton RN. Advised she will most likely get a call back from [place], [place] later on to discuss Dr Rhea Belton reply. Patient verbalized understanding, appreciation of call back.

## 2016-12-04 ENCOUNTER — Ambulatory Visit: Payer: Self-pay | Admitting: Neurology

## 2016-12-12 ENCOUNTER — Telehealth: Payer: Self-pay | Admitting: Nurse Practitioner

## 2016-12-12 MED ORDER — SUMATRIPTAN SUCCINATE 100 MG PO TABS: ORAL_TABLET | ORAL | 6 refills | Status: DC

## 2016-12-12 NOTE — Telephone Encounter (Signed)
Pt request refill for SUMAtriptan (IMITREX) 100 MG tablet sent to [facility]. Pt said they will not be renewing membership to [employer]

## 2016-12-26 ENCOUNTER — Ambulatory Visit: Payer: Medicare Other | Admitting: Neurology

## 2017-02-25 ENCOUNTER — Ambulatory Visit: Payer: Self-pay | Admitting: Neurology

## 2017-05-12 ENCOUNTER — Ambulatory Visit (INDEPENDENT_AMBULATORY_CARE_PROVIDER_SITE_OTHER): Payer: Medicare Other | Admitting: Neurology

## 2017-05-12 ENCOUNTER — Encounter: Payer: Self-pay | Admitting: Neurology

## 2017-05-12 ENCOUNTER — Other Ambulatory Visit: Payer: Self-pay | Admitting: *Deleted

## 2017-05-12 VITALS — BP 132/67 | HR 94 | Ht 60.0 in | Wt 130.5 lb

## 2017-05-12 DIAGNOSIS — M79606 Pain in leg, unspecified: Secondary | ICD-10-CM

## 2017-05-12 DIAGNOSIS — G43909 Migraine, unspecified, not intractable, without status migrainosus: Secondary | ICD-10-CM

## 2017-05-12 MED ORDER — NORTRIPTYLINE HCL 10 MG PO CAPS: 20.0000 mg | ORAL_CAPSULE | Freq: Every day | ORAL | 11 refills | Status: DC

## 2017-05-12 MED ORDER — TOPIRAMATE 100 MG PO TABS: 100.0000 mg | ORAL_TABLET | Freq: Every day | ORAL | 11 refills | Status: DC

## 2017-05-12 NOTE — Progress Notes (Signed)
[facility]  PATIENT: Jill Salinas DOB: [birth year]  HISTORY OF PRESENT ILLNESS:  She has migraines for many many years currently well controlled on 200 mg of Topamax at night. She also takes gabapentin 959m daily, which has helped her migraines as well. She uses Imitrex acutely both the injectable and the oral preparation. She thinks her headaches are in good control. She used imitrex po first, if that does not take care of her headaches, she would use injections.  This an early visit for her complains of 2 months history of right side neck pain, right occipital area pain, it bothers her 2/week, lasting all day, she felt spacy dealing with the pain, she does not like to drive,  She also had trouble walking, do not have the strength to get up an even steps, xone year, she has no incontinence, more so on the left leg, no involvement of left arm, she also has left knee swelling, felt so bad. She had 30 Lb weight gain over one year "can not get into any of my cloth" She also complains of low back pain, radiating pain to her left leg, has tried physical therapy recently, which has been helpful, no incontinence.   UPDATE June 24th 2015:YY EMG/NCS in June 24th 2015 showed no evidence of left lumbar radiculopathy, there was evidence of right triceps irritation, but no neuropathic changes at other selected right upper extremity needle examination.  We have reviewed MRI lumbar film together, only mild degenerative disc disease, there was no significant foraminal, or canal stenosis   She is taking Cymbalta now, which has helped her some, but continued complaint bilateral lower extremity deep achy pain, especially at nighttime, she no longer has shooting pain from her neck to her shoulders, or arms, but she complains of deep achy pain in her midline upper cervical region,  She also complains fatigued easily, lack of stamina She had left lumbar epidural injection, by  interventional radiologist Dr. CLawrence Santiagoat left L4-5 space, in August 24th 2015, she only had transient improvement,  Now she complains of left lower extremity pain, bilateral lower extremity deep achy pain, feet paresthesia, gait difficulty,  UPDATE April 25th 2016:YY She complains of two months history of neck pain, spreading forward to bilateral retrorbital area, daily, can go up to 10/10, radiating to her right shoulder, woke up at night with headaches,has been using frequent Imitrex tablet, sometimes injection  She has history of migraine all her life, previous headaches is lateralized severe pounding headache, now it is at her neck, She tries to sleep different ways,Change pillows without helping  She is under pain management for her low back pain,is taking daily hydrocodone, tried Elavil before, complains of dizziness, weight gain.  Update July third 2017:YY She has severe constipation, has not used bathroom for 3 months,  She complains of bloating, tried different methods, medications, abdomen massage,  Her migraine has much improved, she has migraines headaches every few times, she is taking imitrex as needed, which has been helpful she is worried about side effect of Trokendi xr, I will decrease the dosage from 2056mto 73mhs.  UPDATE May 12 2017: Today she coming with different complaints, complains of frequent awakening at nighttime, drenching sweats, pain in her legs, difficulty falling to sleep, chronic constipation,  Laboratory evaluations in October 2017 showed mild anemia hemoglobin of 12.5, normal CMP,  MRI of cervical and lumbar spine 2015 showed degenerative changes, but there was no significant canal or foraminal  narrowing.   REVIEW OF SYSTEMS: Full 14 system review of systems performed and notable only for those listed, all others are neg:   Eye itching, light sensitivity, blurry vision, excessive sweating, leg swelling, swollen abdomen, abdominal pain,  black stool, constipation, nausea, daytime sleepiness, memory loss, dizziness, numbness, weakness   ALLERGIES: Allergies  Allergen Reactions  . Iodinated Diagnostic Agents Other (See Comments)    Tthroat itching Tthroat itching, pt received a 13 hour prep for injection  . Codeine   . Depakote [Divalproex Sodium] Nausea And Vomiting  . Gabapentin Other (See Comments)    Made her "feel crazy"    HOME MEDICATIONS: Outpatient Medications Prior to Visit  Medication Sig Dispense Refill  . buPROPion (WELLBUTRIN XL) 150 MG 24 hr tablet Take 150 mg by mouth daily.    Marland Kitchen denosumab (PROLIA) 60 MG/ML SOLN injection Inject 60 mg into the skin every 6 (six) months. Administer in upper arm, thigh, or abdomen    . DULoxetine (CYMBALTA) 60 MG capsule 60 mg daily.     Marland Kitchen LUMIGAN 0.01 % SOLN Place 1 drop into both eyes at bedtime.    . magnesium hydroxide (MILK OF MAGNESIA) 400 MG/5ML suspension Take by mouth daily as needed for mild constipation.    . modafinil (PROVIGIL) 200 MG tablet Take 200 mg by mouth daily.   1  . oxyCODONE-acetaminophen (PERCOCET) 10-325 MG tablet TAKE 1 TABLET BY MOUTH EVERY 4 HOURS AROUND THE CLOCK **MAX 5 TABLETS/DAY**  0  . polyethylene glycol (MIRALAX / GLYCOLAX) packet Take 17 g by mouth daily.    . rosuvastatin (CRESTOR) 5 MG tablet Take 5 mg by mouth daily.  3  . SUMAtriptan (IMITREX) 100 MG tablet TAKE ONE TABLET BY MOUTH AS NEEDED FOR MIGRAINE  OR  HEADACHE  MAY  REPEAT  IN  2  HOURS  IF  HEADACHE  PERSISTS  OR  RECURS 9 tablet 6  . SUMAtriptan 6 MG/0.5ML SOAJ AS DIRECTED FOR  MIGRAINE 9 Syringe 3  . tretinoin (RETIN-A) 0.025 % cream Apply 1 application topically at bedtime.     Marland Kitchen TROKENDI XR 100 MG CP24 TAKE ONE CAPSULE BY MOUTH EVERY DAY AT BEDTIME 90 capsule 3  . Vitamin D, Ergocalciferol, (DRISDOL) 50000 units CAPS capsule Take 50,000 Units by mouth every 7 (seven) days.    . cyclobenzaprine (FLEXERIL) 10 MG tablet Take 10 mg by mouth 3 (three) times daily.  2  .  docusate sodium (COLACE) 100 MG capsule Take 1 capsule (100 mg total) by mouth every 12 (twelve) hours. 60 capsule 0  . DULoxetine (CYMBALTA) 30 MG capsule Take 30 mg by mouth daily.     Marland Kitchen gatifloxacin (ZYMAXID) 0.5 % SOLN PLACE 1 DROP 4 TIMES A DAY STARTING 2 DAYS PRIOR TO SURGERY AND 2 DROPS MORNING OF SURGERY  0  . prednisoLONE acetate (PRED FORTE) 1 % ophthalmic suspension INSTILL 1 DROP IN LEFT EYE 4 TIMES A DAY AS DIRECTED  0  . promethazine (PHENERGAN) 25 MG tablet TAKE 1 TABLET BY MOUTH EVERY 6 HOURS AS NEEDED FOR 30 DAYS  2  . SYNTHROID 50 MCG tablet Take 50 mcg by mouth every morning.    . temazepam (RESTORIL) 30 MG capsule Take 30 mg by mouth at bedtime as needed for sleep.     No facility-administered medications prior to visit.     PAST MEDICAL HISTORY: Past Medical History:  Diagnosis Date  . Bowel obstruction ([place])   . Breast cancer ([place])   .  Headache(784.0)   . Migraine   . Vision abnormalities    glaucoma    PAST SURGICAL HISTORY: Past Surgical History:  Procedure Laterality Date  . BREAST LUMPECTOMY    . TOTAL ABDOMINAL HYSTERECTOMY N/A     FAMILY HISTORY: Family History  Problem Relation Age of Onset  . Heart disease Mother   . Headache Mother   . Heart attack Father     SOCIAL HISTORY: Social History   Socioeconomic History  . Marital status: Single    Spouse name: Gwyndolyn Saxon   . Number of children: 0  . Years of education: Assoc  . Highest education level: Not on file  Occupational History  . Not on file  Social Needs  . Financial resource strain: Not on file  . Food insecurity:    Worry: Not on file    Inability: Not on file  . Transportation needs:    Medical: Not on file    Non-medical: Not on file  Tobacco Use  . Smoking status: Never Smoker  . Smokeless tobacco: Never Used  Substance and Sexual Activity  . Alcohol use: Yes    Comment: drinks on the weekend  . Drug use: No  . Sexual activity: Not on file  Lifestyle  . Physical  activity:    Days per week: Not on file    Minutes per session: Not on file  . Stress: Not on file  Relationships  . Social connections:    Talks on phone: Not on file    Gets together: Not on file    Attends religious service: Not on file    Active member of club or organization: Not on file    Attends meetings of clubs or organizations: Not on file    Relationship status: Not on file  . Intimate partner violence:    Fear of current or ex partner: Not on file    Emotionally abused: Not on file    Physically abused: Not on file    Forced sexual activity: Not on file  Other Topics Concern  . Not on file  Social History Narrative   Patient lives at home with her husband. Gwyndolyn Saxon    Patient has an Geophysicist/field seismologist.    Patient has no children.    Patient is right handed.      PHYSICAL EXAM  Vitals:   05/12/17 1502  BP: 132/67  Pulse: 94  Weight: 130 lb 8 oz (59.2 kg)  Height: 5' (1.524 m)   Body mass index is 25.49 kg/m. Generalized: Well developed,  female in no acute distress  Head: normocephalic and atraumatic,. Oropharynx benign  Neck: Supple, no carotid bruits  Cardiac: Regular rate rhythm, no murmur  Musculoskeletal: No deformity   Neurological examination   Mentation: Alert oriented to time, place, history taking. Attention span and concentration appropriate. Recent and remote memory intact. Follows all commands speech and language fluent.   Cranial nerve II-XII: Pupils were equal round reactive to light extraocular movements were full, visual field were full on confrontational test. Facial sensation and strength were normal. hearing was intact to finger rubbing bilaterally. Uvula tongue midline. head turning and shoulder shrug were normal and symmetric.Tongue protrusion into cheek strength was normal. Motor: normal bulk and tone, full strength in the BUE, BLE, fine finger movements normal, no pronator drift. No focal weakness Sensory: normal and  symmetric to light touch, pinprick, and Vibration In the upper and lower extremities, Coordination: finger-nose-finger, heel-to-shin bilaterally, no dysmetria Reflexes: 1+ upper  lower and symmetric plantar responses were flexor bilaterally. Gait and Station: Rising up from seated position without assistance, normal stance, moderate stride, good arm swing, smooth turning, able to perform tiptoe, and heel walking without difficulty. Tandem gait is steady DIAGNOSTIC DATA (LABS, IMAGING, TESTING) - I reviewed patient records, labs, notes, testing and imaging myself where available.  Lab Results  Component Value Date   WBC 7.2 11/21/2015   HGB 12.5 11/21/2015   HCT 37.8 11/21/2015   MCV 99.5 11/21/2015   PLT 239 11/21/2015      Component Value Date/Time   NA 139 11/21/2015 1104   K 3.5 11/21/2015 1104   CL 110 11/21/2015 1104   CO2 22 11/21/2015 1104   GLUCOSE 103 (H) 11/21/2015 1104   BUN 7 11/21/2015 1104   CREATININE 0.70 11/21/2015 1104   CALCIUM 9.4 11/21/2015 1104   PROT 6.7 11/21/2015 1104   ALBUMIN 4.0 11/21/2015 1104   AST 20 11/21/2015 1104   ALT 14 11/21/2015 1104   ALKPHOS 47 11/21/2015 1104   BILITOT 0.7 11/21/2015 1104   GFRNONAA >60 11/21/2015 1104   GFRAA >60 11/21/2015 1104    ASSESSMENT AND PLAN  72 y.o. year old female   Bilateral lower extremity pain Chronic migraine headaches  Topamax extended release works well for her migraine headaches, she complains of high co-pay, will change to Topamax 100mg  qhs  Nortriptyline, 10 mg titrating to 20 mg every night as migraine prevention  Laboratory evaluations, including B12, TSH  MRI of the cervical and lumbar spine    Marcial Pacas, M.D. Ph.D.  [facility] [place], [place] [address] Phone: [phone] Fax:      [phone]

## 2017-05-13 ENCOUNTER — Telehealth: Payer: Self-pay | Admitting: *Deleted

## 2017-05-13 LAB — RPR: RPR Ser Ql: NONREACTIVE

## 2017-05-13 LAB — SEDIMENTATION RATE: SED RATE: 29 mm/h (ref 0–40)

## 2017-05-13 LAB — C-REACTIVE PROTEIN: CRP: 5.9 mg/L — ABNORMAL HIGH (ref 0.0–4.9)

## 2017-05-13 LAB — TSH: TSH: 2.29 u[IU]/mL (ref 0.450–4.500)

## 2017-05-13 LAB — CK: CK TOTAL: 65 U/L (ref 24–173)

## 2017-05-13 LAB — VITAMIN D 25 HYDROXY (VIT D DEFICIENCY, FRACTURES): VIT D 25 HYDROXY: 67.5 ng/mL (ref 30.0–100.0)

## 2017-05-13 LAB — HIV ANTIBODY (ROUTINE TESTING W REFLEX): HIV Screen 4th Generation wRfx: NONREACTIVE

## 2017-05-13 LAB — VITAMIN B12: Vitamin B-12: 636 pg/mL (ref 232–1245)

## 2017-05-13 NOTE — Telephone Encounter (Signed)
Spoke to patient she is aware of results

## 2017-05-13 NOTE — Telephone Encounter (Signed)
-----  Message from Marcial Pacas, MD sent at 05/13/2017 10:18 AM EDT ----- Please call patient for mild elevated C-reactive protein, in the setting of normal ESR, it has unknown clinical significance.  Rest of the laboratory evaluations were normal.

## 2017-05-22 ENCOUNTER — Telehealth: Payer: Self-pay | Admitting: Neurology

## 2017-05-22 NOTE — Telephone Encounter (Signed)
Spoke to patient - she is going to stay on the topiramate (Trokendi is expensive).  Additionally, she will try the nortriptyline 10mg  at bedtime for 1-2 weeks then attempt to increase the dose to 20mg .  States she is under a lot of stress because she is moving back to [place].  Sumatriptan is working well for her pain.

## 2017-05-22 NOTE — Telephone Encounter (Signed)
Pt calling to inform that she is having break thru head aches with this topiramate (TOPAMAX) 100 MG tablet.  She states that she has gone back to the Trokendi but would like a call back with other recommendation from Dr Krista Blue for something that will not cause her to be so exhausted.  Please call

## 2017-06-04 ENCOUNTER — Other Ambulatory Visit: Payer: Self-pay | Admitting: *Deleted

## 2017-06-04 MED ORDER — NORTRIPTYLINE HCL 10 MG PO CAPS: 20.0000 mg | ORAL_CAPSULE | Freq: Every day | ORAL | 3 refills | Status: DC

## 2017-06-30 ENCOUNTER — Encounter: Payer: Self-pay | Admitting: *Deleted

## 2017-06-30 ENCOUNTER — Telehealth: Payer: Self-pay | Admitting: Neurology

## 2017-06-30 NOTE — Telephone Encounter (Addendum)
Per vo by Dr. Krista Blue, stop the nortriptyline.  If her bladder symptoms and swelling do not resolve after the removal of this medication, she was instructed to contact her PCP for further evaluation.  She was in agreement with this plan.

## 2017-06-30 NOTE — Telephone Encounter (Addendum)
Reports swelling throughout body and difficulty with urination (she goes to the bathroom regularly but does not feel she is completely emptying her bladder). States these symptoms started after taking nortriptyline 10mg  at bedtime for several weeks.  Says nothing else has changed other than the addition of this medication.  Feels her migraines are well controlled with topiramate 100mg  at bedtime.

## 2017-06-30 NOTE — Telephone Encounter (Signed)
Pt called stating that due to nortriptyline (PAMELOR) 10 MG capsule she has become swollen, and unable to fully use the bathroom. Pt requesting a prescription for fluid tablets or to switch medication. Please call to advise

## 2017-08-26 ENCOUNTER — Other Ambulatory Visit: Payer: Self-pay | Admitting: *Deleted

## 2017-08-26 ENCOUNTER — Telehealth: Payer: Self-pay | Admitting: Neurology

## 2017-08-26 MED ORDER — SUMATRIPTAN SUCCINATE 6 MG/0.5ML ~~LOC~~ SOAJ: SUBCUTANEOUS | 3 refills | Status: DC

## 2017-08-26 NOTE — Telephone Encounter (Signed)
Refill sent to requested pharmacy.

## 2017-08-26 NOTE — Telephone Encounter (Signed)
Pt has called for a refill on her SUMAtriptan 6 MG/0.5ML SOAJ Pt is asking that this be called into the [facility] [phone].  Pt states if RN needs to call her and a message be left, she is asking that message be left on husbands cell phone which is [phone]

## 2017-09-03 ENCOUNTER — Telehealth: Payer: Self-pay | Admitting: Neurology

## 2017-09-03 ENCOUNTER — Other Ambulatory Visit: Payer: Self-pay | Admitting: *Deleted

## 2017-09-03 MED ORDER — TOPIRAMATE 100 MG PO TABS
ORAL_TABLET | ORAL | 11 refills | Status: DC
Start: 2017-09-03 — End: 2018-06-12

## 2017-09-03 NOTE — Telephone Encounter (Signed)
Called pt, LVM returning her call.

## 2017-09-03 NOTE — Telephone Encounter (Addendum)
Spoke to patient - she tolerates topiramate well without adverse side effects but recently she notes an increase in migraine frequency.  She is unable to afford the co-pay for topiramate extended release, even with insurance benefits.  Per vo by Dr. Krista Blue, increase topirmate 100mg  at bedtime to 200mg  at bedtime.  The patient is agreeable to this plan.  A new prescription has been sent to the pharmacy.  She will give this new dose a few weeks to work.  Instructed her to call back if it is not helpful.  Per vo by Dr. Krista Blue, if the increased dose of topiramate is ineffective then she can try Aimovig, injecting 70mg  per month.

## 2017-09-03 NOTE — Telephone Encounter (Signed)
Pt called office back. She has been having a headache intermittently for the last two weeks. HA started all over and now only on right side and behind right eye. Pain is radiating down into neck on the right side. She is taking topiramate daily as preventative. She has tried: Sumatriptan inj. twice-ineffective. Headache kept coming back. She tried the sumatriptan tablet but it was ineffective. Verified she is drinking plenty of water, eating. However she is not sleeping well d/t headache. She stayed in bed all day today. She was nauseous a little today. Normally she is not nauseous. She is wanting to know Dr. Rhea Belton suggestions and if she should go back ot topiramate XR. Advised I wills speak to Dr. Krista Blue and call her back to let her know.

## 2017-09-03 NOTE — Telephone Encounter (Signed)
Pt called stating she has been experiencing an on and off headache for about 2 weeks now. Stating that she has been taking topiramate (TOPAMAX) 100 MG tablet as prescribed. Pt requesting RN call her husband Rush Landmark due to the intense headache to discuss something else being called in as well. Please call to advise

## 2017-11-03 ENCOUNTER — Other Ambulatory Visit: Payer: Self-pay | Admitting: Neurology

## 2017-12-10 ENCOUNTER — Other Ambulatory Visit (HOSPITAL_COMMUNITY): Payer: Self-pay | Admitting: Family Medicine

## 2017-12-10 DIAGNOSIS — I739 Peripheral vascular disease, unspecified: Secondary | ICD-10-CM

## 2017-12-11 ENCOUNTER — Ambulatory Visit (HOSPITAL_COMMUNITY)
Admission: RE | Admit: 2017-12-11 | Discharge: 2017-12-11 | Disposition: A | Discharge status: Home / Self Care | Payer: Medicare Other | Source: Ambulatory Visit | Attending: Cardiology | Admitting: Cardiology

## 2017-12-11 DIAGNOSIS — I739 Peripheral vascular disease, unspecified: Secondary | ICD-10-CM | POA: Diagnosis not present

## 2018-01-26 ENCOUNTER — Other Ambulatory Visit: Payer: Self-pay | Admitting: Physician Assistant

## 2018-01-26 ENCOUNTER — Ambulatory Visit
Admission: RE | Admit: 2018-01-26 | Discharge: 2018-01-26 | Disposition: A | Discharge status: Home / Self Care | Payer: Medicare Other | Source: Ambulatory Visit | Attending: Physician Assistant | Admitting: Physician Assistant

## 2018-01-26 DIAGNOSIS — R0789 Other chest pain: Secondary | ICD-10-CM

## 2018-01-26 DIAGNOSIS — R071 Chest pain on breathing: Secondary | ICD-10-CM

## 2018-01-26 IMAGING — DX DG ABDOMEN 1V
1 series · 1 of 1 positions shown · non-contrast
Comparison: [DATE] abdominal CT

CLINICAL DATA: Left upper quadrant pain

EXAM:
ABDOMEN - 1 VIEW

[dg abd 1 view]
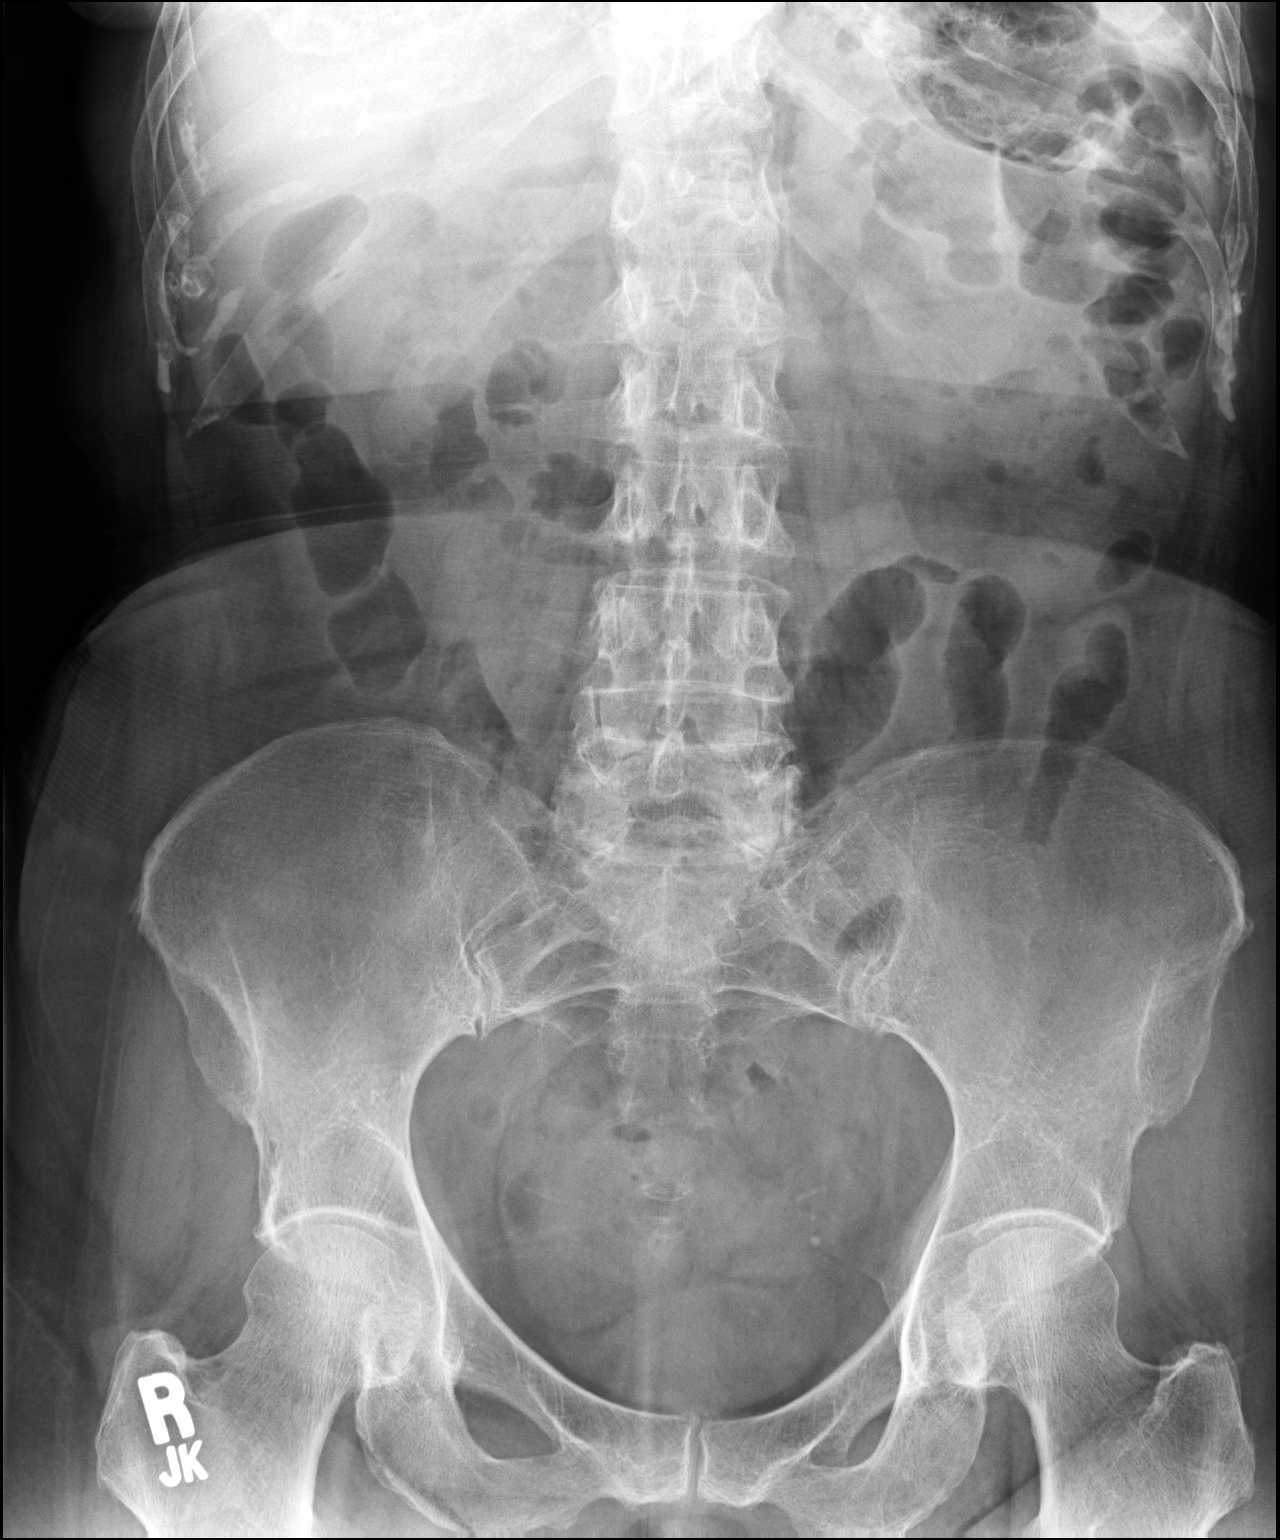

[1 of 1 positions shown; findings below may reference images not displayed]

FINDINGS: The bowel gas pattern is normal. No radio-opaque calculi or
concerning mass effect. Left pelvic calcifications are phleboliths.
IMPRESSION: Normal bowel gas pattern.

## 2018-01-26 IMAGING — DX DG RIBS W/ CHEST 3+V*L*
3 series · 3 of 3 positions shown · non-contrast
Comparison: None.

CLINICAL DATA: 72-year-old female with chest pain

EXAM:
LEFT RIBS AND CHEST - 3+ VIEW

[dg ribs unilateral w/chest left (1 of 3)]
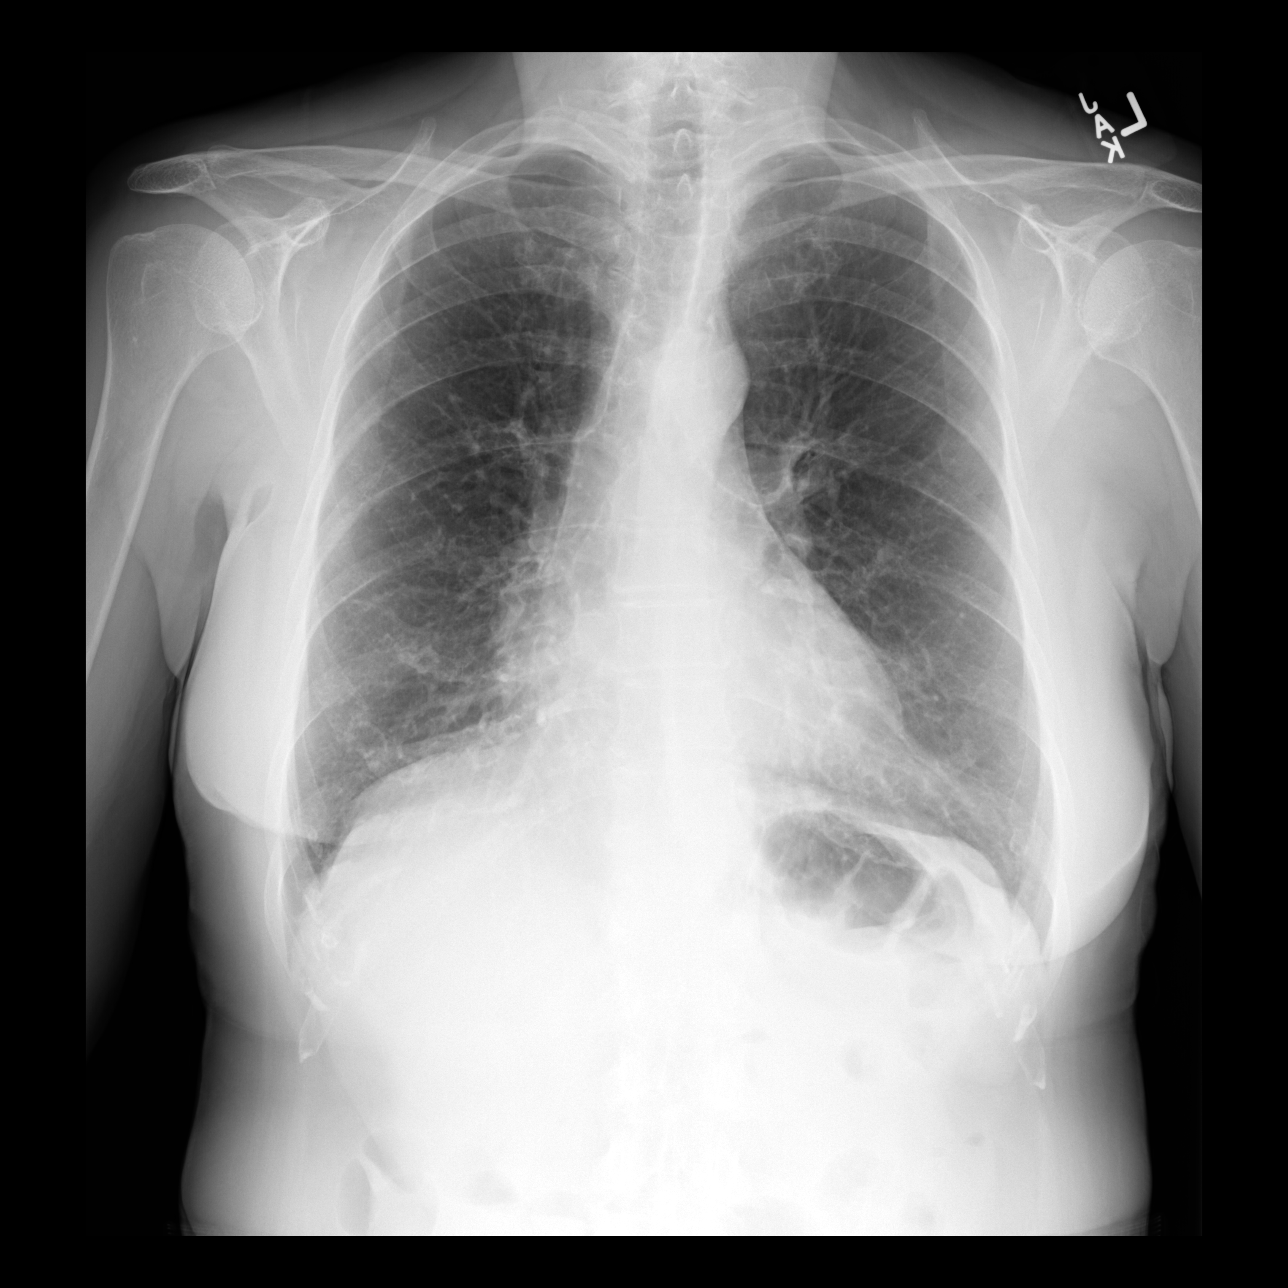

[dg ribs unilateral w/chest left (2 of 3)]
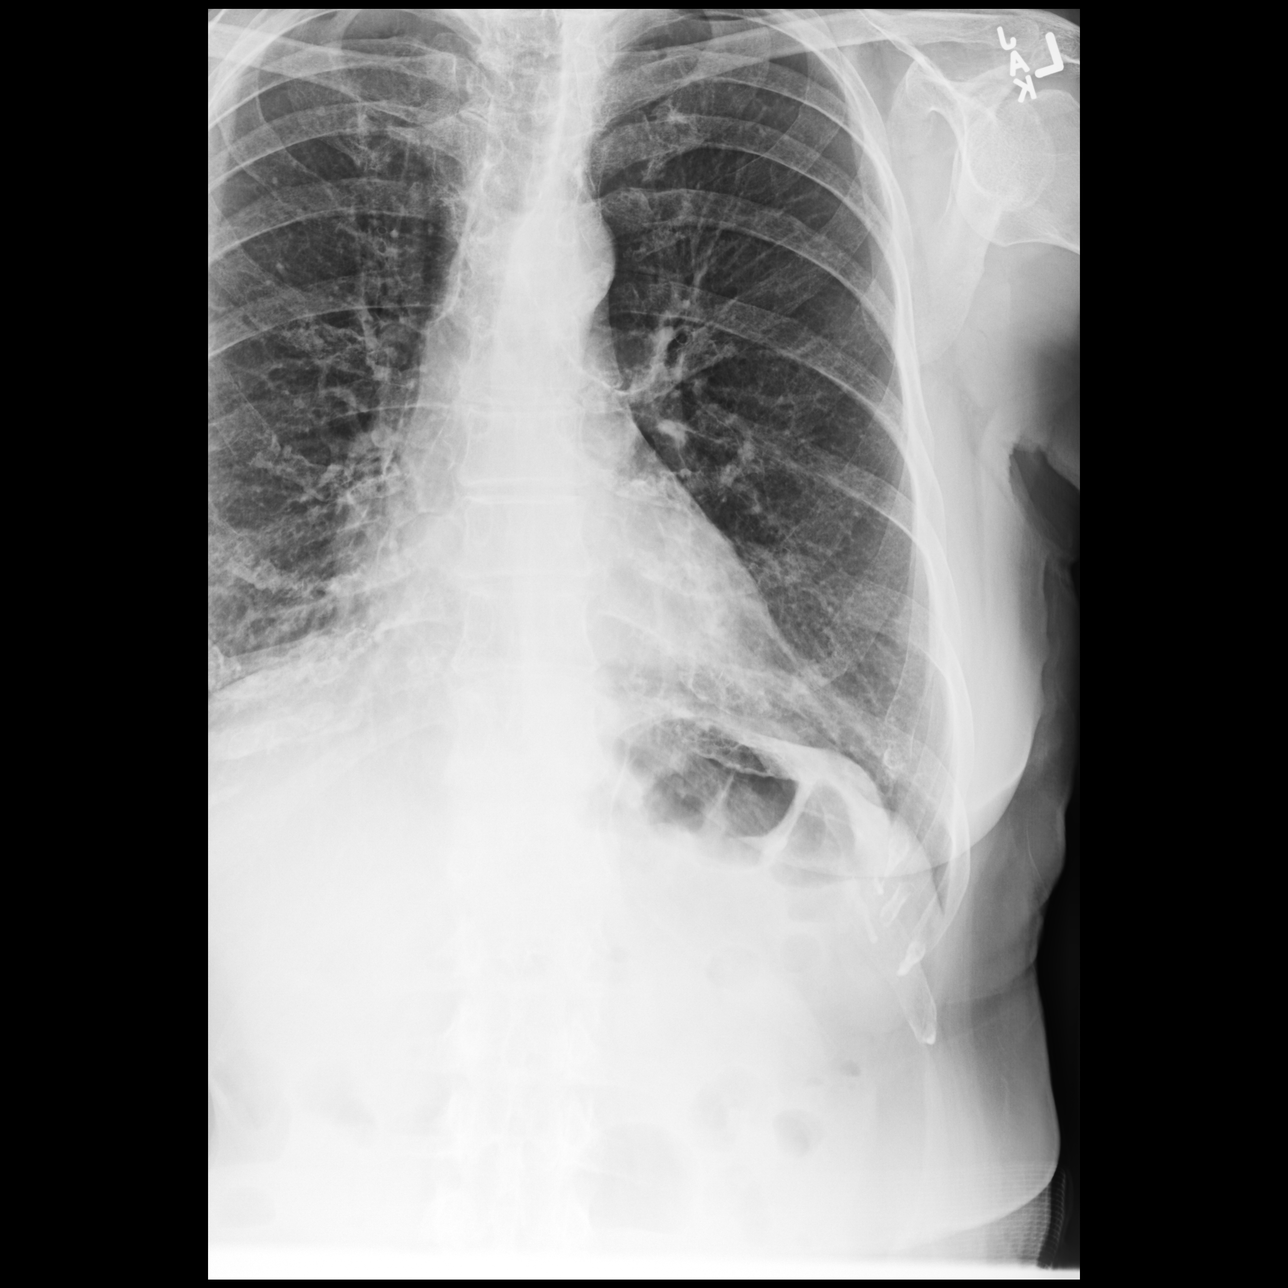

[dg ribs unilateral w/chest left (3 of 3)]
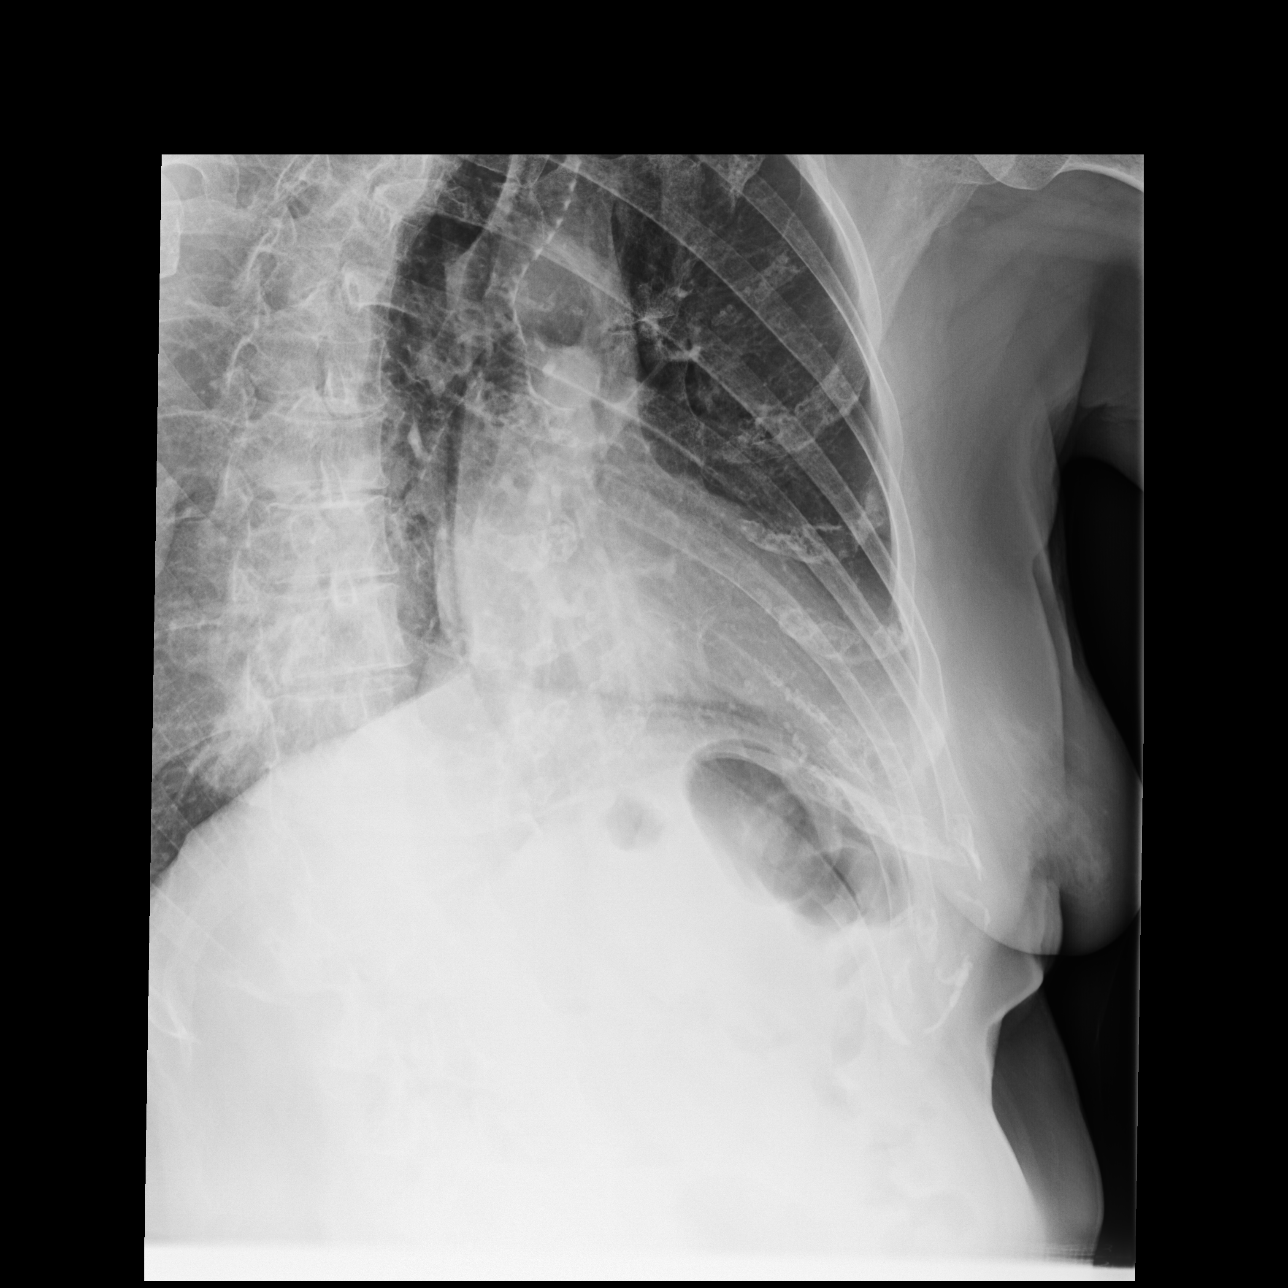

[3 of 3 positions shown; findings below may reference images not displayed]

FINDINGS: Cardiomediastinal silhouette within normal limits in size and
contour.

No central vascular congestion. No interlobular septal thickening.
No confluent airspace disease. No pleural effusion. Coarsened
interstitial markings.
IMPRESSION: Likely chronic lung changes without evidence of acute
cardiopulmonary disease.

No acute displaced rib fracture.

## 2018-03-24 ENCOUNTER — Other Ambulatory Visit: Payer: Self-pay | Admitting: Family Medicine

## 2018-03-24 DIAGNOSIS — E2839 Other primary ovarian failure: Secondary | ICD-10-CM

## 2018-04-01 ENCOUNTER — Telehealth: Payer: Self-pay | Admitting: Neurology

## 2018-04-01 NOTE — Telephone Encounter (Addendum)
Pt said last Friday during the night she experienced a bad HA on the right side with a quick "tremendous" pain, like a lightening bolt pain that felt like it came out the right ear. Over the next couple of days she has not been able to see very well, slept 4-5 days only getting up to go to the bathroom or eat-but this was a struggle, legs were weak, body very tired. Now her eyes are still blurry, still weak although she is walking better.  I did not schedule an appt for her since Dr Krista Blue is booked into April. Please call to advise

## 2018-04-01 NOTE — Telephone Encounter (Signed)
Returned call to the patient.  History of migraines. States after a brief, sharp pain in her head last Friday, she felt dizziness, generalized weakness/fatigue, and had blurred vision.  States she has not slept well over the last week.  She has started to feel better over the last few days and has returned to baseline with the exception of fatigue.  Her vision is also better with just a little blurriness when reading but distance is fine.  She is not having any difficulty with walking.  Per vo by Dr. Krista Blue, since she is returning to baseline and the pain is resolved.  It is okay to just monitor symptoms.  She is agreeable to this plan since she is feeling significantly better.  She understands to call us back with any further concerns.  She understands to proceed to the ED, if she feels she is having life threatening symptoms.

## 2018-05-19 ENCOUNTER — Other Ambulatory Visit: Payer: Medicare Other

## 2018-06-09 ENCOUNTER — Telehealth: Payer: Self-pay | Admitting: Neurology

## 2018-06-09 NOTE — Telephone Encounter (Signed)
Pt is asking for a call to discuss the trouble she has been having with her headaches. Pt states over a month ago for about 2 weeks she was unable to walk and was told it could have been due to a brain bleed.  Pt states she never went to Dr but would like a call from RN to discuss

## 2018-06-09 NOTE — Telephone Encounter (Signed)
She was last seen in April 2019.  Her migraines have become more frequent.  The would like to discuss her headache management in addition to the event she experience below.  She consented to a virtual visit and has been scheduled on 06/11/2018.

## 2018-06-11 ENCOUNTER — Other Ambulatory Visit: Payer: Self-pay

## 2018-06-11 ENCOUNTER — Ambulatory Visit (INDEPENDENT_AMBULATORY_CARE_PROVIDER_SITE_OTHER): Payer: Medicare Other | Admitting: Neurology

## 2018-06-11 ENCOUNTER — Telehealth: Payer: Self-pay | Admitting: Neurology

## 2018-06-11 DIAGNOSIS — R51 Headache: Secondary | ICD-10-CM

## 2018-06-11 DIAGNOSIS — R42 Dizziness and giddiness: Secondary | ICD-10-CM | POA: Diagnosis not present

## 2018-06-11 DIAGNOSIS — R519 Headache, unspecified: Secondary | ICD-10-CM | POA: Insufficient documentation

## 2018-06-11 NOTE — Progress Notes (Signed)
[facility]  PATIENT: Jill Salinas DOB: [birth year]  HISTORY OF PRESENT ILLNESS:  She has migraines for many many years currently well controlled on 200 mg of Topamax at night. She also takes gabapentin 959m daily, which has helped her migraines as well. She uses Imitrex acutely both the injectable and the oral preparation. She thinks her headaches are in good control. She used imitrex po first, if that does not take care of her headaches, she would use injections.  This an early visit for her complains of 2 months history of right side neck pain, right occipital area pain, it bothers her 2/week, lasting all day, she felt spacy dealing with the pain, she does not like to drive,  She also had trouble walking, do not have the strength to get up an even steps, xone year, she has no incontinence, more so on the left leg, no involvement of left arm, she also has left knee swelling, felt so bad. She had 30 Lb weight gain over one year "can not get into any of my cloth" She also complains of low back pain, radiating pain to her left leg, has tried physical therapy recently, which has been helpful, no incontinence.   UPDATE June 24th 2015:YY EMG/NCS in June 24th 2015 showed no evidence of left lumbar radiculopathy, there was evidence of right triceps irritation, but no neuropathic changes at other selected right upper extremity needle examination.  We have reviewed MRI lumbar film together, only mild degenerative disc disease, there was no significant foraminal, or canal stenosis   She is taking Cymbalta now, which has helped her some, but continued complaint bilateral lower extremity deep achy pain, especially at nighttime, she no longer has shooting pain from her neck to her shoulders, or arms, but she complains of deep achy pain in her midline upper cervical region,  She also complains fatigued easily, lack of stamina She had left lumbar epidural injection, by  interventional radiologist Dr. CLawrence Santiagoat left L4-5 space, in August 24th 2015, she only had transient improvement,  Now she complains of left lower extremity pain, bilateral lower extremity deep achy pain, feet paresthesia, gait difficulty,  UPDATE April 25th 2016:YY She complains of two months history of neck pain, spreading forward to bilateral retrorbital area, daily, can go up to 10/10, radiating to her right shoulder, woke up at night with headaches,has been using frequent Imitrex tablet, sometimes injection  She has history of migraine all her life, previous headaches is lateralized severe pounding headache, now it is at her neck, She tries to sleep different ways,Change pillows without helping  She is under pain management for her low back pain,is taking daily hydrocodone, tried Elavil before, complains of dizziness, weight gain.  Update July third 2017:YY She has severe constipation, has not used bathroom for 3 months,  She complains of bloating, tried different methods, medications, abdomen massage,  Her migraine has much improved, she has migraines headaches every few times, she is taking imitrex as needed, which has been helpful she is worried about side effect of Trokendi xr, I will decrease the dosage from 2056mto 10013mhs.  UPDATE May 12 2017: Today she coming with different complaints, complains of frequent awakening at nighttime, drenching sweats, pain in her legs, difficulty falling to sleep, chronic constipation,  Laboratory evaluations in October 2017 showed mild anemia hemoglobin of 12.5, normal CMP,  MRI of cervical and lumbar spine 2015 showed degenerative changes, but there was no significant canal or foraminal  narrowing.  Virtual Visit via Video  I connected with Patsy Lager on 06/11/18 at  by Video and verified that I am speaking with the correct person using two identifiers.   I discussed the limitations, risks, security and privacy concerns of  performing an evaluation and management service by video and the availability of in person appointments. I also discussed with the patient that there may be a patient responsible charge related to this service. The patient expressed understanding and agreed to proceed.   History of Present Illness: She is with her husband during today's interview, she complains of frequent headaches since early April 2020, right retro-orbital area, severe, with associated dizziness, unsteady gait, fatigue, excessive daytime sleepiness, during intense headache, she also complains of nausea, frequent right eye twitching, right eyelid droopy, and also bilateral upper and lower jaw deep achy pain,   Observations/Objective: I have reviewed problem lists, medications, allergies.  Awake alert oriented to history taking care of conversation, facial were symmetric, ambulate without difficulty  Assessment and Plan: Chronic headaches  With migraine features,  MRI of the brain to rule out structural lesion  ESR C-reactive protein to rule out temporal arteritis  NSAIDs as needed,  Continue Topamax 100 mg twice a day    Follow Up Instructions:  3 months     I discussed the assessment and treatment plan with the patient. The patient was provided an opportunity to ask questions and all were answered. The patient agreed with the plan and demonstrated an understanding of the instructions.   The patient was advised to call back or seek an in-person evaluation if the symptoms worsen or if the condition fails to improve as anticipated.  I provided 30 minutes of non-face-to-face time during this encounter.   Marcial Pacas, MD

## 2018-06-11 NOTE — Telephone Encounter (Signed)
Medicare/bcbs supp order sent to GI. No auth they will reach out to the pt to schedule.  °

## 2018-06-12 ENCOUNTER — Encounter: Payer: Self-pay | Admitting: Neurology

## 2018-06-12 MED ORDER — TOPIRAMATE 100 MG PO TABS: ORAL_TABLET | ORAL | 11 refills | Status: DC

## 2018-06-17 ENCOUNTER — Other Ambulatory Visit: Payer: Self-pay

## 2018-06-17 ENCOUNTER — Other Ambulatory Visit (INDEPENDENT_AMBULATORY_CARE_PROVIDER_SITE_OTHER): Payer: Self-pay

## 2018-06-17 DIAGNOSIS — R42 Dizziness and giddiness: Secondary | ICD-10-CM

## 2018-06-17 DIAGNOSIS — Z0289 Encounter for other administrative examinations: Secondary | ICD-10-CM

## 2018-06-17 DIAGNOSIS — R51 Headache: Secondary | ICD-10-CM

## 2018-06-17 DIAGNOSIS — R519 Headache, unspecified: Secondary | ICD-10-CM

## 2018-06-18 LAB — C-REACTIVE PROTEIN: CRP: 4 mg/L (ref 0–10)

## 2018-06-18 LAB — SEDIMENTATION RATE: Sed Rate: 12 mm/hr (ref 0–40)

## 2018-06-22 ENCOUNTER — Other Ambulatory Visit: Payer: Self-pay

## 2018-06-22 ENCOUNTER — Ambulatory Visit
Admission: RE | Admit: 2018-06-22 | Discharge: 2018-06-22 | Disposition: A | Discharge status: Home / Self Care | Payer: Medicare Other | Source: Ambulatory Visit | Attending: Neurology | Admitting: Neurology

## 2018-06-22 DIAGNOSIS — R519 Headache, unspecified: Secondary | ICD-10-CM

## 2018-06-22 DIAGNOSIS — R55 Syncope and collapse: Secondary | ICD-10-CM

## 2018-06-23 ENCOUNTER — Telehealth: Payer: Self-pay | Admitting: Neurology

## 2018-06-23 NOTE — Telephone Encounter (Signed)
Left message requesting a return call.

## 2018-06-23 NOTE — Telephone Encounter (Signed)
Please call patient, MRI brain showed scattered small vessel disease.  There is no acute abnormalities.    IMPRESSION: This MRI of the brain without contrast shows the following: 1.   Scattered T2/flair hyperintense foci in the pons and left greater than right hemispheres.  This is nonspecific but most likely represents chronic microvascular ischemic change.  None of the foci appear to be acute. 2.   Small amount of fluid in the sphenoid sinus could represent minimal acute sinusitis. 3.   There are no acute findings.

## 2018-06-24 NOTE — Telephone Encounter (Signed)
Pt has called RN Sharyn Lull back, she is asking for a call back

## 2018-06-24 NOTE — Telephone Encounter (Signed)
Spoke to patient and notified her of the MRI results below.  She verbalized understanding.

## 2018-07-09 ENCOUNTER — Other Ambulatory Visit: Payer: Self-pay | Admitting: Family Medicine

## 2018-07-14 ENCOUNTER — Other Ambulatory Visit: Payer: Self-pay | Admitting: Family Medicine

## 2018-07-14 DIAGNOSIS — M545 Low back pain, unspecified: Secondary | ICD-10-CM

## 2018-07-21 ENCOUNTER — Other Ambulatory Visit: Payer: Medicare Other

## 2018-07-31 ENCOUNTER — Ambulatory Visit
Admission: RE | Admit: 2018-07-31 | Discharge: 2018-07-31 | Disposition: A | Discharge status: Home / Self Care | Payer: Medicare Other | Source: Ambulatory Visit | Attending: Family Medicine | Admitting: Family Medicine

## 2018-07-31 DIAGNOSIS — M545 Low back pain, unspecified: Secondary | ICD-10-CM

## 2018-07-31 IMAGING — MR MRI LUMBAR SPINE WITHOUT AND WITH CONTRAST
7 series · 48 of 48 positions shown · IV contrast (multihance)
Comparison: MRI lumbar spine [DATE].

CLINICAL DATA: Severe low back pain with burning sensation in the
groin, lower extremity swelling and left knee pain. Symptoms for
over 10 years, worse over the past year. Remote history of breast
cancer.

EXAM:
MRI LUMBAR SPINE WITHOUT AND WITH CONTRAST
TECHNIQUE: Multiplanar and multiecho pulse sequences of the lumbar spine were
obtained without and with intravenous contrast.
CONTRAST:  12 mL MULTIHANCE GADOBENATE DIMEGLUMINE 529 MG/ML IV SOLN

[Series 3: tirm sag · sagittal · 4.0mm · 0.55mm/px · 3 of 14 slices shown]
[im 1/14]
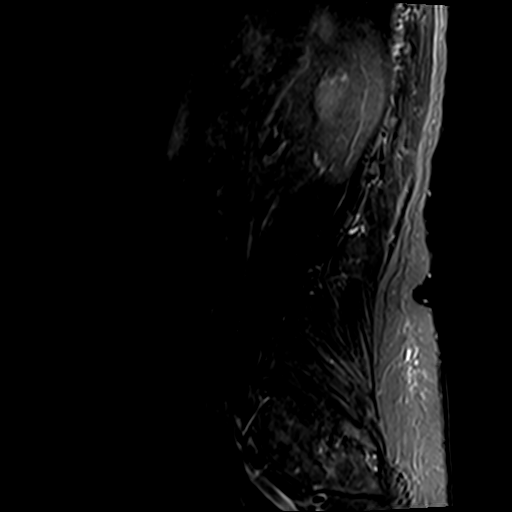
[im 7/14]
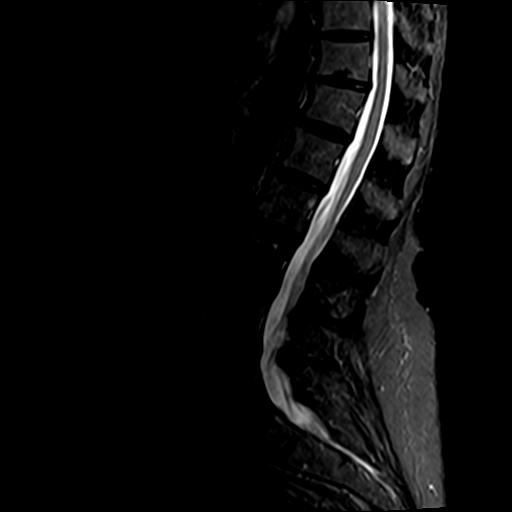
[im 14/14]
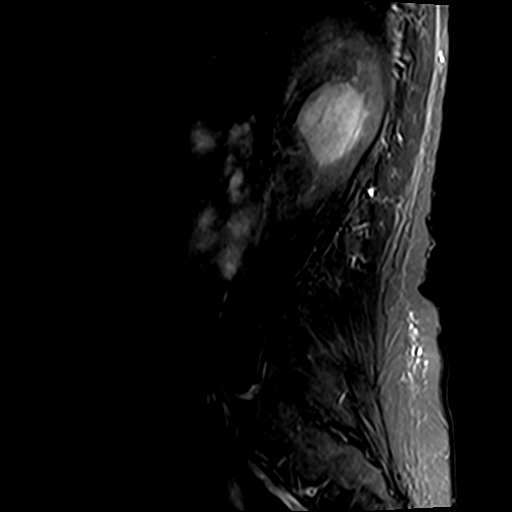

[Series 4: T1 · sagittal · 4.0mm · 0.88mm/px · 4 of 14 slices shown (1 of 2)]
[im 1/14]
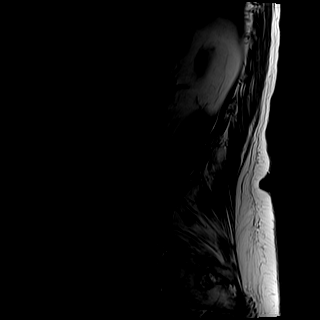
[im 5/14]
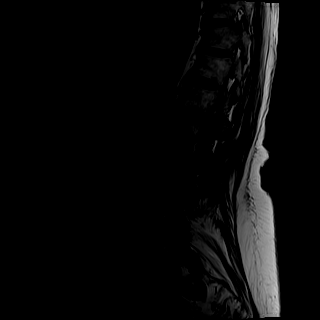
[im 9/14]
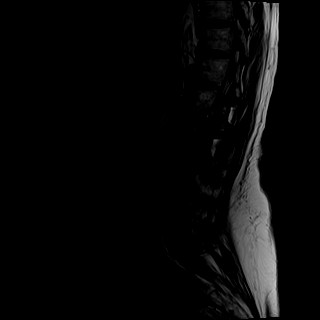
[im 14/14]
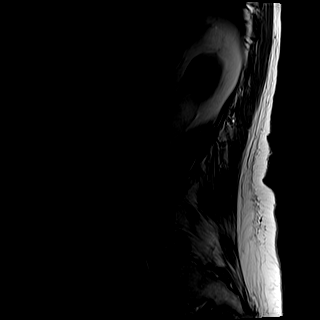

[Series 5: T1 · axial · 4.0mm · 0.78mm/px · z∈[+134,+331]mm · 11 of 36 slices shown (2 of 2)]
[im 1/36]
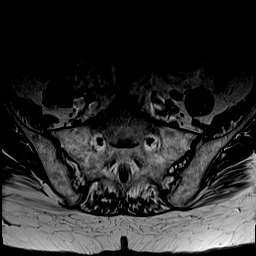
[im 4/36]
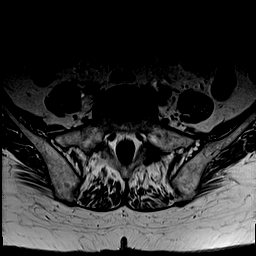
[im 8/36]
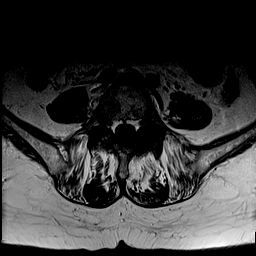
[im 11/36]
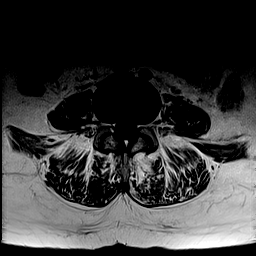
[im 15/36]
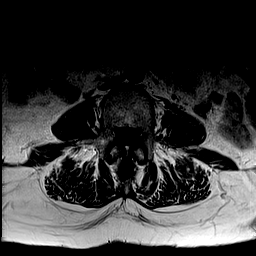
[im 18/36]
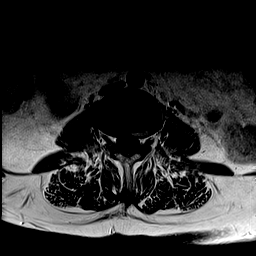
[im 22/36]
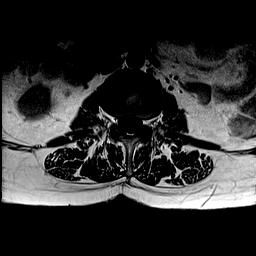
[im 25/36]
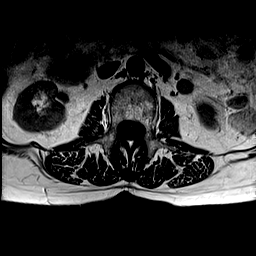
[im 29/36]
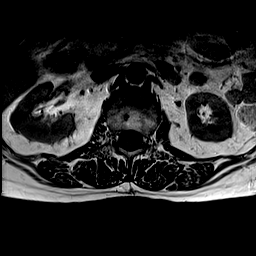
[im 32/36]
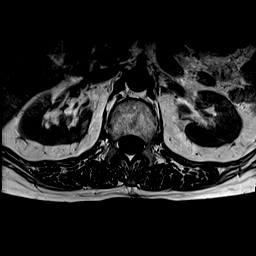
[im 36/36]
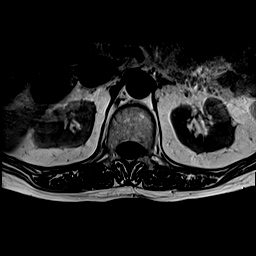

[Series 6: T2 · axial · 4.0mm · 0.78mm/px · z∈[+134,+331]mm · 11 of 36 slices shown]
[im 1/36]
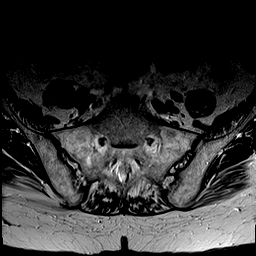
[im 4/36]
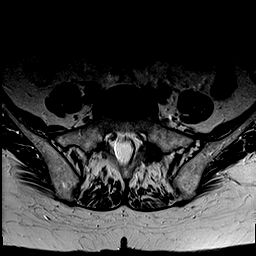
[im 8/36]
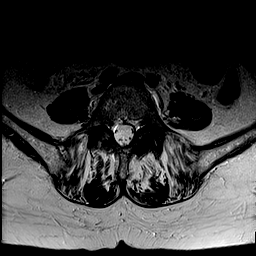
[im 11/36]
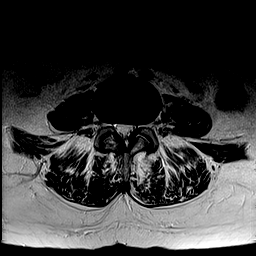
[im 15/36]
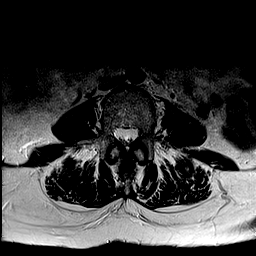
[im 18/36]
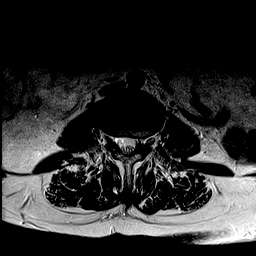
[im 22/36]
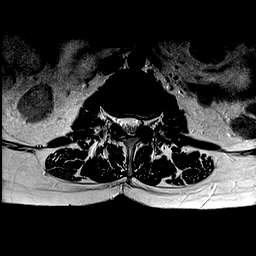
[im 25/36]
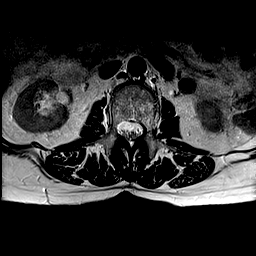
[im 29/36]
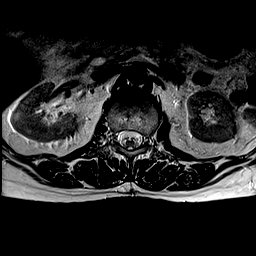
[im 32/36]
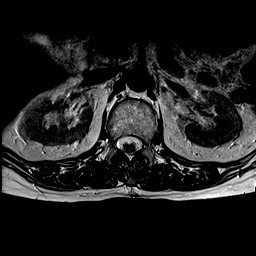
[im 36/36]
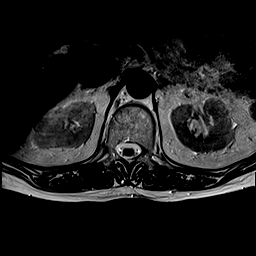

[Series 7: T2 post-contrast · sagittal · 4.0mm · 0.88mm/px · 4 of 14 slices shown]
[im 1/14]
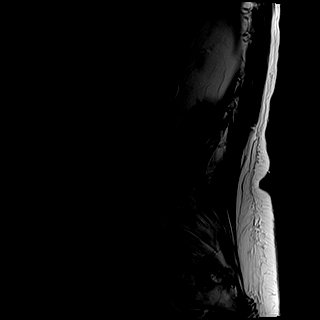
[im 5/14]
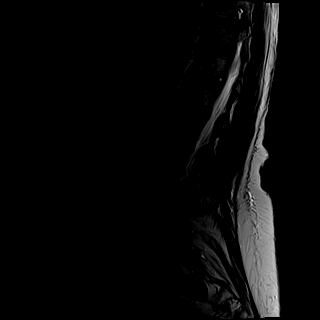
[im 9/14]
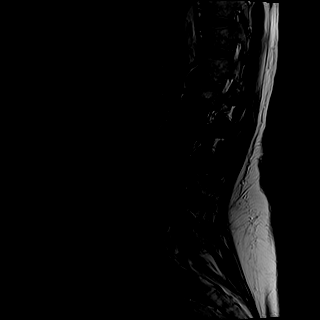
[im 14/14]
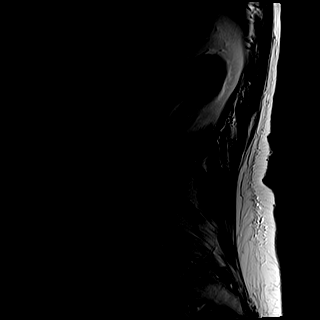

[Series 8: T1 fat-sat post-contrast · sagittal · 4.0mm · 0.88mm/px · 4 of 14 slices shown]
[im 1/14]
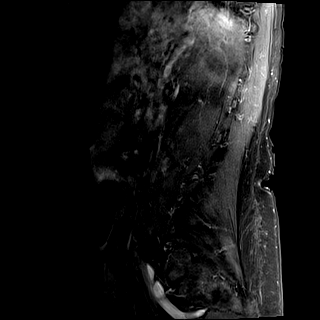
[im 5/14]
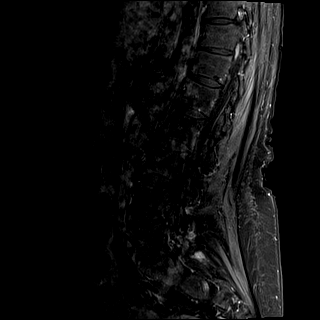
[im 9/14]
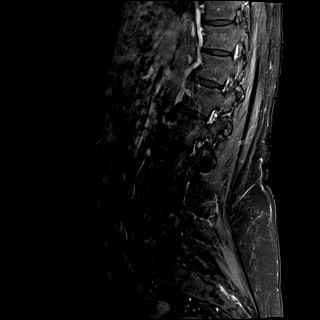
[im 14/14]
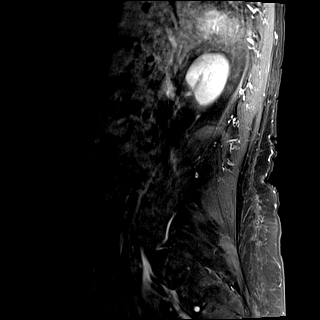

[Series 9: T1 post-contrast · axial · 4.0mm · 0.78mm/px · z∈[+134,+331]mm · 11 of 36 slices shown]
[im 1/36]
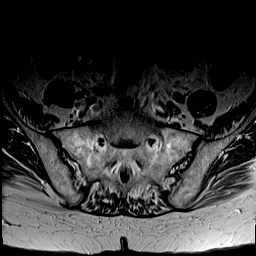
[im 4/36]
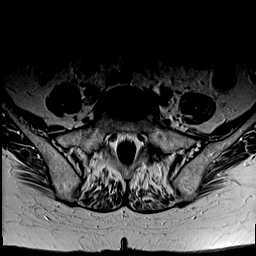
[im 8/36]
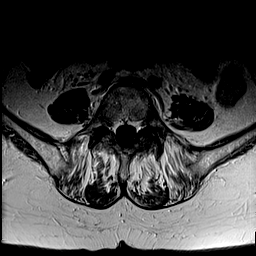
[im 11/36]
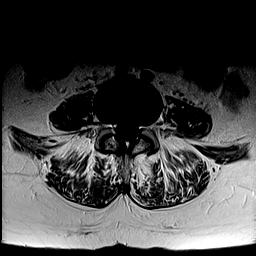
[im 15/36]
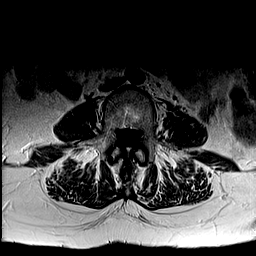
[im 18/36]
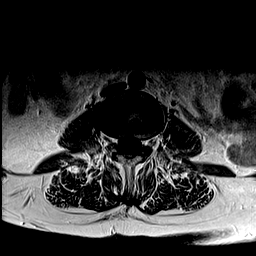
[im 22/36]
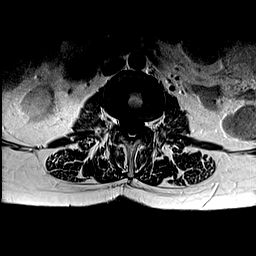
[im 25/36]
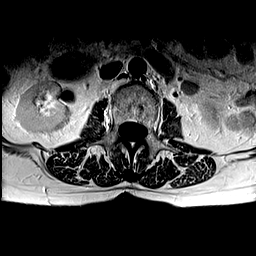
[im 29/36]
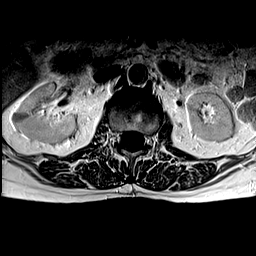
[im 32/36]
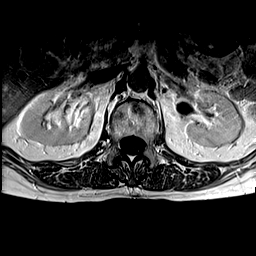
[im 36/36]
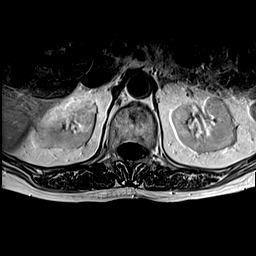

[48 of 48 positions shown; findings below may reference images not displayed]

FINDINGS: Segmentation:  Standard.

Alignment:  Normal.

Vertebrae:  No fracture or worrisome lesion.

Conus medullaris and cauda equina: Conus extends to the L1 level.
Conus and cauda equina appear normal.

Paraspinal and other soft tissues: Small right renal cyst
incidentally noted.

Disc levels:

T10-11 and T11-12 are imaged in the sagittal plane only. The central
canal and foramina are open at both levels with a minimal bulge at
T11-12 noted.

T12-L1: Negative.

L1-2: Negative.

L2-3: Negative.

L3-4: Mild facet degenerative disease. No disc bulge or protrusion.
No stenosis.

L4-5: Shallow disc bulge and mild ligamentum flavum thickening. No
stenosis.

L5-S1: Mild-to-moderate facet arthropathy is more notable on the
left. Minimal disc bulge. No stenosis.
IMPRESSION: No new abnormality since the prior MRI. Negative for central canal
or foraminal narrowing. Facet degenerative disease lower lumbar
spine is most notable on the left at L5-S1.

## 2018-07-31 MED ORDER — GADOBENATE DIMEGLUMINE 529 MG/ML IV SOLN
12.0000 mL | Freq: Once | INTRAVENOUS | Status: AC | PRN
  Administered 2018-07-31: 15:00:00 12 mL via INTRAVENOUS

## 2018-08-26 ENCOUNTER — Telehealth: Payer: Self-pay

## 2018-08-26 NOTE — Telephone Encounter (Signed)
NOTES ON FILE FROM [facility] [phone] REFERRAL TO SCHEDULING

## 2018-08-27 ENCOUNTER — Other Ambulatory Visit (HOSPITAL_COMMUNITY): Payer: Self-pay | Admitting: Family Medicine

## 2018-08-27 DIAGNOSIS — R6889 Other general symptoms and signs: Secondary | ICD-10-CM

## 2018-08-29 ENCOUNTER — Other Ambulatory Visit: Payer: Self-pay | Admitting: Physician Assistant

## 2018-08-29 DIAGNOSIS — R1084 Generalized abdominal pain: Secondary | ICD-10-CM

## 2018-08-29 DIAGNOSIS — R102 Pelvic and perineal pain: Secondary | ICD-10-CM

## 2018-08-29 DIAGNOSIS — R109 Unspecified abdominal pain: Secondary | ICD-10-CM

## 2018-09-02 ENCOUNTER — Telehealth (HOSPITAL_COMMUNITY): Payer: Self-pay | Admitting: Radiology

## 2018-09-02 NOTE — Telephone Encounter (Signed)
Left message to call office-Patient needs to schedule an echocardiogram.  

## 2018-09-11 ENCOUNTER — Ambulatory Visit
Admission: RE | Admit: 2018-09-11 | Discharge: 2018-09-11 | Disposition: A | Discharge status: Home / Self Care | Payer: Medicare Other | Source: Ambulatory Visit | Attending: Physician Assistant | Admitting: Physician Assistant

## 2018-09-11 DIAGNOSIS — R109 Unspecified abdominal pain: Secondary | ICD-10-CM

## 2018-09-11 DIAGNOSIS — R1084 Generalized abdominal pain: Secondary | ICD-10-CM

## 2018-09-11 DIAGNOSIS — R102 Pelvic and perineal pain: Secondary | ICD-10-CM

## 2018-09-11 IMAGING — CT CT ABDOMEN AND PELVIS WITHOUT CONTRAST
2 of 4 series · 13 of 46 positions shown, 15 images · non-contrast
Comparison: CT, [DATE]

CLINICAL DATA: Unexplained perianal and perivaginal burning. Pelvic
pain with history of bowel obstructions. Previous hysterectomy.
History of right breast carcinoma.

EXAM:
CT ABDOMEN AND PELVIS WITHOUT CONTRAST
TECHNIQUE: Multidetector CT imaging of the abdomen and pelvis was performed
following the standard protocol without IV contrast.

[Series 2: routine abdomen pelvis without 5.00 br40 s3 axial · axial · non-contrast · 0.53mm/px · z∈[+1079,+1429]mm · 10 of 84 slices shown, 12 images]
[im 7/84  soft-tissue]
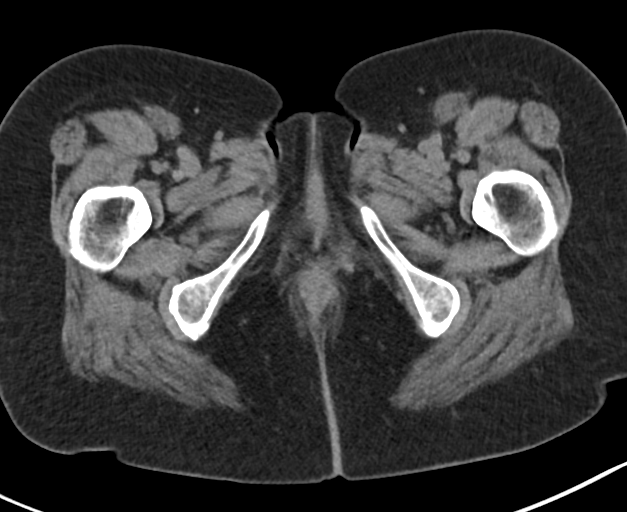
[im 7/84  bone]
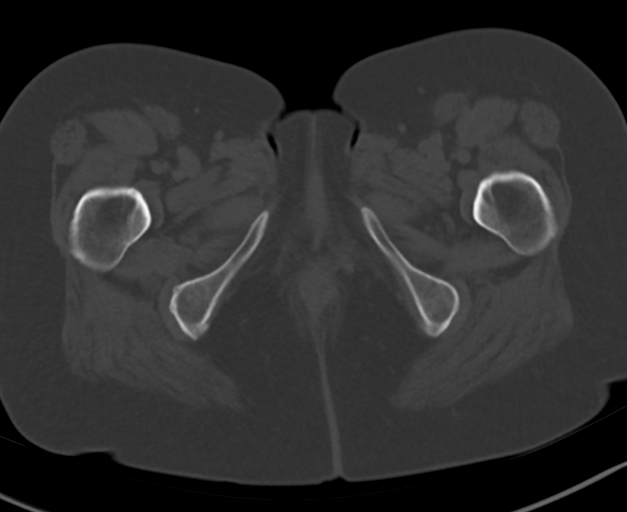
[im 14/84  soft-tissue]
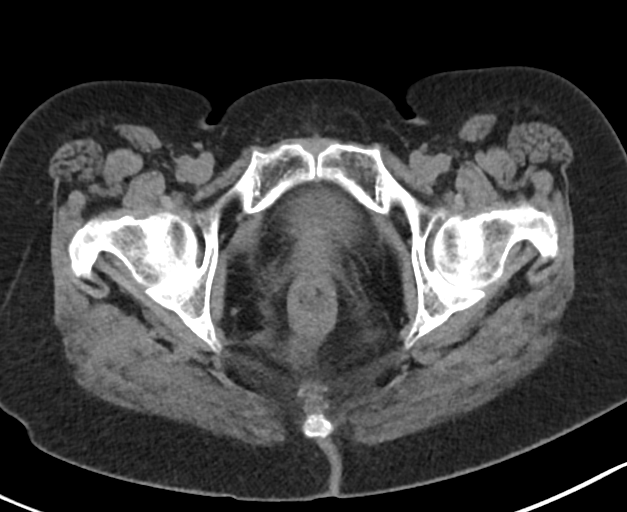
[im 21/84  soft-tissue]
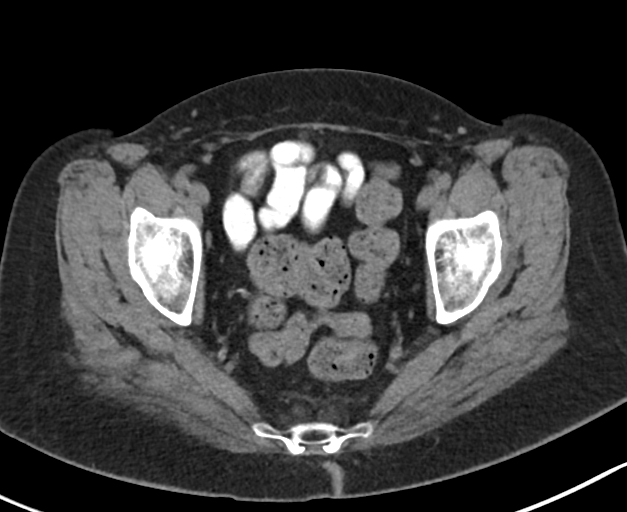
[im 32/84  soft-tissue]
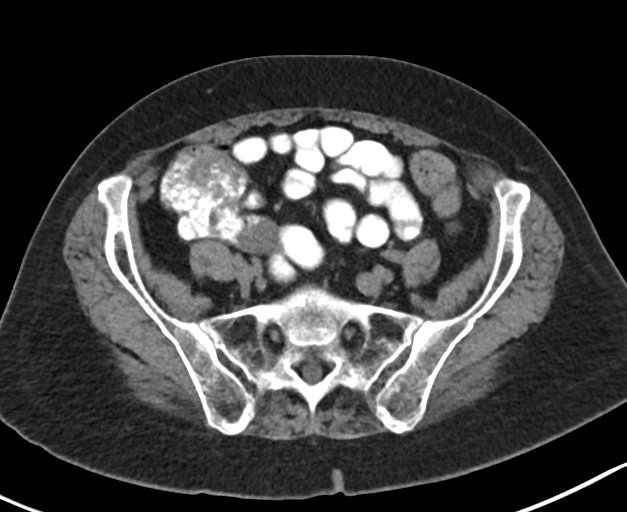
[im 39/84  soft-tissue]
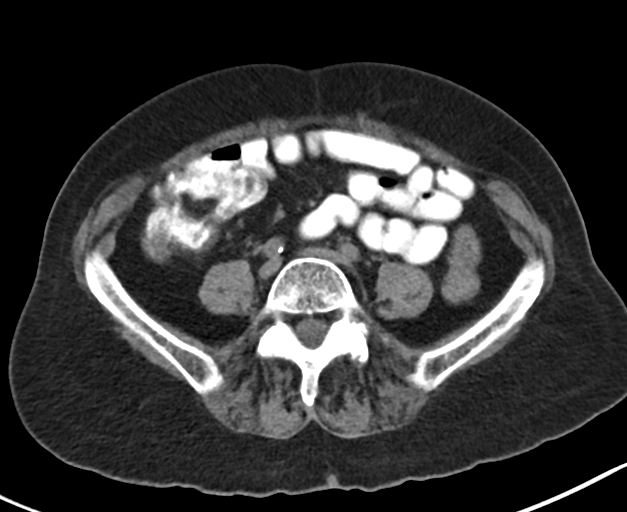
[im 45/84  soft-tissue]
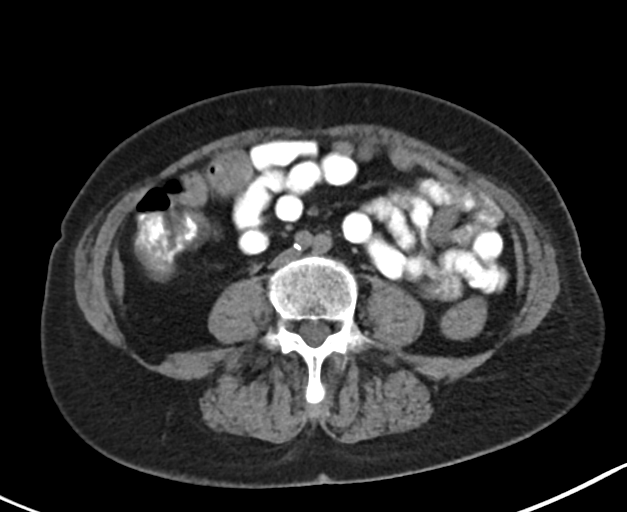
[im 52/84  soft-tissue]
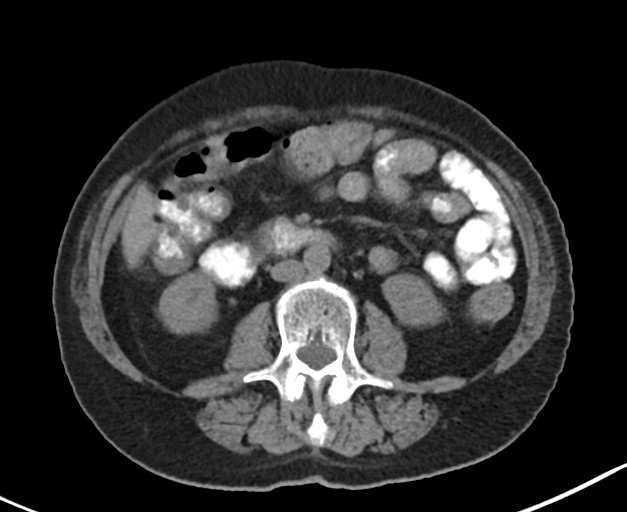
[im 63/84  soft-tissue]
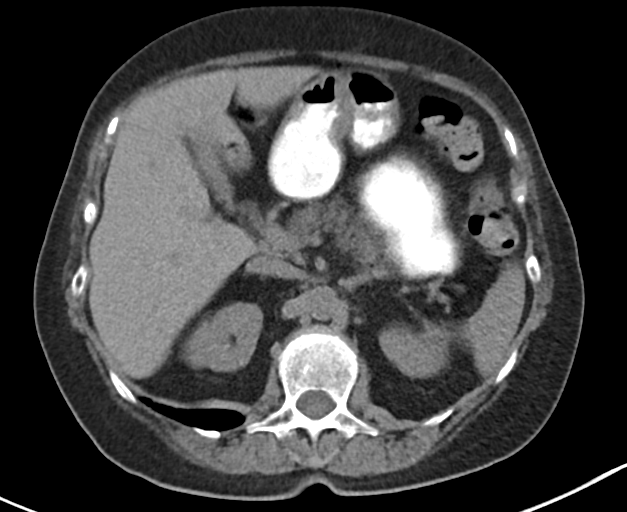
[im 70/84  soft-tissue]
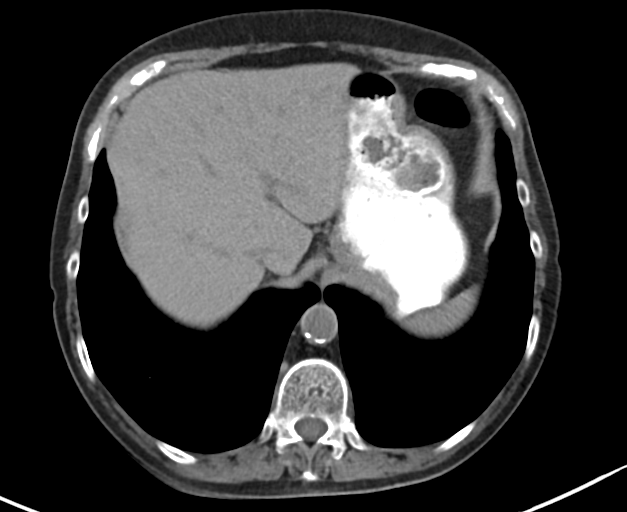
[im 70/84  bone]
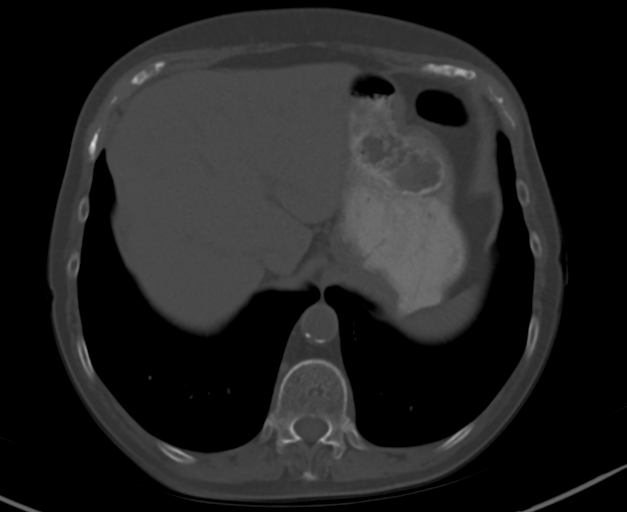
[im 77/84  soft-tissue]
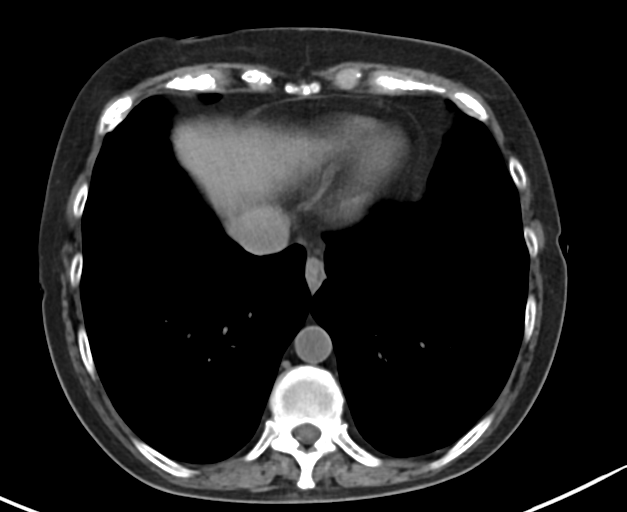

[Series 4: routine abdomen pelvis without 2.00 br40 s3 cor · coronal · non-contrast · 0.65mm/px · 3 of 135 slices shown]
[im 45/135  soft-tissue]
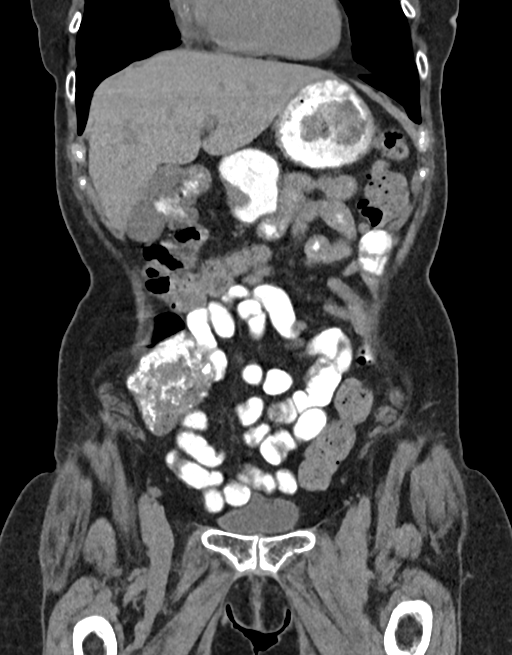
[im 60/135  soft-tissue]
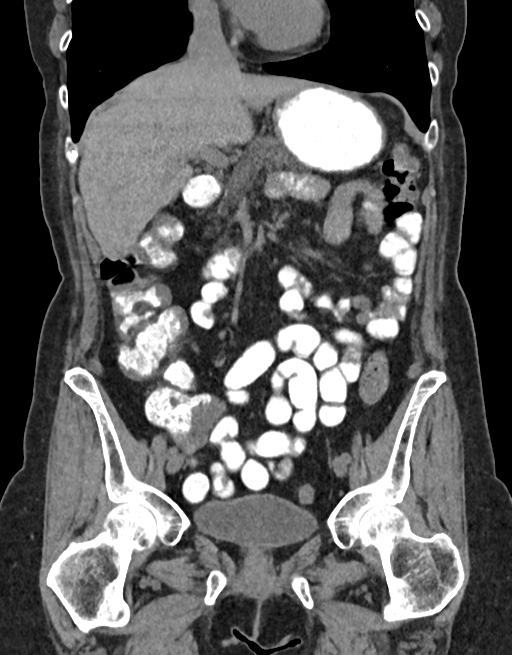
[im 75/135  soft-tissue]
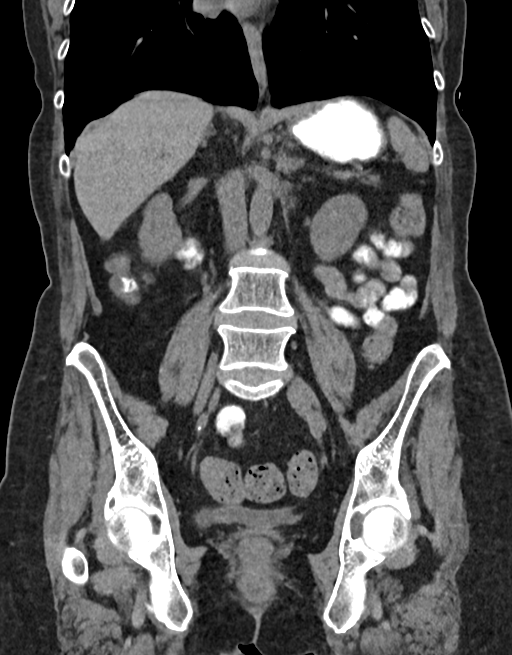

[13 of 46 positions shown; findings below may reference images not displayed]

FINDINGS: Lower chest: No acute abnormality.

Hepatobiliary: No focal liver abnormality is seen. No gallstones,
gallbladder wall thickening, or biliary dilatation.

Pancreas: Unremarkable. No pancreatic ductal dilatation or
surrounding inflammatory changes.

Spleen: Choose 1

Adrenals/Urinary Tract: No adrenal masses.

Kidneys normal in overall size, orientation and position. No renal
masses, stones or hydronephrosis. Normal ureters. Normal bladder.

Stomach/Bowel: Normal stomach. Small bowel and colon are normal in
caliber. No wall thickening. No inflammation. Appendix not
visualized. No evidence of appendicitis.

Vascular/Lymphatic: Mild aortic atherosclerotic calcifications. No
aneurysm. No enlarged lymph nodes.

Reproductive: Status post hysterectomy. No adnexal masses.

Other: No abnormality noted along the perineum or involving the
region of the anus vagina.

No abdominal wall hernia.  No abdominopelvic ascites.

Musculoskeletal: No fracture or acute finding. No osteoblastic or
osteolytic lesions.
IMPRESSION: 1. No acute findings. No findings to account for the patient's
abdominal pain or perineal region pain.
2. Mild aortic atherosclerosis.
3. Status post hysterectomy.

## 2018-09-14 ENCOUNTER — Other Ambulatory Visit (HOSPITAL_COMMUNITY): Payer: Medicare Other

## 2018-09-16 ENCOUNTER — Ambulatory Visit (INDEPENDENT_AMBULATORY_CARE_PROVIDER_SITE_OTHER): Payer: Medicare Other | Admitting: Internal Medicine

## 2018-09-16 ENCOUNTER — Other Ambulatory Visit: Payer: Self-pay

## 2018-09-16 ENCOUNTER — Encounter: Payer: Self-pay | Admitting: Internal Medicine

## 2018-09-16 VITALS — BP 110/70 | HR 90 | Temp 98.1°F | Ht 60.0 in | Wt 123.8 lb

## 2018-09-16 DIAGNOSIS — R42 Dizziness and giddiness: Secondary | ICD-10-CM

## 2018-09-16 DIAGNOSIS — E559 Vitamin D deficiency, unspecified: Secondary | ICD-10-CM | POA: Diagnosis not present

## 2018-09-16 DIAGNOSIS — E538 Deficiency of other specified B group vitamins: Secondary | ICD-10-CM | POA: Diagnosis not present

## 2018-09-16 DIAGNOSIS — M79604 Pain in right leg: Secondary | ICD-10-CM | POA: Diagnosis not present

## 2018-09-16 DIAGNOSIS — M79605 Pain in left leg: Secondary | ICD-10-CM

## 2018-09-16 DIAGNOSIS — F339 Major depressive disorder, recurrent, unspecified: Secondary | ICD-10-CM | POA: Diagnosis not present

## 2018-09-16 DIAGNOSIS — R5383 Other fatigue: Secondary | ICD-10-CM

## 2018-09-16 LAB — CBC WITH DIFFERENTIAL/PLATELET
Basophils Absolute: 0 10*3/uL (ref 0.0–0.1)
Basophils Relative: 0.7 % (ref 0.0–3.0)
Eosinophils Absolute: 0.1 10*3/uL (ref 0.0–0.7)
Eosinophils Relative: 0.9 % (ref 0.0–5.0)
HCT: 37.8 % (ref 36.0–46.0)
Hemoglobin: 12.4 g/dL (ref 12.0–15.0)
Lymphocytes Relative: 27.9 % (ref 12.0–46.0)
Lymphs Abs: 1.9 10*3/uL (ref 0.7–4.0)
MCHC: 32.9 g/dL (ref 30.0–36.0)
MCV: 102.5 fl — ABNORMAL HIGH (ref 78.0–100.0)
Monocytes Absolute: 0.6 10*3/uL (ref 0.1–1.0)
Monocytes Relative: 8.7 % (ref 3.0–12.0)
Neutro Abs: 4.3 10*3/uL (ref 1.4–7.7)
Neutrophils Relative %: 61.8 % (ref 43.0–77.0)
Platelets: 307 10*3/uL (ref 150.0–400.0)
RBC: 3.69 Mil/uL — ABNORMAL LOW (ref 3.87–5.11)
RDW: 13.5 % (ref 11.5–15.5)
WBC: 7 10*3/uL (ref 4.0–10.5)

## 2018-09-16 LAB — COMPREHENSIVE METABOLIC PANEL
ALT: 11 U/L (ref 0–35)
AST: 13 U/L (ref 0–37)
Albumin: 4.4 g/dL (ref 3.5–5.2)
Alkaline Phosphatase: 76 U/L (ref 39–117)
BUN: 14 mg/dL (ref 6–23)
CO2: 25 mEq/L (ref 19–32)
Calcium: 9.6 mg/dL (ref 8.4–10.5)
Chloride: 105 mEq/L (ref 96–112)
Creatinine, Ser: 0.8 mg/dL (ref 0.40–1.20)
GFR: 70.1 mL/min (ref >60.00)
Glucose, Bld: 104 mg/dL — ABNORMAL HIGH (ref 70–99)
Potassium: 4.7 mEq/L (ref 3.5–5.1)
Sodium: 137 mEq/L (ref 135–145)
Total Bilirubin: 0.3 mg/dL (ref 0.2–1.2)
Total Protein: 7.3 g/dL (ref 6.0–8.3)

## 2018-09-16 LAB — VITAMIN B12: Vitamin B-12: 413 pg/mL (ref 211–911)

## 2018-09-16 LAB — VITAMIN D 25 HYDROXY (VIT D DEFICIENCY, FRACTURES): VITD: 78.31 ng/mL (ref 30.00–100.00)

## 2018-09-16 LAB — TSH: TSH: 3.49 u[IU]/mL (ref 0.35–4.50)

## 2018-09-16 NOTE — Patient Instructions (Signed)
-  Nice meeting you today!!  -Lab work today; will notify you as soon as results are available.  -Schedule counseling with Dennison Bulla.  -Schedule follow up with me in 3 months.

## 2018-09-16 NOTE — Progress Notes (Signed)
Established Patient Office Visit     CC/Reason for Visit: Establish care, follow-up chronic conditions, discuss some acute issues  HPI: Jill Salinas is a 73 y.o. female who is coming in today for the above mentioned reasons. Past Medical History is significant for: Severe depression, chronic pain syndrome on Percocet prescribed via pain clinic, hyperlipidemia, history of migraines.  She has a slew of complaints today centered around severe fatigue, having no energy, feeling off balance when she walks to the point where she feels like she will fall down, she feels dizzy at times, her mood has been extremely depressed, she states it is embarrassing how dirty her house is because she does not have the energy to clean, her legs and groin area have been "burning" also feels like pins-and-needles, her legs are severely hurting.  She is not actively suicidal but states: "It is probably just better to let me go because I am no use the related I am".  She has never had CBT sessions or seen a psychiatrist.  Past Medical/Surgical History: Past Medical History:  Diagnosis Date  . Bowel obstruction ([place])   . Breast cancer ([place])   . Headache(784.0)   . Migraine   . Vision abnormalities    glaucoma    Past Surgical History:  Procedure Laterality Date  . BREAST LUMPECTOMY    . TOTAL ABDOMINAL HYSTERECTOMY N/A     Social History:  reports that she has never smoked. She has never used smokeless tobacco. She reports current alcohol use. She reports that she does not use drugs.  Allergies: Allergies  Allergen Reactions  . Iodinated Diagnostic Agents Other (See Comments)    Tthroat itching Tthroat itching, pt received a 13 hour prep for injection  . Codeine   . Depakote [Divalproex Sodium] Nausea And Vomiting  . Gabapentin Other (See Comments)    Made her "feel crazy"  . Nortriptyline Other (See Comments)    Reported swelling and incomplete emptying of bladder.    Family History:   Family History  Problem Relation Age of Onset  . Heart disease Mother   . Headache Mother   . Heart attack Father      Current Outpatient Medications:  .  denosumab (PROLIA) 60 MG/ML SOLN injection, Inject 60 mg into the skin every 6 (six) months. Administer in upper arm, thigh, or abdomen, Disp: , Rfl:  .  DULoxetine (CYMBALTA) 60 MG capsule, 60 mg daily. , Disp: , Rfl:  .  FLUoxetine (PROZAC) 40 MG capsule, Take 40 mg by mouth daily., Disp: , Rfl:  .  LUMIGAN 0.01 % SOLN, Place 1 drop into both eyes at bedtime., Disp: , Rfl:  .  magnesium hydroxide (MILK OF MAGNESIA) 400 MG/5ML suspension, Take by mouth daily as needed for mild constipation., Disp: , Rfl:  .  Misc Natural Products (LEG VEIN & CIRCULATION PO), Take by mouth., Disp: , Rfl:  .  modafinil (PROVIGIL) 200 MG tablet, Take 200 mg by mouth daily. , Disp: , Rfl: 1 .  Multiple Vitamins-Minerals (ONE-A-DAY VITACRAVES IMMUNITY PO), Take by mouth., Disp: , Rfl:  .  oxyCODONE-acetaminophen (PERCOCET) 10-325 MG tablet, Filled by pain clinic, Disp: , Rfl: 0 .  polyethylene glycol (MIRALAX / GLYCOLAX) packet, Take 17 g by mouth daily., Disp: , Rfl:  .  topiramate (TOPAMAX) 100 MG tablet, Take two tablets at bedtime., Disp: 60 tablet, Rfl: 11 .  tretinoin (RETIN-A) 0.025 % cream, Apply 1 application topically at bedtime. , Disp: ,  Rfl:  .  Vitamin D, Ergocalciferol, (DRISDOL) 50000 units CAPS capsule, Take 50,000 Units by mouth every 7 (seven) days., Disp: , Rfl:  .  zolpidem (AMBIEN) 5 MG tablet, Take 5 mg by mouth at bedtime as needed for sleep., Disp: , Rfl:   Review of Systems:  Constitutional: Denies fever, chills, diaphoresis, appetite change. HEENT: Denies photophobia, eye pain, redness, hearing loss, ear pain, congestion, sore throat, rhinorrhea, sneezing, mouth sores, trouble swallowing, neck pain, neck stiffness and tinnitus.   Respiratory: Denies SOB, DOE, cough, chest tightness,  and wheezing.   Cardiovascular: Denies chest  pain, palpitations and leg swelling.  Gastrointestinal: Denies nausea, vomiting, abdominal pain, diarrhea, constipation, blood in stool and abdominal distention.  Genitourinary: Denies dysuria, urgency, frequency, hematuria, flank pain and difficulty urinating.  Endocrine: Denies: hot or cold intolerance, sweats, changes in hair or nails, polyuria, polydipsia. Musculoskeletal: Denies  joint swelling, arthralgias. Skin: Denies pallor, rash and wound.  Neurological: Denies  seizures, syncope. Hematological: Denies adenopathy. Easy bruising, personal or family bleeding history  Psychiatric/Behavioral: Denies suicidal ideation, confusion, nervousness and agitation    Physical Exam: Vitals:   09/16/18 1434  BP: 110/70  Pulse: 90  Temp: 98.1 F (36.7 C)  TempSrc: Oral  SpO2: 96%  Weight: 123 lb 12.8 oz (56.2 kg)  Height: 5' (1.524 m)    Body mass index is 24.18 kg/m.   Constitutional: NAD, calm, comfortable Eyes: PERRL, lids and conjunctivae normal ENMT: Mucous membranes are moist.  Respiratory: clear to auscultation bilaterally, no wheezing, no crackles. Normal respiratory effort. No accessory muscle use.  Cardiovascular: Regular rate and rhythm, no murmurs / rubs / gallops. No extremity edema. 2+ pedal pulses. No carotid bruits.  Abdomen: no tenderness, no masses palpated. No hepatosplenomegaly. Bowel sounds positive.  Musculoskeletal: no clubbing / cyanosis. No joint deformity upper and lower extremities. Good ROM, no contractures. Normal muscle tone.  Skin: no rashes, lesions, ulcers. No induration Neurologic: Grossly intact and nonfocal Psychiatric: Normal judgment and insight. Alert and oriented x 3.  Appears tearful and depressed    Office Visit from 09/16/2018 in [employer] at [place]  PHQ-9 Total Score  13       Impression and Plan:  Depression, recurrent ([place]) -As she is on multiple mood stabilizers, believe it is prudent to refer to psychiatry.  She has  had a high score on PHQ 9 today of 13.  She appears very depressed, tearful mood, no SI. -Will also ask her to arrange for CBT. -I wonder if hypothyroidism/B12 might be playing a role, see below.  Dizziness Fatigue, unspecified type Bilateral leg pain  -Would like to first rule out hypothyroidism, B12 deficiency, vitamin D deficiency. -Further work-up pending results.   Patient Instructions  -Nice meeting you today!!  -Lab work today; will notify you as soon as results are available.  -Schedule counseling with Dennison Bulla.  -Schedule follow up with me in 3 months.     Lelon Frohlich, MD [facility]

## 2018-09-17 ENCOUNTER — Other Ambulatory Visit: Payer: Self-pay | Admitting: Internal Medicine

## 2018-09-17 DIAGNOSIS — F339 Major depressive disorder, recurrent, unspecified: Secondary | ICD-10-CM

## 2018-09-17 DIAGNOSIS — G629 Polyneuropathy, unspecified: Secondary | ICD-10-CM

## 2018-09-17 MED ORDER — FOLIC ACID 1 MG PO TABS: 1.0000 mg | ORAL_TABLET | Freq: Every day | ORAL | 3 refills | Status: DC

## 2018-09-17 MED ORDER — VITAMIN B-1 100 MG PO TABS: 100.0000 mg | ORAL_TABLET | Freq: Every day | ORAL | 3 refills | Status: DC

## 2018-09-21 ENCOUNTER — Telehealth: Payer: Self-pay | Admitting: Neurology

## 2018-09-21 NOTE — Telephone Encounter (Signed)
Called pt to get a little more information. LVM asking she call office back.

## 2018-09-21 NOTE — Telephone Encounter (Signed)
Pt called stating that she is wanting to switch to something else than the topiramate (TOPAMAX) 100 MG tablet that she is taking for her headaches. Please advise.

## 2018-09-22 NOTE — Telephone Encounter (Signed)
Pt has called RN Terrence Dupont back, she is asking for a call back

## 2018-09-22 NOTE — Telephone Encounter (Addendum)
Spoke to patient - states it is time for her three month follow up and she would like to discuss alternate treatments options other than topiramate.  She wonders if the topiramate is contributing to her discomfort in her legs. Her legs have been an issue for some time now and the two may not be related. However, she has read an article that caused her to question the medication. Topamax has worked well to control her migraines.  She scheduled an appt with Butler Denmark, NP on 10/14/2018 at 12:45pm.  She will arrive 30 minutes early for check-in.

## 2018-10-05 ENCOUNTER — Telehealth: Payer: Self-pay | Admitting: Internal Medicine

## 2018-10-05 ENCOUNTER — Ambulatory Visit: Payer: Self-pay

## 2018-10-05 NOTE — Telephone Encounter (Signed)
Copied from [place]. Topic: Quick Communication - Rx Refill/Question >> Oct 05, 2018 11:43 AM Rainey Pines A wrote: Medication: folic acid, vitamin B1 (Patient feels that she may be having a reaction to the following medication and would like a callback.)  Has the patient contacted their pharmacy? Yes (Agent: If no, request that the patient contact the pharmacy for the refill.) (Agent: If yes, when and what did the pharmacy advise?)Contact PCP  Preferred Pharmacy (with phone number or street name): [id number] - [place], [address]. [address] [phone] (Phone) [phone] (Fax)    Agent: Please be advised that RX refills may take up to 3 business days. We ask that you follow-up with your pharmacy.

## 2018-10-05 NOTE — Telephone Encounter (Signed)
Out going call to Patient who complains of itching moderately in her scalp since starting Folic acid and vitamin  b1.Patient has not taken a doses today.  Waiting to hear from PCP.  Patient states that she awaits to hear  From Dr.  Jerilee Hoh with what to do.     Answer Assessment - Initial Assessment Questions 1.   NAME of MEDICATION: "What medicine are you calling about?"    Folic acid , vitamin B 2.   QUESTION: "What is your question?"     *No Answer* 3.   PRESCRIBING HCP: "Who prescribed it?" Reason: if prescribed by specialist, call should be referred to that group.     Dr.  Danae Orleans 4. SYMPTOMS: "Do you have any symptoms?"  itching 5. SEVERITY: If symptoms are present, ask "Are they mild, moderate or severe?"     moderaet to sever  6.  PREGNANCY:  "Is there any chance that you are pregnant?" "When was your last menstrual period?"    na  Answer Assessment - Initial Assessment Questions 1. SUBSTANCE: "What was swallowed?" If necessary, have the caller look at the label on the container.        2. AMOUNT: "How much was swallowed?" (Err on the side of recording the maximal amount that is missing)      *No Answer* 3. ONSET: "When was it probably swallowed?" (Minutes or hours ago)      *No Answer* 4. SYMPTOMS: "Do you have any symptoms?" If so, ask: "What are they?" (e.g., abdominal pain, vomiting, weakness)      *No Answer* 5. SUICIDAL: "Did you take this to hurt or kill yourself?"     *No Answer* 6. PREGNANCY: "Is there any chance you are pregnant?" "When was your last menstrual period?"     *No Answer*  Protocols used: MEDICATION QUESTION CALL-A-AH, POISONING-A-AH

## 2018-10-06 NOTE — Telephone Encounter (Signed)
Patient is aware 

## 2018-10-06 NOTE — Telephone Encounter (Signed)
Doubt these are causing itching. Should continue.

## 2018-10-08 ENCOUNTER — Other Ambulatory Visit: Payer: Self-pay | Admitting: *Deleted

## 2018-10-08 MED ORDER — SUMATRIPTAN SUCCINATE 100 MG PO TABS: ORAL_TABLET | ORAL | 1 refills | Status: DC

## 2018-10-08 NOTE — Telephone Encounter (Signed)
Received fax request for sumatriptan oral tabs.  These are not on her medication list.  She had been on previously as well as injection.   She has appt with Judson Roch in 10/2018.  Ok to refill?

## 2018-10-12 ENCOUNTER — Telehealth: Payer: Self-pay | Admitting: Internal Medicine

## 2018-10-12 NOTE — Telephone Encounter (Signed)
Copied from [place] [phone]. Topic: General - Other >> Oct 12, 2018  2:32 PM Rainey Pines A wrote: Patient called to inform Dr. Jerilee Hoh that she has seen Dr. Darron Doom before and wants her to know that Dr. Darron Doom has xrays on file for patient that she would like to be requested and sent to office. The xrays are of her back and stomach. Patient would like a callback from Dr. Jerilee Hoh nurse.

## 2018-10-13 NOTE — Telephone Encounter (Signed)
Spoke with patient and a ROI form mailed to home address.

## 2018-10-14 ENCOUNTER — Ambulatory Visit: Payer: Self-pay | Admitting: Neurology

## 2018-10-26 ENCOUNTER — Encounter (HOSPITAL_COMMUNITY): Payer: Self-pay | Admitting: Family Medicine

## 2018-10-30 ENCOUNTER — Telehealth (INDEPENDENT_AMBULATORY_CARE_PROVIDER_SITE_OTHER): Payer: Medicare Other | Admitting: Internal Medicine

## 2018-10-30 ENCOUNTER — Telehealth (HOSPITAL_COMMUNITY): Payer: Self-pay

## 2018-10-30 ENCOUNTER — Other Ambulatory Visit: Payer: Self-pay

## 2018-10-30 DIAGNOSIS — L309 Dermatitis, unspecified: Secondary | ICD-10-CM

## 2018-10-30 MED ORDER — TRIAMCINOLONE ACETONIDE 0.1 % EX CREA: TOPICAL_CREAM | CUTANEOUS | 0 refills | Status: DC

## 2018-10-30 NOTE — Telephone Encounter (Signed)
New message    Just an FYI. We have made several attempts to contact this patient including sending a letter to schedule or reschedule their echocardiogram. We will be removing the patient from the echo WQ.   9.14.20 mail reminder letter Jill Salinas   8.3.20 no show  09/02/18 lmom evd

## 2018-10-30 NOTE — Progress Notes (Signed)
Virtual Visit via Video Note  I connected with Patsy Lager on 10/30/18 at  3:00 PM EDT by a video enabled telemedicine application and verified that I am speaking with the correct person using two identifiers.  Location patient: home Location provider: work office Persons participating in the virtual visit: patient, provider  I discussed the limitations of evaluation and management by telemedicine and the availability of in person appointments. The patient expressed understanding and agreed to proceed.   HPI: She has scheduled this acute visit to discuss a rash on her face.  About 5 days ago, while in the shower she inadvertently used her husband's exfoliation brush instead of hers on her face which has a rougher texture.  She immediately felt a burning of her right cheek and chin.  She has been using over-the-counter hydrocortisone cream with some improvement but it still is quite red.   ROS: Constitutional: Denies fever, chills, diaphoresis, appetite change and fatigue.  HEENT: Denies photophobia, eye pain, redness, hearing loss, ear pain, congestion, sore throat, rhinorrhea, sneezing, mouth sores, trouble swallowing, neck pain, neck stiffness and tinnitus.   Respiratory: Denies SOB, DOE, cough, chest tightness,  and wheezing.   Cardiovascular: Denies chest pain, palpitations and leg swelling.  Gastrointestinal: Denies nausea, vomiting, abdominal pain, diarrhea, constipation, blood in stool and abdominal distention.  Genitourinary: Denies dysuria, urgency, frequency, hematuria, flank pain and difficulty urinating.  Endocrine: Denies: hot or cold intolerance, sweats, changes in hair or nails, polyuria, polydipsia. Musculoskeletal: Denies myalgias, back pain, joint swelling, arthralgias and gait problem.  Skin: Denies pallor, rash and wound.  Neurological: Denies dizziness, seizures, syncope, weakness, light-headedness, numbness and headaches.  Hematological: Denies adenopathy. Easy  bruising, personal or family bleeding history  Psychiatric/Behavioral: Denies suicidal ideation, mood changes, confusion, nervousness, sleep disturbance and agitation   Past Medical History:  Diagnosis Date  . Bowel obstruction ([place])   . Breast cancer ([place])   . Headache(784.0)   . Migraine   . Vision abnormalities    glaucoma    Past Surgical History:  Procedure Laterality Date  . BREAST LUMPECTOMY    . TOTAL ABDOMINAL HYSTERECTOMY N/A     Family History  Problem Relation Age of Onset  . Heart disease Mother   . Headache Mother   . Heart attack Father     SOCIAL HX:   reports that she has never smoked. She has never used smokeless tobacco. She reports current alcohol use. She reports that she does not use drugs.   Current Outpatient Medications:  .  denosumab (PROLIA) 60 MG/ML SOLN injection, Inject 60 mg into the skin every 6 (six) months. Administer in upper arm, thigh, or abdomen, Disp: , Rfl:  .  DULoxetine (CYMBALTA) 60 MG capsule, 60 mg daily. , Disp: , Rfl:  .  FLUoxetine (PROZAC) 40 MG capsule, Take 40 mg by mouth daily., Disp: , Rfl:  .  folic acid (FOLVITE) 1 MG tablet, Take 1 tablet (1 mg total) by mouth daily., Disp: 90 tablet, Rfl: 3 .  LUMIGAN 0.01 % SOLN, Place 1 drop into both eyes at bedtime., Disp: , Rfl:  .  magnesium hydroxide (MILK OF MAGNESIA) 400 MG/5ML suspension, Take by mouth daily as needed for mild constipation., Disp: , Rfl:  .  Misc Natural Products (LEG VEIN & CIRCULATION PO), Take by mouth., Disp: , Rfl:  .  modafinil (PROVIGIL) 200 MG tablet, Take 200 mg by mouth daily. , Disp: , Rfl: 1 .  Multiple Vitamins-Minerals (ONE-A-DAY VITACRAVES  IMMUNITY PO), Take by mouth., Disp: , Rfl:  .  oxyCODONE-acetaminophen (PERCOCET) 10-325 MG tablet, Filled by pain clinic, Disp: , Rfl: 0 .  polyethylene glycol (MIRALAX / GLYCOLAX) packet, Take 17 g by mouth daily., Disp: , Rfl:  .  SUMAtriptan (IMITREX) 100 MG tablet, May repeat in 2 hours if headache  persists or recurs. Max 2 tablets in 24 hours., Disp: 9 tablet, Rfl: 1 .  thiamine (VITAMIN B-1) 100 MG tablet, Take 1 tablet (100 mg total) by mouth daily., Disp: 90 tablet, Rfl: 3 .  topiramate (TOPAMAX) 100 MG tablet, Take two tablets at bedtime., Disp: 60 tablet, Rfl: 11 .  tretinoin (RETIN-A) 0.025 % cream, Apply 1 application topically at bedtime. , Disp: , Rfl:  .  triamcinolone cream (KENALOG) 0.1 %, Apply to chin once daily at bedtime for 5 days, Disp: 30 g, Rfl: 0 .  Vitamin D, Ergocalciferol, (DRISDOL) 50000 units CAPS capsule, Take 50,000 Units by mouth every 7 (seven) days., Disp: , Rfl:  .  zolpidem (AMBIEN) 5 MG tablet, Take 5 mg by mouth at bedtime as needed for sleep., Disp: , Rfl:   EXAM:   VITALS per patient if applicable:   GENERAL: alert, oriented, appears well and in no acute distress.  Red rash of right cheek and chin  HEENT: atraumatic, conjunttiva clear, no obvious abnormalities on inspection of external nose and ears  NECK: normal movements of the head and neck  LUNGS: on inspection no signs of respiratory distress, breathing rate appears normal, no obvious gross increased work of breathing, gasping or wheezing  CV: no obvious cyanosis  MS: moves all visible extremities without noticeable abnormality  PSYCH/NEURO: pleasant and cooperative, no obvious depression or anxiety, speech and thought processing grossly intact  ASSESSMENT AND PLAN:   Eczema of face  -Suspect this to be either a chemical burn or the results of red discoloration on thick skin. -Will send in a prescription for triamcinolone cream for her to use nightly for 5 nights only.  Suspect this will resolve on its own. -Have advised no exfoliation or scrubs for the next 2 weeks.     I discussed the assessment and treatment plan with the patient. The patient was provided an opportunity to ask questions and all were answered. The patient agreed with the plan and demonstrated an understanding of  the instructions.   The patient was advised to call back or seek an in-person evaluation if the symptoms worsen or if the condition fails to improve as anticipated.    Lelon Frohlich, MD  [facility]

## 2018-11-04 ENCOUNTER — Telehealth: Payer: Self-pay | Admitting: Internal Medicine

## 2018-11-04 DIAGNOSIS — L309 Dermatitis, unspecified: Secondary | ICD-10-CM

## 2018-11-04 MED ORDER — TRIAMCINOLONE ACETONIDE 0.1 % EX CREA: TOPICAL_CREAM | CUTANEOUS | 0 refills | Status: DC

## 2018-11-04 NOTE — Telephone Encounter (Signed)
Refill sent.

## 2018-11-04 NOTE — Telephone Encounter (Signed)
Pt called to follow up on rx for triamcinolone cream (KENALOG) 0.1 %. She has used it for 5 days and it seems to be working. She would like to know if she should continue using it for a couple more days as she has a few more small patches to clear up. Requesting [phone]

## 2018-11-09 ENCOUNTER — Ambulatory Visit (INDEPENDENT_AMBULATORY_CARE_PROVIDER_SITE_OTHER): Payer: Medicare Other | Admitting: Neurology

## 2018-11-09 ENCOUNTER — Encounter: Payer: Self-pay | Admitting: Neurology

## 2018-11-09 ENCOUNTER — Other Ambulatory Visit: Payer: Self-pay

## 2018-11-09 VITALS — BP 126/76 | HR 87 | Temp 98.2°F | Ht 60.0 in | Wt 123.0 lb

## 2018-11-09 DIAGNOSIS — G43909 Migraine, unspecified, not intractable, without status migrainosus: Secondary | ICD-10-CM | POA: Insufficient documentation

## 2018-11-09 DIAGNOSIS — R5383 Other fatigue: Secondary | ICD-10-CM | POA: Diagnosis not present

## 2018-11-09 MED ORDER — PROPRANOLOL HCL ER 60 MG PO CP24: 60.0000 mg | ORAL_CAPSULE | Freq: Every day | ORAL | 11 refills | Status: DC

## 2018-11-09 MED ORDER — SUMATRIPTAN SUCCINATE 100 MG PO TABS: ORAL_TABLET | ORAL | 6 refills | Status: DC

## 2018-11-09 NOTE — Progress Notes (Signed)
PATIENT: Jill Salinas DOB: 1945-10-29  Chief Complaint  Patient presents with   Peripheral Neuropathy    She has burning pain in her bilateral legs and feet.  Feels like they are "on fire".  She had an adverse reaction to gabapentin (makes her "feel crazy").    Migraine    She is taking topiramate 200mg  at bedtime.  She estimates having at least one migraine weekly.       HISTORICAL   She has migraines for many many years currently well controlled on 200 mg of Topamax at night. She also takes gabapentin 900mg  daily, which has helped her migraines as well. She uses Imitrex acutely both the injectable and the oral preparation. She thinks her headaches are in good control. She used imitrex po first, if that does not take care of her headaches, she would use injections.  This an early visit for her complains of 2 months history of right side neck pain, right occipital area pain, it bothers her 2/week, lasting all day, she felt spacy dealing with the pain, she does not like to drive,  She also had trouble walking, do not have the strength to get up an even steps, xone year, she has no incontinence, more so on the left leg, no involvement of left arm, she also has left knee swelling, felt so bad. She had 30 Lb weight gain over one year "can not get into any of my cloth" She also complains of low back pain, radiating pain to her left leg, has tried physical therapy recently, which has been helpful, no incontinence.   UPDATE June 24th 2015:YY EMG/NCS in June 24th 2015 showed no evidence of left lumbar radiculopathy, there was evidence of right triceps irritation, but no neuropathic changes at other selected right upper extremity needle examination.  We have reviewed MRI lumbar film together, only mild degenerative disc disease, there was no significant foraminal, or canal stenosis   She is taking Cymbalta now, which has helped her some, but continued complaint bilateral lower extremity  deep achy pain, especially at nighttime, she no longer has shooting pain from her neck to her shoulders, or arms, but she complains of deep achy pain in her midline upper cervical region,  She also complains fatigued easily, lack of stamina She had left lumbar epidural injection, by interventional radiologist Dr. Lawrence Santiago at left L4-5 space, in August 24th 2015, she only had transient improvement,  Now she complains of left lower extremity pain, bilateral lower extremity deep achy pain, feet paresthesia, gait difficulty,  UPDATE April 25th 2016:YY She complains of two months history of neck pain, spreading forward to bilateral retrorbital area, daily, can go up to 10/10, radiating to her right shoulder, woke up at night with headaches,has been using frequent Imitrex tablet, sometimes injection  She has history of migraine all her life, previous headaches is lateralized severe pounding headache, now it is at her neck, She tries to sleep different ways,Change pillows without helping  She is under pain management for her low back pain,is taking daily hydrocodone, tried Elavil before, complains of dizziness, weight gain.  Update July third 2017:YY She has severe constipation, has not used bathroom for 3 months,  She complains of bloating, tried different methods, medications, abdomen massage,  Her migraine has much improved, she has migraines headaches every few times, she is taking imitrex as needed, which has been helpful she is worried about side effect of Trokendi xr, I will decrease the dosage from  200mg  to 100mg  qhs.  UPDATE May 12 2017: Today she coming with different complaints, complains of frequent awakening at nighttime, drenching sweats, pain in her legs, difficulty falling to sleep, chronic constipation,  Laboratory evaluations in October 2017 showed mild anemia hemoglobin of 12.5, normal CMP,  MRI of cervical and lumbar spine 2015 showed degenerative changes, but there  was no significant canal or foraminal narrowing.  Update November 09, 2018: She complains of depression, poor appetite, diffuse body achy pain, frequent almost daily headaches, despite polypharmacy treatment, including Prozac 40 mg daily, Topamax 100 mg 2 tablets at bedtime, Imitrex as needed is helpful most of the time.  REVIEW OF SYSTEMS: Full 14 system review of systems performed and notable only for as above All other review of systems were negative.  ALLERGIES: Allergies  Allergen Reactions   Iodinated Diagnostic Agents Other (See Comments)    Tthroat itching Tthroat itching, pt received a 13 hour prep for injection   Codeine    Depakote [Divalproex Sodium] Nausea And Vomiting   Gabapentin Other (See Comments)    Made her "feel crazy"   Nortriptyline Other (See Comments)    Reported swelling and incomplete emptying of bladder.    HOME MEDICATIONS: Current Outpatient Medications  Medication Sig Dispense Refill   Buprenorphine 15 MCG/HR PTWK as needed.     denosumab (PROLIA) 60 MG/ML SOLN injection Inject 60 mg into the skin every 6 (six) months. Administer in upper arm, thigh, or abdomen     diclofenac sodium (VOLTAREN) 1 % GEL Apply 2 g topically as needed.     DULoxetine (CYMBALTA) 60 MG capsule 60 mg daily.      FLUoxetine (PROZAC) 40 MG capsule Take 40 mg by mouth daily.     folic acid (FOLVITE) 1 MG tablet Take 1 tablet (1 mg total) by mouth daily. 90 tablet 3   linaclotide (LINZESS) 290 MCG CAPS capsule Take 290 mcg by mouth daily before breakfast.     LUMIGAN 0.01 % SOLN Place 1 drop into both eyes at bedtime.     magnesium hydroxide (MILK OF MAGNESIA) 400 MG/5ML suspension Take by mouth daily as needed for mild constipation.     Misc Natural Products (LEG VEIN & CIRCULATION PO) Take by mouth.     modafinil (PROVIGIL) 200 MG tablet Take 200 mg by mouth daily as needed.   1   Multiple Vitamins-Minerals (ONE-A-DAY VITACRAVES IMMUNITY PO) Take by mouth.      oxyCODONE-acetaminophen (PERCOCET) 10-325 MG tablet Filled by pain clinic  0   polyethylene glycol (MIRALAX / GLYCOLAX) packet Take 17 g by mouth daily.     SUMAtriptan (IMITREX) 100 MG tablet May repeat in 2 hours if headache persists or recurs. Max 2 tablets in 24 hours. 9 tablet 1   thiamine (VITAMIN B-1) 100 MG tablet Take 1 tablet (100 mg total) by mouth daily. 90 tablet 3   topiramate (TOPAMAX) 100 MG tablet Take two tablets at bedtime. 60 tablet 11   tretinoin (RETIN-A) 0.025 % cream Apply 1 application topically at bedtime.      triamcinolone cream (KENALOG) 0.1 % Apply to chin once  at bedtime as needed 30 g 0   vitamin B-12 (CYANOCOBALAMIN) 1000 MCG tablet Take 1,000 mcg by mouth daily.     Vitamin D, Ergocalciferol, (DRISDOL) 50000 units CAPS capsule Take 50,000 Units by mouth every 7 (seven) days.     zolpidem (AMBIEN) 5 MG tablet Take 5 mg by mouth at bedtime as needed for  sleep.     No current facility-administered medications for this visit.     PAST MEDICAL HISTORY: Past Medical History:  Diagnosis Date   Bowel obstruction ([place])    Breast cancer ([place])    Headache(784.0)    Migraine    Vision abnormalities    glaucoma    PAST SURGICAL HISTORY: Past Surgical History:  Procedure Laterality Date   BREAST LUMPECTOMY     TOTAL ABDOMINAL HYSTERECTOMY N/A     FAMILY HISTORY: Family History  Problem Relation Age of Onset   Heart disease Mother    Headache Mother    Heart attack Father     SOCIAL HISTORY: Social History   Socioeconomic History   Marital status: Married    Spouse name: Jill Salinas    Number of children: 0   Years of education: Assoc   Highest education level: Not on file  Occupational History   Not on file  Social Needs   Financial resource strain: Not on file   Food insecurity    Worry: Not on file    Inability: Not on file   Transportation needs    Medical: Not on file    Non-medical: Not on file  Tobacco  Use   Smoking status: Never Smoker   Smokeless tobacco: Never Used  Substance and Sexual Activity   Alcohol use: Yes    Comment: drinks on the weekend   Drug use: No   Sexual activity: Not on file  Lifestyle   Physical activity    Days per week: Not on file    Minutes per session: Not on file   Stress: Not on file  Relationships   Social connections    Talks on phone: Not on file    Gets together: Not on file    Attends religious service: Not on file    Active member of club or organization: Not on file    Attends meetings of clubs or organizations: Not on file    Relationship status: Not on file   Intimate partner violence    Fear of current or ex partner: Not on file    Emotionally abused: Not on file    Physically abused: Not on file    Forced sexual activity: Not on file  Other Topics Concern   Not on file  Social History Narrative   Patient lives at home with her husband. Jill Salinas    Patient has an Geophysicist/field seismologist.    Patient has no children.    Patient is right handed.      PHYSICAL EXAM   Vitals:   11/09/18 1403  BP: 126/76  Pulse: 87  Temp: 98.2 F (36.8 C)  Weight: 123 lb (55.8 kg)  Height: 5' (1.524 m)    Not recorded      Body mass index is 24.02 kg/m.  PHYSICAL EXAMNIATION:  Gen: NAD, conversant, well nourised, well groomed                     Cardiovascular: Regular rate rhythm, no peripheral edema, warm, nontender. Eyes: Conjunctivae clear without exudates or hemorrhage Neck: Supple, no carotid bruits. Pulmonary: Clear to auscultation bilaterally   NEUROLOGICAL EXAM:  MENTAL STATUS: Speech:    Speech is normal; fluent and spontaneous with normal comprehension.  Cognition:     Orientation to time, place and person     Normal recent and remote memory     Normal Attention span and concentration     Normal Language,  naming, repeating,spontaneous speech     Fund of knowledge   CRANIAL NERVES: CN II: Visual fields are full  to confrontation.  Pupils are round equal and briskly reactive to light. CN III, IV, VI: extraocular movement are normal. No ptosis. CN V: Facial sensation is intact to pinprick in all 3 divisions bilaterally. Corneal responses are intact.  CN VII: Face is symmetric with normal eye closure and smile. CN VIII: Hearing is normal to causal conversation. CN IX, X: Palate elevates symmetrically. Phonation is normal. CN XI: Head turning and shoulder shrug are intact CN XII: Tongue is midline with normal movements and no atrophy.  MOTOR: There is no pronator drift of out-stretched arms. Muscle bulk and tone are normal. Muscle strength is normal.  REFLEXES: Reflexes are 2+ and symmetric at the biceps, triceps, knees, and ankles. Plantar responses are flexor.  SENSORY: Intact to light touch, pinprick, positional sensation and vibratory sensation are intact in fingers and toes.  COORDINATION: Rapid alternating movements and fine finger movements are intact. There is no dysmetria on finger-to-nose and heel-knee-shin.    GAIT/STANCE: She needs push-up to get up from seated position, steady,  DIAGNOSTIC DATA (LABS, IMAGING, TESTING) - I reviewed patient records, labs, notes, testing and imaging myself where available.   ASSESSMENT AND PLAN  Jill Salinas is a 73 y.o. female   Chronic migraine headaches Diffuse body achy pain, depression anxiety, polypharmacy treatment.  Add on Inderal LA 60mg  daily for headache prevention  Recommended her see psychiatry for better control of her mood disorder  Marcial Pacas, M.D. Ph.D.  [facility] [address] [place], [place] [address] Ph: [phone] Fax: [phone]  CC: Referring Provider

## 2018-11-12 ENCOUNTER — Telehealth: Payer: Self-pay | Admitting: Internal Medicine

## 2018-11-12 NOTE — Telephone Encounter (Signed)
Yes, ok to take

## 2018-11-12 NOTE — Telephone Encounter (Signed)
Patient is aware 

## 2018-11-12 NOTE — Telephone Encounter (Signed)
Pt states that she saw her neurologist and was given  Propranolol S.R. Pt would like to check and make sure with Dr. Jerilee Hoh that this medication is okay for her to take. Pt can be reached at [phone].

## 2018-11-24 ENCOUNTER — Encounter: Payer: Self-pay | Admitting: Internal Medicine

## 2018-11-24 LAB — HM MAMMOGRAPHY

## 2018-11-25 ENCOUNTER — Telehealth: Payer: Self-pay | Admitting: Internal Medicine

## 2018-11-25 NOTE — Telephone Encounter (Signed)
Spoke with patient and office visit scheduled.

## 2018-11-25 NOTE — Telephone Encounter (Signed)
Pt says that she has been seen for having pain in her private area as well as left leg pain. Pt would like to be advised on what should she do?    CB: [phone] or on mobile   Please assist pt further.

## 2018-11-25 NOTE — Telephone Encounter (Signed)
Will need OV

## 2018-11-27 ENCOUNTER — Telehealth: Payer: Self-pay | Admitting: Neurology

## 2018-11-27 NOTE — Telephone Encounter (Signed)
Pt states she is no longer going to take the propranololpt states it causes her stomach to be swollen, it causes her back to hurt and she is having a horrible time with it.  Pt is also asking if Dr Krista Blue will put her on something for migraines and take her off of topiramate (TOPAMAX) 100 MG tablet.  Please call

## 2018-11-30 MED ORDER — BUTALBITAL-APAP-CAFFEINE 50-325-40 MG PO TABS: 1.0000 | ORAL_TABLET | Freq: Four times a day (QID) | ORAL | 5 refills | Status: DC | PRN

## 2018-11-30 NOTE — Telephone Encounter (Signed)
Spoke with patient and advised her of Dr Rhea Belton reply and two new Rx. She stated she already takes duloxetine (Cymbalta)  60 mg. She still would like to know if Dr Krista Blue will replace topamax with a different migraine preventative that is not a "seizure" medication.  She also asked if she can take sumatriptan too, but she stated the last refill she got was packaged differently and wasn't effective. I advised if sumatriptan isn't effective, she shouldn't take it.  Advised I will send message to Dr Krista Blue and let her know. Patient verbalized understanding, appreciation.

## 2018-11-30 NOTE — Telephone Encounter (Signed)
Options of migraine preventions are nortriptyline 20mg  qhs or CGRP antagonist.  May consider low dose maxalt 5mg  prn if triptan works well for her in the past

## 2018-11-30 NOTE — Addendum Note (Signed)
Addended by: Marcial Pacas on: 11/30/2018 04:30 PM   Modules accepted: Orders

## 2018-11-30 NOTE — Telephone Encounter (Addendum)
Spoke with patient who stated she'd heard that taking seizure medications can cause a lot of problems, some of which she already has. She wants to know if she can have a headache medicine that's just for headaches. She stated her legs are hurting badly, and the pain seems to be getting worse. She would like to know if Dr Krista Blue will prescribe something else. As far as propanolol, she stopped it 1 week ago due to side effects. I advised will send this to Dr Krista Blue, and she'll get a call back. Patient verbalized understanding, appreciation.

## 2018-11-30 NOTE — Telephone Encounter (Signed)
Left message requesting a return call.

## 2018-11-30 NOTE — Telephone Encounter (Signed)
Patient has multiple complaints, I have called in Cymbalta 60 mg for her which should be a mood stabilizer and also good for musculoskeletal pain.   I have call in Fioricet as needed for headaches, limit the use to less than twice each week

## 2018-11-30 NOTE — Telephone Encounter (Signed)
Please check why she does not want to take topamax 100mg  any more, I can add on Lamotrigine 25mg  titirating to 100mg  bid.

## 2018-12-01 ENCOUNTER — Ambulatory Visit (INDEPENDENT_AMBULATORY_CARE_PROVIDER_SITE_OTHER): Payer: Medicare Other | Admitting: Internal Medicine

## 2018-12-01 ENCOUNTER — Encounter: Payer: Self-pay | Admitting: Internal Medicine

## 2018-12-01 ENCOUNTER — Other Ambulatory Visit: Payer: Self-pay

## 2018-12-01 ENCOUNTER — Other Ambulatory Visit (HOSPITAL_COMMUNITY)
Admission: RE | Admit: 2018-12-01 | Discharge: 2018-12-01 | Disposition: A | Discharge status: Home / Self Care | Payer: Medicare Other | Source: Ambulatory Visit | Attending: Internal Medicine | Admitting: Internal Medicine

## 2018-12-01 VITALS — BP 110/70 | HR 102 | Temp 98.0°F | Wt 126.4 lb

## 2018-12-01 DIAGNOSIS — Z1151 Encounter for screening for human papillomavirus (HPV): Secondary | ICD-10-CM | POA: Diagnosis not present

## 2018-12-01 DIAGNOSIS — M545 Low back pain, unspecified: Secondary | ICD-10-CM

## 2018-12-01 DIAGNOSIS — N949 Unspecified condition associated with female genital organs and menstrual cycle: Secondary | ICD-10-CM

## 2018-12-01 DIAGNOSIS — G8929 Other chronic pain: Secondary | ICD-10-CM | POA: Diagnosis not present

## 2018-12-01 MED ORDER — NORTRIPTYLINE HCL 10 MG PO CAPS: ORAL_CAPSULE | ORAL | 5 refills | Status: DC

## 2018-12-01 NOTE — Telephone Encounter (Addendum)
Spoke to patient.  She would like to try nortriptyline 20mg  QHS and stop topiramate.  She feels sumatriptan works well but likes one Ambulance person over others.  She is going to discuss this with her pharmacy.  She is aware that she should either use sumatriptan or fioricet for rescue options.   Per vo by Dr. Krista Blue:  1) She is currently on topiramate 100mg , two tabs QHS.  She will decrease her dose to one tab QHS x 1 week then stop.  2) New rx sent to pharmacy for nortriptyline 10mg , take on cap QHS x 1 week then increase to two caps QHS thereafter.  The patient verbalized understanding of the directions above.  Patient previously thought symptoms of swelling and incomplete emptying of bladder were r/t to nortriptyline.  However, she continued to have symptoms after stopping her medication last year.  She is working w/ her PCP.  She is agreeable to try nortriptyline again.

## 2018-12-01 NOTE — Progress Notes (Signed)
Acute office Visit     CC/Reason for Visit: Pelvic pain  HPI: Jill Salinas is a 73 y.o. female who is coming in today for the above mentioned reasons.  She has scheduled this visit due to acute pelvic pain.  She started noticing a "heat sensation", pressure sensation in her private area both vaginal and rectally about 3 weeks ago.  She started using an over-the-counter yeast medication thinking that it might be yeast but this did not provide any relief.  She denies itching, vaginal discharge, fever, dysuria or any other urinary symptoms.  She has agreed to proceed with a pelvic exam today  Past Medical/Surgical History: Past Medical History:  Diagnosis Date  . Bowel obstruction ([place])   . Breast cancer ([place])   . Headache(784.0)   . Migraine   . Vision abnormalities    glaucoma    Past Surgical History:  Procedure Laterality Date  . BREAST LUMPECTOMY    . TOTAL ABDOMINAL HYSTERECTOMY N/A     Social History:  reports that she has never smoked. She has never used smokeless tobacco. She reports current alcohol use. She reports that she does not use drugs.  Allergies: Allergies  Allergen Reactions  . Iodinated Diagnostic Agents Other (See Comments)    Tthroat itching Tthroat itching, pt received a 13 hour prep for injection  . Codeine   . Depakote [Divalproex Sodium] Nausea And Vomiting  . Gabapentin Other (See Comments)    Made her "feel crazy"    Family History:  Family History  Problem Relation Age of Onset  . Heart disease Mother   . Headache Mother   . Heart attack Father      Current Outpatient Medications:  .  Buprenorphine 15 MCG/HR PTWK, as needed., Disp: , Rfl:  .  butalbital-acetaminophen-caffeine (FIORICET) 50-325-40 MG tablet, Take 1 tablet by mouth every 6 (six) hours as needed for headache. Do not refill in less than 30 days, Disp: 12 tablet, Rfl: 5 .  denosumab (PROLIA) 60 MG/ML SOLN injection, Inject 60 mg into the skin every 6 (six) months.  Administer in upper arm, thigh, or abdomen, Disp: , Rfl:  .  diclofenac sodium (VOLTAREN) 1 % GEL, Apply 2 g topically as needed., Disp: , Rfl:  .  FLUoxetine (PROZAC) 40 MG capsule, Take 40 mg by mouth daily., Disp: , Rfl:  .  folic acid (FOLVITE) 1 MG tablet, Take 1 tablet (1 mg total) by mouth daily., Disp: 90 tablet, Rfl: 3 .  linaclotide (LINZESS) 290 MCG CAPS capsule, Take 290 mcg by mouth daily before breakfast., Disp: , Rfl:  .  LUMIGAN 0.01 % SOLN, Place 1 drop into both eyes at bedtime., Disp: , Rfl:  .  magnesium hydroxide (MILK OF MAGNESIA) 400 MG/5ML suspension, Take by mouth daily as needed for mild constipation., Disp: , Rfl:  .  Misc Natural Products (LEG VEIN & CIRCULATION PO), Take by mouth., Disp: , Rfl:  .  modafinil (PROVIGIL) 200 MG tablet, Take 200 mg by mouth daily as needed. , Disp: , Rfl: 1 .  Multiple Vitamins-Minerals (ONE-A-DAY VITACRAVES IMMUNITY PO), Take by mouth., Disp: , Rfl:  .  nortriptyline (PAMELOR) 10 MG capsule, Take one cap QHS x 1 week, then two caps QHS thereafter., Disp: 60 capsule, Rfl: 5 .  oxyCODONE-acetaminophen (PERCOCET) 10-325 MG tablet, Filled by pain clinic, Disp: , Rfl: 0 .  polyethylene glycol (MIRALAX / GLYCOLAX) packet, Take 17 g by mouth daily., Disp: , Rfl:  .  SUMAtriptan (IMITREX) 100 MG tablet, May repeat in 2 hours if headache persists or recurs. Max 2 tablets in 24 hours., Disp: 9 tablet, Rfl: 6 .  thiamine (VITAMIN B-1) 100 MG tablet, Take 1 tablet (100 mg total) by mouth daily., Disp: 90 tablet, Rfl: 3 .  topiramate (TOPAMAX) 100 MG tablet, Take two tablets at bedtime., Disp: 60 tablet, Rfl: 11 .  tretinoin (RETIN-A) 0.025 % cream, Apply 1 application topically at bedtime. , Disp: , Rfl:  .  triamcinolone cream (KENALOG) 0.1 %, Apply to chin once  at bedtime as needed, Disp: 30 g, Rfl: 0 .  vitamin B-12 (CYANOCOBALAMIN) 1000 MCG tablet, Take 1,000 mcg by mouth daily., Disp: , Rfl:  .  Vitamin D, Ergocalciferol, (DRISDOL) 50000 units  CAPS capsule, Take 50,000 Units by mouth every 7 (seven) days., Disp: , Rfl:  .  zolpidem (AMBIEN) 5 MG tablet, Take 5 mg by mouth at bedtime as needed for sleep., Disp: , Rfl:  .  amitriptyline (ELAVIL) 10 MG tablet, Take 10 mg by mouth at bedtime., Disp: , Rfl:   Review of Systems:  Constitutional: Denies fever, chills, diaphoresis, appetite change and fatigue.  HEENT: Denies photophobia, eye pain, redness, hearing loss, ear pain, congestion, sore throat, rhinorrhea, sneezing, mouth sores, trouble swallowing, neck pain, neck stiffness and tinnitus.   Respiratory: Denies SOB, DOE, cough, chest tightness,  and wheezing.   Cardiovascular: Denies chest pain, palpitations and leg swelling.  Gastrointestinal: Denies nausea, vomiting, abdominal pain, diarrhea, constipation, blood in stool and abdominal distention.  Genitourinary: Denies dysuria, urgency, frequency, hematuria, flank pain and difficulty urinating.  Endocrine: Denies: hot or cold intolerance, sweats, changes in hair or nails, polyuria, polydipsia. Musculoskeletal: Denies myalgias, back pain, joint swelling, arthralgias and gait problem.  Skin: Denies pallor, rash and wound.  Neurological: Denies dizziness, seizures, syncope, weakness, light-headedness, numbness and headaches.  Hematological: Denies adenopathy. Easy bruising, personal or family bleeding history  Psychiatric/Behavioral: Denies suicidal ideation, mood changes, confusion, nervousness, sleep disturbance and agitation    Physical Exam: Vitals:   12/01/18 1329  BP: 110/70  Pulse: (!) 102  Temp: 98 F (36.7 C)  TempSrc: Oral  SpO2: 97%  Weight: 126 lb 6.4 oz (57.3 kg)    Body mass index is 24.69 kg/m.   Constitutional: NAD, calm, comfortable Eyes: PERRL, lids and conjunctivae normal ENMT: Mucous membranes are moist.  Abdomen: no tenderness, no masses palpated. No hepatosplenomegaly. Bowel sounds positive.  Genitourinary: Pelvic exam performed today.  Upon  insertion of speculum she was noticed to have a small protruding mass from her cervical os. Neurologic: Grossly intact and nonfocal Psychiatric: Normal judgment and insight. Alert and oriented x 3. Normal mood.    Impression and Plan:  Vaginal discomfort  Chronic left-sided low back pain, unspecified whether sciatica present -Based on speculum exam today, I think she might have a prolapsed uterus, this might explain some of the pressure sensation.  Might also be a cervical polyp. -In any case, referral to GYN has been initiated today. -Since a speculum exam was performed today, went ahead and did a Pap smear, also wet prep was sent.  No vaginal discharge or discoloration was present.   Time spent: 25 minutes. Greater than 50% of this time was spent in direct contact with the patient, coordinating care and discussing relevant ongoing clinical issues, including potential etiologies and performing speculum exam.   Patient Instructions  -Nice seeing you today!!  -Will let you know your test results once they are available  to me.  -Referral to GYN today.     Lelon Frohlich, MD [facility] at [facility]

## 2018-12-01 NOTE — Addendum Note (Signed)
Addended by: Noberto Retort C on: 12/01/2018 11:51 AM   Modules accepted: Orders

## 2018-12-01 NOTE — Patient Instructions (Signed)
-  Nice seeing you today!!  -Will let you know your test results once they are available to me.  -Referral to GYN today.

## 2018-12-03 LAB — CYTOLOGY - PAP
Chlamydia: NEGATIVE
Comment: NEGATIVE
Comment: NEGATIVE
Comment: NEGATIVE
Comment: NORMAL
Diagnosis: NEGATIVE
High risk HPV: NEGATIVE
Neisseria Gonorrhea: NEGATIVE
Trichomonas: NEGATIVE

## 2018-12-09 LAB — CERVICOVAGINAL ANCILLARY ONLY
Bacterial Vaginitis (gardnerella): NEGATIVE
Candida Glabrata: NEGATIVE
Candida Vaginitis: NEGATIVE
Chlamydia: NEGATIVE
Comment: NEGATIVE
Comment: NEGATIVE
Comment: NEGATIVE
Comment: NEGATIVE
Comment: NEGATIVE
Comment: NORMAL
Neisseria Gonorrhea: NEGATIVE
Trichomonas: NEGATIVE

## 2018-12-15 ENCOUNTER — Other Ambulatory Visit: Payer: Self-pay | Admitting: Internal Medicine

## 2018-12-15 ENCOUNTER — Telehealth: Payer: Self-pay | Admitting: Internal Medicine

## 2018-12-15 NOTE — Telephone Encounter (Signed)
Requested medication (s) are due for refill today: yes  Requested medication (s) are on the active medication list: yes  Last refill:  11/09/2018  Future visit scheduled: yes  Notes to clinic: last filled by historical provider    Requested Prescriptions  Pending Prescriptions Disp Refills   vitamin B-12 (CYANOCOBALAMIN) 1000 MCG tablet       Sig: Take 1 tablet (1,000 mcg total) by mouth daily.     Off-Protocol Failed - 12/15/2018 10:55 AM      Failed - Medication not assigned to a protocol, review manually.      Passed - Valid encounter within last 12 months    Recent Outpatient Visits          2 weeks ago Vaginal discomfort   [place] at [place], [place]   1 month ago Eczema of face   [place] at [facility], Rayford Halsted, MD   3 months ago Depression, recurrent [facility]   [place] at [facility], Rayford Halsted, MD      Future Appointments            In 2 months Isaac Bliss, Rayford Halsted, MD [place] at [place], [facility]            Vitamin D, Ergocalciferol, (DRISDOL) 1.25 MG (50000 UT) CAPS capsule 5 capsule     Sig: Take 1 capsule (50,000 Units total) by mouth every 7 (seven) days.     Endocrinology:  Vitamins - Vitamin D Supplementation Failed - 12/15/2018 10:55 AM      Failed - 50,000 IU strengths are not delegated      Failed - Phosphate in normal range and within 360 days    No results found for: PHOS       Failed - Vitamin D in normal range and within 360 days    VITD  Date Value Ref Range Status  09/16/2018 78.31 30.00 - 100.00 ng/mL Final         Passed - Ca in normal range and within 360 days    Calcium  Date Value Ref Range Status  09/16/2018 9.6 8.4 - 10.5 mg/dL Final         Passed - Valid encounter within last 12 months    Recent Outpatient Visits          2 weeks ago Vaginal discomfort   [place] at [facility], Rayford Halsted, MD   1  month ago Eczema of face   [place] at [facility], Rayford Halsted, MD   3 months ago Depression, recurrent [facility])   [place] at [facility], Rayford Halsted, MD      Future Appointments            In 2 months Isaac Bliss, Rayford Halsted, MD [employer] at [place], [facility]

## 2018-12-15 NOTE — Telephone Encounter (Signed)
vitamin B-12 (CYANOCOBALAMIN) 1000 MCG tablet  Vitamin D, Ergocalciferol, (DRISDOL) 50000 units CAPS capsule  Pt states that Dr. Lemmie Evens is taking over all her scripts and wants refills just saw her 10/20  [id number] - [place], [place] - [place] [address]. AT [address] [phone] (Phone) [phone] (Fax)

## 2018-12-15 NOTE — Telephone Encounter (Signed)
Do we know what this is about?

## 2018-12-15 NOTE — Telephone Encounter (Signed)
pls reach out to pt re.Marland Kitchen a new script that DR H has never filled for pt. "Linzess" did not see in chart. Call [phone]

## 2018-12-15 NOTE — Telephone Encounter (Signed)
Denied.  Last filled Magda Bernheim, MD

## 2018-12-17 ENCOUNTER — Other Ambulatory Visit: Payer: Self-pay | Admitting: Internal Medicine

## 2018-12-17 ENCOUNTER — Ambulatory Visit: Payer: Medicare Other | Admitting: Internal Medicine

## 2018-12-17 DIAGNOSIS — F339 Major depressive disorder, recurrent, unspecified: Secondary | ICD-10-CM

## 2018-12-17 MED ORDER — AJOVY 225 MG/1.5ML ~~LOC~~ SOAJ: 1.5000 mL | SUBCUTANEOUS | 11 refills | Status: DC

## 2018-12-17 NOTE — Addendum Note (Signed)
Addended by: Noberto Retort C on: 12/17/2018 04:00 PM   Modules accepted: Orders

## 2018-12-17 NOTE — Telephone Encounter (Signed)
Medication Refill - Medication: linzess, Vitamin D, folvite, thiamine   Has the patient contacted their pharmacy? No.   Pt states she only has 2 pills left and is requesting to have this sent in today. Please advise.  (Agent: If no, request that the patient contact the pharmacy for the refill.) (Agent: If yes, when and what did the pharmacy advise?)  Preferred Pharmacy (with phone number or street name):  [id number] [place], [place]. AT [address] [place] [address]  Phone: [phone] Fax: [phone]  Not a 24 hour pharmacy; exact hours not known.     Agent: Please be advised that RX refills may take up to 3 business days. We ask that you follow-up with your pharmacy.

## 2018-12-17 NOTE — Telephone Encounter (Signed)
Requested medication (s) are due for refill today: yes  Requested medication (s) are on the active medication list: yes  Last refill:  11/09/2018  Future visit scheduled: yes  Notes to clinic:  Patient has one pill left  Review for refill Last filled by historical provider    Requested Prescriptions  Pending Prescriptions Disp Refills   linaclotide (LINZESS) 290 MCG CAPS capsule 30 capsule     Sig: Take 1 capsule (290 mcg total) by mouth daily before breakfast.     Gastroenterology: Irritable Bowel Syndrome Passed - 12/17/2018  9:36 AM      Passed - Valid encounter within last 12 months    Recent Outpatient Visits          2 weeks ago Vaginal discomfort   [place] at [facility], Rayford Halsted, MD   1 month ago Eczema of face   [place] at [facility] Rayford Halsted, MD   3 months ago Depression, recurrent [facility])   [place] at [facility], Rayford Halsted, MD      Future Appointments            In 2 months Isaac Bliss, Rayford Halsted, MD [place] at [place], [facility]            Vitamin D, Ergocalciferol, (DRISDOL) 1.25 MG (50000 UT) CAPS capsule 5 capsule     Sig: Take 1 capsule (50,000 Units total) by mouth every 7 (seven) days.     Endocrinology:  Vitamins - Vitamin D Supplementation Failed - 12/17/2018  9:36 AM      Failed - 50,000 IU strengths are not delegated      Failed - Phosphate in normal range and within 360 days    No results found for: PHOS       Failed - Vitamin D in normal range and within 360 days    VITD  Date Value Ref Range Status  09/16/2018 78.31 30.00 - 100.00 ng/mL Final         Passed - Ca in normal range and within 360 days    Calcium  Date Value Ref Range Status  09/16/2018 9.6 8.4 - 10.5 mg/dL Final         Passed - Valid encounter within last 12 months    Recent Outpatient Visits          2 weeks ago Vaginal discomfort   [place] at [employer], Rayford Halsted, MD   1 month ago Eczema of face   [place] at [facility], Rayford Halsted, MD   3 months ago Depression, recurrent [facility])   [place] at [facility], Rayford Halsted, MD      Future Appointments            In 2 months Isaac Bliss, Rayford Halsted, MD [employer] at [place], [facility]

## 2018-12-17 NOTE — Telephone Encounter (Signed)
Pt has called to report that since on the nortriptyline the lower half of her body (legs mainly) feel heat.  Pt states also she has pain on the left hand side of her back and her stomach is swollen.  This has all taken place since on the small dose of the nortriptyline.  Please call

## 2018-12-17 NOTE — Telephone Encounter (Signed)
I returned the call to the patient. States the symptoms reported below are not really new for her.  However, she does feel her chronic constipation has been made worse by nortriptyline.  She would like to try the previously offered CGRP.  Per vo by Dr. Krista Blue, provide rx for Avovy 225mg , one injection monthly.  The patient is agreeable to this plan.  She will stop the nortriptyline.

## 2018-12-21 ENCOUNTER — Telehealth: Payer: Self-pay | Admitting: *Deleted

## 2018-12-21 MED ORDER — ONDANSETRON HCL 4 MG PO TABS: ORAL_TABLET | ORAL | 0 refills | Status: DC

## 2018-12-21 NOTE — Telephone Encounter (Signed)
Nortriptyline was stopped due to worsening constipation.  States her chronic pain has become more bothersome but she is going to discuss this with her pain management physician.    Also, she is having nausea with her migraines and would like a medication sent to the pharmacy.

## 2018-12-21 NOTE — Telephone Encounter (Signed)
PA for Ajovy completed via fax through [facility] (ph: [phone], fax: [phone]).  Pt [id number].  Decision pending.

## 2018-12-21 NOTE — Addendum Note (Signed)
Addended by: Noberto Retort C on: 12/21/2018 02:20 PM   Modules accepted: Orders

## 2018-12-21 NOTE — Telephone Encounter (Signed)
Pt is asking for a call from RN to discuss if she can take the Fremanezumab-vfrm (AJOVY) 225 MG/1.5ML SOAJ with her nortriptyline .  Pt is asking also if she can take her nortriptyline  with her 2 anti depressants, please call

## 2018-12-21 NOTE — Telephone Encounter (Signed)
Per vo by Dr. Krista Blue, okay to provide prescription for ondansetron 4mg  tablets, #20 x 0, one tab q8h prn for nausea.  Rx sent to pharmacy.

## 2018-12-22 MED ORDER — EMGALITY 120 MG/ML ~~LOC~~ SOAJ: 120.0000 mg | SUBCUTANEOUS | 11 refills | Status: DC

## 2018-12-22 MED ORDER — EMGALITY 120 MG/ML ~~LOC~~ SOAJ: 240.0000 mg | SUBCUTANEOUS | 0 refills | Status: DC

## 2018-12-22 NOTE — Telephone Encounter (Signed)
PA approved for [employer]. Approved through 03/24/2019.

## 2018-12-22 NOTE — Addendum Note (Signed)
Addended by: Desmond Lope on: 12/22/2018 04:33 PM   Modules accepted: Orders

## 2018-12-22 NOTE — Telephone Encounter (Signed)
PA for Ajovy denied due to not being on formulary.  They will cover Emgality but it will still need prior authoization.  Per vo by Dr. Krista Blue, she will authorize new prescription for [facility].  New PA initiated for [facility].

## 2018-12-28 ENCOUNTER — Telehealth: Payer: Self-pay | Admitting: Internal Medicine

## 2018-12-28 NOTE — Telephone Encounter (Signed)
Pt has Gout in her big toe of her left foot and Pt wants to know if something can be called in for her/ Pt stated she has taken Colchicine or/and Indomethacin before / please advise

## 2018-12-29 ENCOUNTER — Other Ambulatory Visit: Payer: Self-pay

## 2018-12-29 ENCOUNTER — Other Ambulatory Visit (INDEPENDENT_AMBULATORY_CARE_PROVIDER_SITE_OTHER): Payer: Medicare Other

## 2018-12-29 ENCOUNTER — Other Ambulatory Visit: Payer: Self-pay | Admitting: Internal Medicine

## 2018-12-29 DIAGNOSIS — R5383 Other fatigue: Secondary | ICD-10-CM

## 2018-12-29 DIAGNOSIS — R899 Unspecified abnormal finding in specimens from other organs, systems and tissues: Secondary | ICD-10-CM

## 2018-12-29 DIAGNOSIS — D649 Anemia, unspecified: Secondary | ICD-10-CM

## 2018-12-29 LAB — FOLATE: Folate: >24.1 ng/mL (ref >5.9)

## 2018-12-29 NOTE — Addendum Note (Signed)
Addended by: Trenda Moots on: [id number] 02:18 PM   Modules accepted: Orders

## 2018-12-29 NOTE — Telephone Encounter (Signed)
Patient is aware 

## 2018-12-29 NOTE — Telephone Encounter (Signed)
Virtual is ok.  

## 2018-12-29 NOTE — Telephone Encounter (Signed)
In office or virtual visit?

## 2018-12-31 LAB — ETHANOL

## 2019-01-02 LAB — VITAMIN B1: Vitamin B1 (Thiamine): 54 nmol/L — ABNORMAL HIGH (ref 8–30)

## 2019-01-11 ENCOUNTER — Encounter: Payer: Self-pay | Admitting: Family Medicine

## 2019-01-11 ENCOUNTER — Other Ambulatory Visit: Payer: Self-pay

## 2019-01-11 ENCOUNTER — Ambulatory Visit (INDEPENDENT_AMBULATORY_CARE_PROVIDER_SITE_OTHER): Payer: Medicare Other | Admitting: Family Medicine

## 2019-01-11 DIAGNOSIS — J3089 Other allergic rhinitis: Secondary | ICD-10-CM

## 2019-01-11 NOTE — Progress Notes (Signed)
Subjective:    Patient ID: Jill Salinas, female    DOB: January 22, 1946, 73 y.o.   MRN: [id number]  HPI Virtual Visit via Video Note  I connected with the patient on 01/11/19 at  3:15 PM EST by a video enabled telemedicine application and verified that I am speaking with the correct person using two identifiers.  Location patient: home Location provider:work or home office Persons participating in the virtual visit: patient, provider  I discussed the limitations of evaluation and management by telemedicine and the availability of in person appointments. The patient expressed understanding and agreed to proceed.   HPI: Here with questions about symptoms that started 2 weeks ago. These include stuffy sinuses, PND, a runny nose, and some swelling of lymph nodes in the neck below the jaws. No fever or headache. No cough or SOB. No loss of taste or smell. No body aches or NVD.    ROS: See pertinent positives and negatives per HPI.  Past Medical History:  Diagnosis Date  . Bowel obstruction ([place])   . Breast cancer ([place])   . Headache(784.0)   . Migraine   . Vision abnormalities    glaucoma    Past Surgical History:  Procedure Laterality Date  . BREAST LUMPECTOMY    . TOTAL ABDOMINAL HYSTERECTOMY N/A     Family History  Problem Relation Age of Onset  . Heart disease Mother   . Headache Mother   . Heart attack Father      Current Outpatient Medications:  .  amitriptyline (ELAVIL) 10 MG tablet, Take 10 mg by mouth at bedtime., Disp: , Rfl:  .  Buprenorphine 15 MCG/HR PTWK, as needed., Disp: , Rfl:  .  butalbital-acetaminophen-caffeine (FIORICET) 50-325-40 MG tablet, Take 1 tablet by mouth every 6 (six) hours as needed for headache. Do not refill in less than 30 days, Disp: 12 tablet, Rfl: 5 .  denosumab (PROLIA) 60 MG/ML SOLN injection, Inject 60 mg into the skin every 6 (six) months. Administer in upper arm, thigh, or abdomen, Disp: , Rfl:  .  diclofenac sodium (VOLTAREN) 1 %  GEL, Apply 2 g topically as needed., Disp: , Rfl:  .  FLUoxetine (PROZAC) 40 MG capsule, Take 40 mg by mouth daily., Disp: , Rfl:  .  folic acid (FOLVITE) 1 MG tablet, Take 1 tablet (1 mg total) by mouth daily., Disp: 90 tablet, Rfl: 3 .  Galcanezumab-gnlm (EMGALITY) 120 MG/ML SOAJ, Inject 240 mg into the skin every 30 (thirty) days., Disp: 2 pen, Rfl: 0 .  Galcanezumab-gnlm (EMGALITY) 120 MG/ML SOAJ, Inject 120 mg into the skin every 30 (thirty) days., Disp: 1 pen, Rfl: 11 .  linaclotide (LINZESS) 290 MCG CAPS capsule, Take 290 mcg by mouth daily before breakfast. , Disp: , Rfl:  .  LUMIGAN 0.01 % SOLN, Place 1 drop into both eyes at bedtime., Disp: , Rfl:  .  magnesium hydroxide (MILK OF MAGNESIA) 400 MG/5ML suspension, Take by mouth daily as needed for mild constipation., Disp: , Rfl:  .  Misc Natural Products (LEG VEIN & CIRCULATION PO), Take by mouth., Disp: , Rfl:  .  modafinil (PROVIGIL) 200 MG tablet, Take 200 mg by mouth daily as needed. , Disp: , Rfl: 1 .  Multiple Vitamins-Minerals (ONE-A-DAY VITACRAVES IMMUNITY PO), Take by mouth., Disp: , Rfl:  .  ondansetron (ZOFRAN) 4 MG tablet, One tab every 8 hours prn.  #20 to last 30 days., Disp: 20 tablet, Rfl: 0 .  oxyCODONE-acetaminophen (PERCOCET) 10-325 MG tablet,  Filled by pain clinic, Disp: , Rfl: 0 .  polyethylene glycol (MIRALAX / GLYCOLAX) packet, Take 17 g by mouth daily., Disp: , Rfl:  .  SUMAtriptan (IMITREX) 100 MG tablet, May repeat in 2 hours if headache persists or recurs. Max 2 tablets in 24 hours., Disp: 9 tablet, Rfl: 6 .  thiamine (VITAMIN B-1) 100 MG tablet, Take 1 tablet (100 mg total) by mouth daily., Disp: 90 tablet, Rfl: 3 .  topiramate (TOPAMAX) 100 MG tablet, Take two tablets at bedtime., Disp: 60 tablet, Rfl: 11 .  tretinoin (RETIN-A) 0.025 % cream, Apply 1 application topically at bedtime. , Disp: , Rfl:  .  triamcinolone cream (KENALOG) 0.1 %, Apply to chin once  at bedtime as needed, Disp: 30 g, Rfl: 0 .  vitamin  B-12 (CYANOCOBALAMIN) 1000 MCG tablet, Take 1,000 mcg by mouth daily., Disp: , Rfl:  .  Vitamin D, Ergocalciferol, (DRISDOL) 50000 units CAPS capsule, Take 50,000 Units by mouth every 7 (seven) days., Disp: , Rfl:  .  zolpidem (AMBIEN) 5 MG tablet, Take 5 mg by mouth at bedtime as needed for sleep., Disp: , Rfl:   EXAM:  VITALS per patient if applicable:  GENERAL: alert, oriented, appears well and in no acute distress  HEENT: atraumatic, conjunttiva clear, no obvious abnormalities on inspection of external nose and ears  NECK: normal movements of the head and neck  LUNGS: on inspection no signs of respiratory distress, breathing rate appears normal, no obvious gross SOB, gasping or wheezing  CV: no obvious cyanosis  MS: moves all visible extremities without noticeable abnormality  PSYCH/NEURO: pleasant and cooperative, no obvious depression or anxiety, speech and thought processing grossly intact  ASSESSMENT AND PLAN: These sound like allergy symptoms. She will try using Claritin and Flonase every day for a few weeks. Recheck as needed.  Alysia Penna, MD  Discussed the following assessment and plan:  No diagnosis found.     I discussed the assessment and treatment plan with the patient. The patient was provided an opportunity to ask questions and all were answered. The patient agreed with the plan and demonstrated an understanding of the instructions.   The patient was advised to call back or seek an in-person evaluation if the symptoms worsen or if the condition fails to improve as anticipated.     Review of Systems     Objective:   Physical Exam        Assessment & Plan:

## 2019-01-18 ENCOUNTER — Telehealth: Payer: Self-pay

## 2019-01-18 NOTE — Telephone Encounter (Signed)
Copied from [place] [id number]. Topic: General - Other >> Jan 18, 2019  2:56 PM Wynetta Emery, [place] C wrote: Reason for CRM: pt called in to request to have X-ray orders and Cardiologist referral placed. Pt would like a call back to discuss further.

## 2019-01-19 NOTE — Telephone Encounter (Signed)
For what reasons does she need an xray or the cards referral?

## 2019-01-20 NOTE — Telephone Encounter (Signed)
Patient decided to schedule a virtual appointment to speak with Dr. Jerilee Hoh. Nothing further needed.

## 2019-01-22 ENCOUNTER — Telehealth: Payer: Self-pay | Admitting: *Deleted

## 2019-01-22 NOTE — Telephone Encounter (Signed)
Copied from [place] [id number]. Topic: General - Other >> Jan 21, 2019  2:06 PM Carolyn Stare wrote: Pt said she thought she rec a message on her phone to change her appt on Tues 12 15 2020, if this is correct please contact pt  Appointment Confirmed with patient.

## 2019-01-26 ENCOUNTER — Telehealth (INDEPENDENT_AMBULATORY_CARE_PROVIDER_SITE_OTHER): Payer: Medicare Other | Admitting: Internal Medicine

## 2019-01-26 ENCOUNTER — Encounter: Payer: Self-pay | Admitting: Internal Medicine

## 2019-01-26 ENCOUNTER — Other Ambulatory Visit: Payer: Self-pay

## 2019-01-26 VITALS — Wt 123.0 lb

## 2019-01-26 DIAGNOSIS — R0782 Intercostal pain: Secondary | ICD-10-CM | POA: Diagnosis not present

## 2019-01-26 NOTE — Progress Notes (Signed)
Virtual Visit via Telephone Note  I connected with Jill Salinas on 01/26/19 at  3:15 PM EST by telephone and verified that I am speaking with the correct person using two identifiers.  We initially attempted to connect via video chat but were unable to due to technical difficulties on the patient's end, so we converted this visit to a phone visit.   I discussed the limitations, risks, security and privacy concerns of performing an evaluation and management service by telephone and the availability of in person appointments. I also discussed with the patient that there may be a patient responsible charge related to this service. The patient expressed understanding and agreed to proceed.  Location patient: home Location provider: work office Participants present for the call: patient, provider Patient did not have a visit in the prior 7 days to address this/these issue(s).   History of Present Illness:  She has scheduled this visit because she is requesting a chest x-ray and a cardiology referral and I wanted to know her reasons why.  She states that back in the summer she was getting an IV infusion for her pain at her pain center in [place] (I was unaware that she was receiving this or attending a pain clinic).  At that time she had an episode of loss of consciousness, is not quite sure what happened but ever since then has been having a "bulge" over her left rib cage with pain.  She thought that this was causing "heart problems" and this is the reason for her requesting a cardiology referral.  She has been very fatigued but denies chest pain other than chest pain with palpation of the lateral rib cage or shortness of breath.   Observations/Objective: Patient sounds cheerful and well on the phone. I do not appreciate any increased work of breathing. Speech and thought processing are grossly intact. Patient reported vitals: None reported   Current Outpatient Medications:  .   amitriptyline (ELAVIL) 10 MG tablet, Take 10 mg by mouth at bedtime., Disp: , Rfl:  .  Buprenorphine 15 MCG/HR PTWK, as needed., Disp: , Rfl:  .  butalbital-acetaminophen-caffeine (FIORICET) 50-325-40 MG tablet, Take 1 tablet by mouth every 6 (six) hours as needed for headache. Do not refill in less than 30 days, Disp: 12 tablet, Rfl: 5 .  denosumab (PROLIA) 60 MG/ML SOLN injection, Inject 60 mg into the skin every 6 (six) months. Administer in upper arm, thigh, or abdomen, Disp: , Rfl:  .  diclofenac sodium (VOLTAREN) 1 % GEL, Apply 2 g topically as needed., Disp: , Rfl:  .  FLUoxetine (PROZAC) 40 MG capsule, Take 40 mg by mouth daily., Disp: , Rfl:  .  folic acid (FOLVITE) 1 MG tablet, Take 1 tablet (1 mg total) by mouth daily., Disp: 90 tablet, Rfl: 3 .  Galcanezumab-gnlm (EMGALITY) 120 MG/ML SOAJ, Inject 120 mg into the skin every 30 (thirty) days., Disp: 1 pen, Rfl: 11 .  linaclotide (LINZESS) 290 MCG CAPS capsule, Take 290 mcg by mouth daily before breakfast. , Disp: , Rfl:  .  LUMIGAN 0.01 % SOLN, Place 1 drop into both eyes at bedtime., Disp: , Rfl:  .  magnesium hydroxide (MILK OF MAGNESIA) 400 MG/5ML suspension, Take by mouth daily as needed for mild constipation., Disp: , Rfl:  .  Misc Natural Products (LEG VEIN & CIRCULATION PO), Take by mouth., Disp: , Rfl:  .  modafinil (PROVIGIL) 200 MG tablet, Take 200 mg by mouth daily as needed. ,  Disp: , Rfl: 1 .  Multiple Vitamins-Minerals (ONE-A-DAY VITACRAVES IMMUNITY PO), Take by mouth., Disp: , Rfl:  .  ondansetron (ZOFRAN) 4 MG tablet, One tab every 8 hours prn.  #20 to last 30 days., Disp: 20 tablet, Rfl: 0 .  oxyCODONE-acetaminophen (PERCOCET) 10-325 MG tablet, Filled by pain clinic, Disp: , Rfl: 0 .  polyethylene glycol (MIRALAX / GLYCOLAX) packet, Take 17 g by mouth daily., Disp: , Rfl:  .  SUMAtriptan (IMITREX) 100 MG tablet, May repeat in 2 hours if headache persists or recurs. Max 2 tablets in 24 hours., Disp: 9 tablet, Rfl: 6 .   thiamine (VITAMIN B-1) 100 MG tablet, Take 1 tablet (100 mg total) by mouth daily., Disp: 90 tablet, Rfl: 3 .  tretinoin (RETIN-A) 0.025 % cream, Apply 1 application topically at bedtime. , Disp: , Rfl:  .  triamcinolone cream (KENALOG) 0.1 %, Apply to chin once  at bedtime as needed, Disp: 30 g, Rfl: 0 .  vitamin B-12 (CYANOCOBALAMIN) 1000 MCG tablet, Take 1,000 mcg by mouth daily., Disp: , Rfl:  .  Vitamin D, Ergocalciferol, (DRISDOL) 50000 units CAPS capsule, Take 50,000 Units by mouth every 7 (seven) days., Disp: , Rfl:  .  zolpidem (AMBIEN) 5 MG tablet, Take 5 mg by mouth at bedtime as needed for sleep., Disp: , Rfl:   Review of Systems:  Constitutional: Denies fever, chills, diaphoresis, appetite change and fatigue.  HEENT: Denies photophobia, eye pain, redness, hearing loss, ear pain, congestion, sore throat, rhinorrhea, sneezing, mouth sores, trouble swallowing, neck pain, neck stiffness and tinnitus.   Respiratory: Denies SOB, DOE, cough, chest tightness,  and wheezing.   Cardiovascular: Denies  palpitations and leg swelling.  Gastrointestinal: Denies nausea, vomiting, abdominal pain, diarrhea, constipation, blood in stool and abdominal distention.  Genitourinary: Denies dysuria, urgency, frequency, hematuria, flank pain and difficulty urinating.  Endocrine: Denies: hot or cold intolerance, sweats, changes in hair or nails, polyuria, polydipsia. Musculoskeletal: Denies myalgias, back pain, joint swelling, arthralgias and gait problem.  Skin: Denies pallor, rash and wound.  Neurological: Denies dizziness, seizures, syncope, weakness, light-headedness, numbness and headaches.  Hematological: Denies adenopathy. Easy bruising, personal or family bleeding history  Psychiatric/Behavioral: Denies suicidal ideation, mood changes, confusion, nervousness, sleep disturbance and agitation   Assessment and Plan:  Intercostal pain  -I suspect she might have suffered a rib fracture or some kind  of trauma during her syncopal episode. -Rib detail chest film today. -Do not believe she needs cardiology referral today. -She will need in person evaluation if symptoms continue.    I discussed the assessment and treatment plan with the patient. The patient was provided an opportunity to ask questions and all were answered. The patient agreed with the plan and demonstrated an understanding of the instructions.   The patient was advised to call back or seek an in-person evaluation if the symptoms worsen or if the condition fails to improve as anticipated.  I provided 13 minutes of non-face-to-face time during this encounter.   Lelon Frohlich, MD [facility] at [facility]

## 2019-01-27 ENCOUNTER — Telehealth: Payer: Self-pay | Admitting: *Deleted

## 2019-01-27 ENCOUNTER — Other Ambulatory Visit: Payer: Self-pay

## 2019-01-27 NOTE — Telephone Encounter (Signed)
Patient rescheduled. Copied from Start [id number]. Topic: Appointment Scheduling - Scheduling Inquiry for Clinic >> Jan 27, 2019 11:43 AM Erick Blinks wrote: Reason for CRM: Pt called and needs to reschedule her appt for Xrays, she is experiencing a emergency (cat is very sick and is being taken to the vet) and would like to be contacted about a new appt. She says you can schedule her and just tell her when to come in. Please advise

## 2019-01-29 ENCOUNTER — Ambulatory Visit (INDEPENDENT_AMBULATORY_CARE_PROVIDER_SITE_OTHER): Payer: Medicare Other

## 2019-01-29 ENCOUNTER — Other Ambulatory Visit: Payer: Self-pay

## 2019-01-29 ENCOUNTER — Other Ambulatory Visit: Payer: Medicare Other

## 2019-01-29 DIAGNOSIS — R0782 Intercostal pain: Secondary | ICD-10-CM

## 2019-01-29 IMAGING — DX DG RIBS W/ CHEST 3+V*L*
3 series · 3 of 3 positions shown · non-contrast
Comparison: [DATE] and prior radiographs

CLINICAL DATA: LEFT chest and rib pain.

EXAM:
LEFT RIBS AND CHEST - 3+ VIEW

[hemithorax (ribs) ap (1 of 2)]
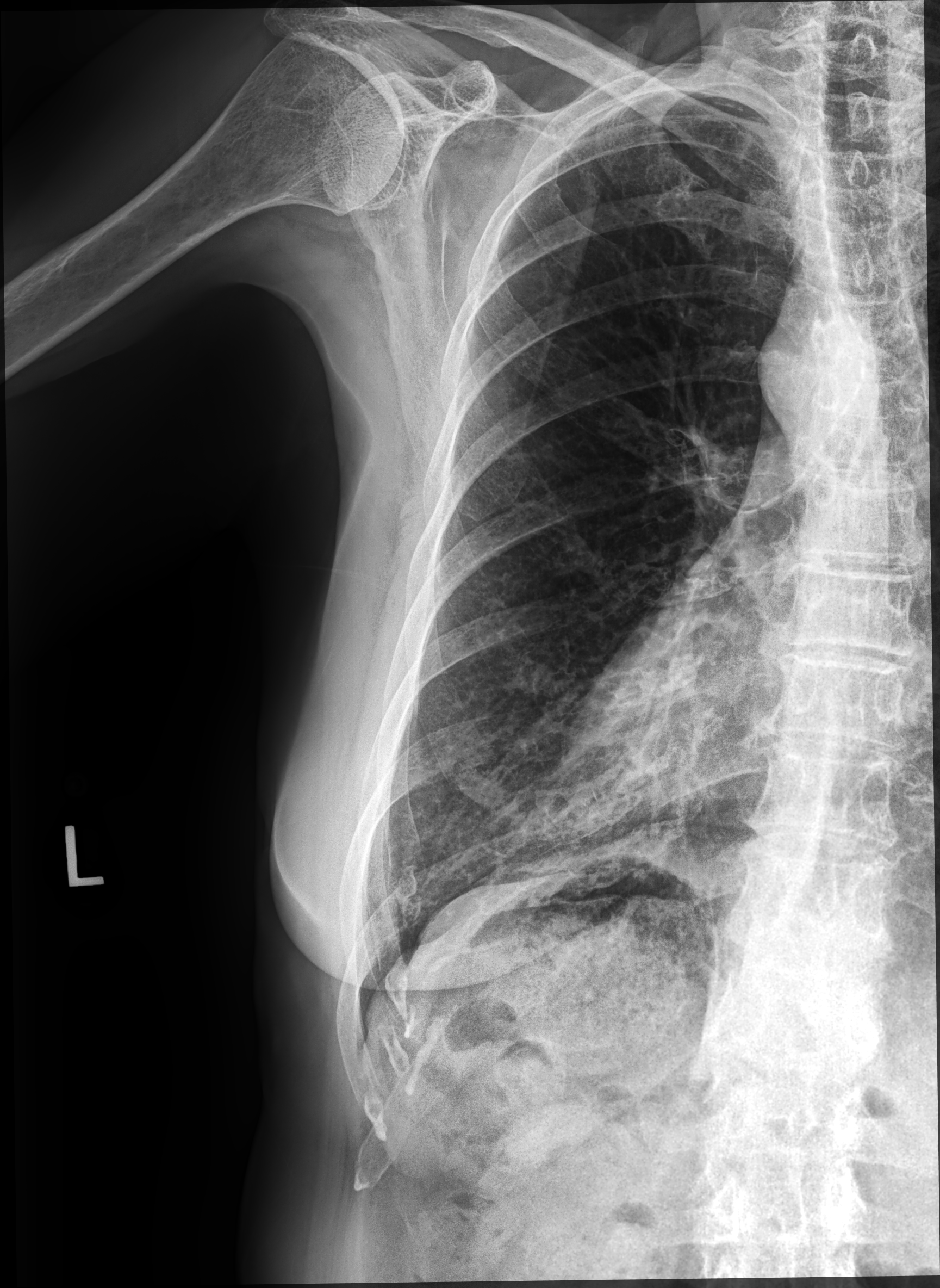

[hemithorax (ribs) ap (2 of 2)]
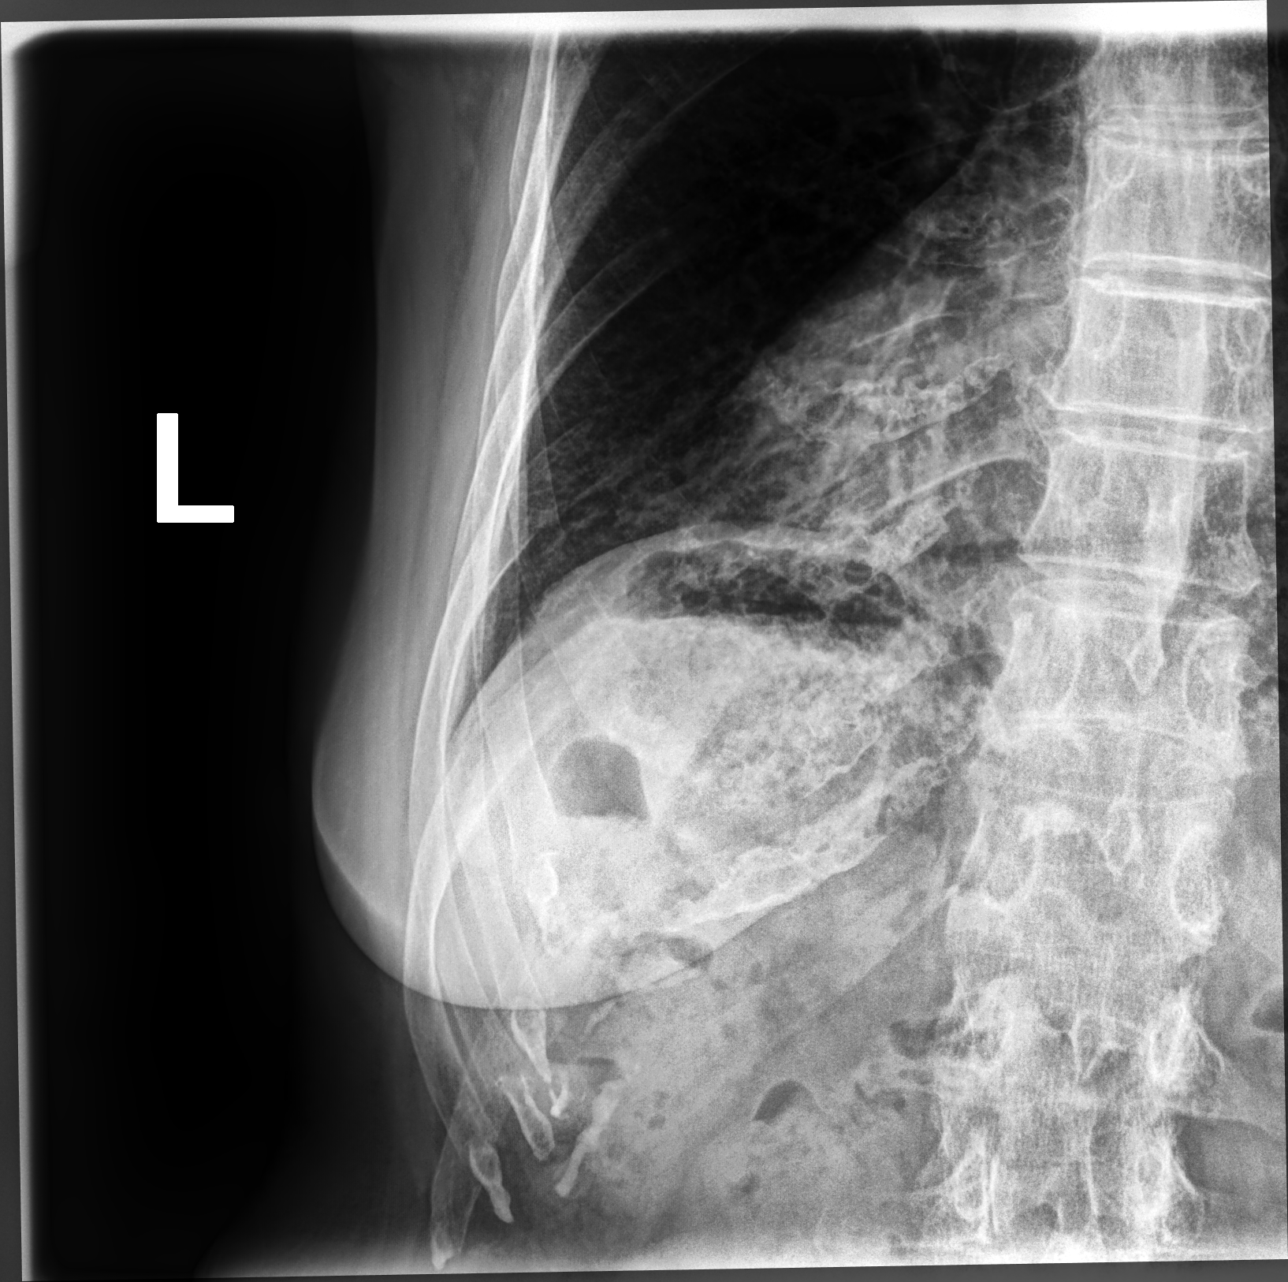

[hemithorax (ribs) mlo]
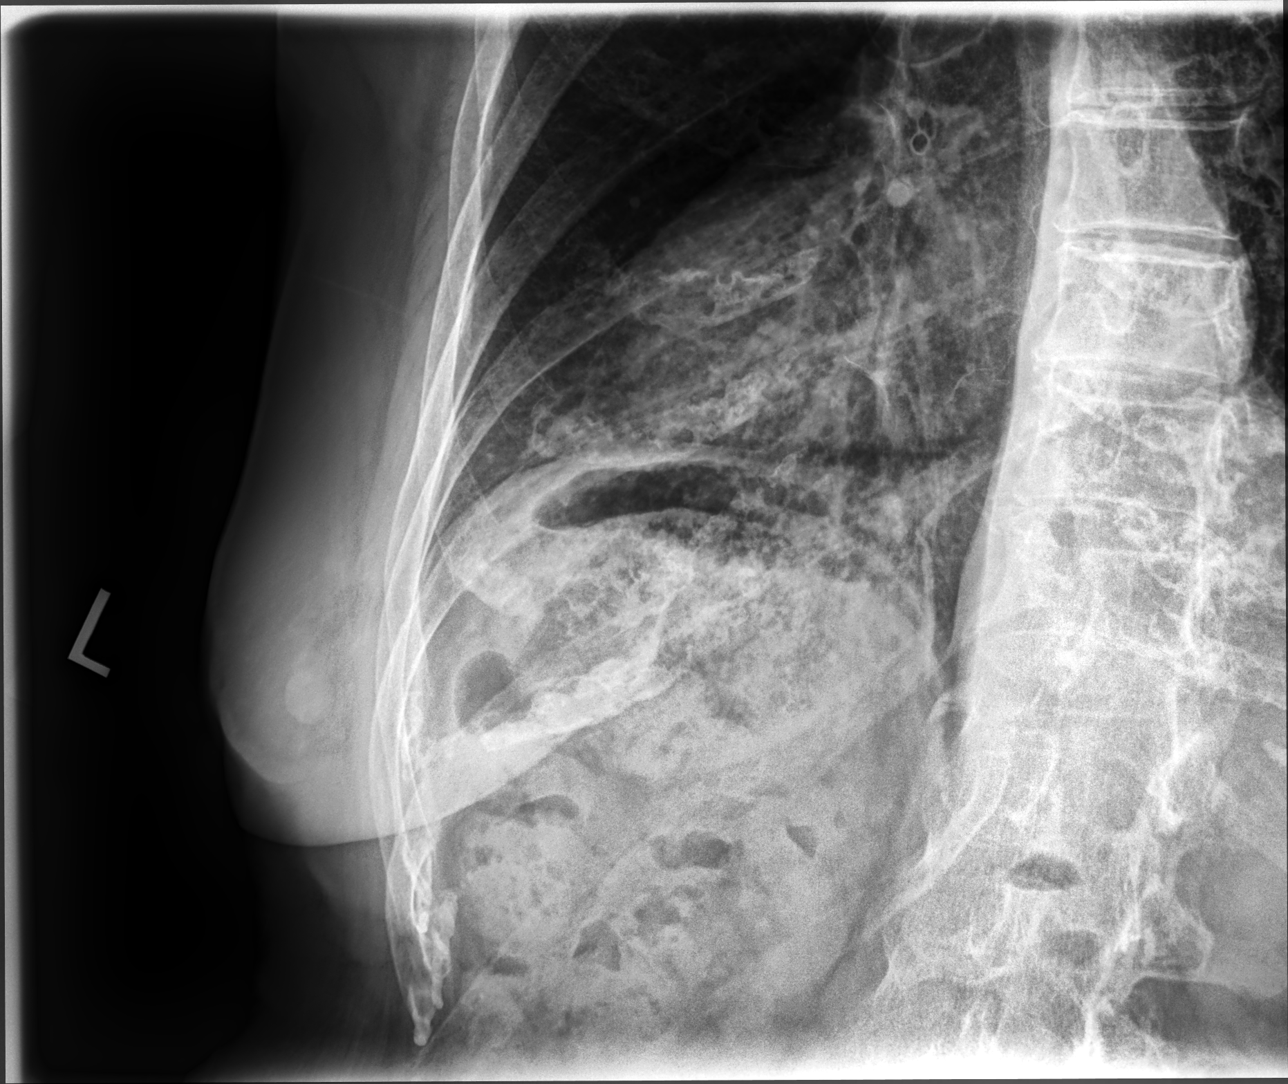

[3 of 3 positions shown; findings below may reference images not displayed]

FINDINGS: No fracture or other bone lesions are seen involving the ribs. There
is no evidence of pneumothorax or pleural effusion. Both lungs are
clear. Heart size and mediastinal contours are within normal limits.
IMPRESSION: Negative.

## 2019-02-11 ENCOUNTER — Encounter: Payer: Self-pay | Admitting: Internal Medicine

## 2019-02-17 ENCOUNTER — Other Ambulatory Visit: Payer: Self-pay

## 2019-02-18 ENCOUNTER — Encounter: Payer: Self-pay | Admitting: Internal Medicine

## 2019-02-18 ENCOUNTER — Ambulatory Visit (INDEPENDENT_AMBULATORY_CARE_PROVIDER_SITE_OTHER): Payer: Medicare Other | Admitting: Internal Medicine

## 2019-02-18 VITALS — BP 110/70 | HR 89 | Temp 97.7°F | Wt 128.4 lb

## 2019-02-18 DIAGNOSIS — G43909 Migraine, unspecified, not intractable, without status migrainosus: Secondary | ICD-10-CM | POA: Diagnosis not present

## 2019-02-18 DIAGNOSIS — E785 Hyperlipidemia, unspecified: Secondary | ICD-10-CM | POA: Diagnosis not present

## 2019-02-18 DIAGNOSIS — R413 Other amnesia: Secondary | ICD-10-CM

## 2019-02-18 DIAGNOSIS — Z23 Encounter for immunization: Secondary | ICD-10-CM

## 2019-02-18 DIAGNOSIS — G894 Chronic pain syndrome: Secondary | ICD-10-CM | POA: Diagnosis not present

## 2019-02-18 DIAGNOSIS — F339 Major depressive disorder, recurrent, unspecified: Secondary | ICD-10-CM

## 2019-02-18 NOTE — Patient Instructions (Addendum)
-  Nice seeing you today!!  -Pneumonia vaccine today.   -Schedule follow up within next 3 months for your physical. Please come in fasting.

## 2019-02-18 NOTE — Addendum Note (Signed)
Addended by: Westley Hummer B on: 02/18/2019 04:44 PM   Modules accepted: Orders

## 2019-02-18 NOTE — Progress Notes (Signed)
Established Patient Office Visit     This visit occurred during the SARS-CoV-2 public health emergency.  Safety protocols were in place, including screening questions prior to the visit, additional usage of staff PPE, and extensive cleaning of exam room while observing appropriate contact time as indicated for disinfecting solutions.    CC/Reason for Visit: Follow-up chronic conditions  HPI: Jill Salinas is a 74 y.o. female who is coming in today for the above mentioned reasons. Past Medical History is significant for: Hyperlipidemia not on medications, history of migraine headaches followed by neurology chronic pain syndrome on oxycodone as prescribed by pain clinic.  She complains of continued pains in her legs.  Etiology of this is uncertain.  Vitamin B12, TSH and vitamin D were recently normal.  She was found to have significant macrocytosis without anemia with normal vitamin B12 and folate level, MCV was 102.5.  She denies alcohol use.  She was found to potentially have a uterine prolapse versus a cervical polyp on her last pelvic exam and she was referred to GYN but she never followed through.  All of her symptoms have improved.  She is requesting Pneumovax today.  She is concerned about her memory loss.   Past Medical/Surgical History: Past Medical History:  Diagnosis Date  . Bowel obstruction ([place])   . Breast cancer ([place])   . Headache(784.0)   . Migraine   . Vision abnormalities    glaucoma    Past Surgical History:  Procedure Laterality Date  . BREAST LUMPECTOMY    . TOTAL ABDOMINAL HYSTERECTOMY N/A     Social History:  reports that she has never smoked. She has never used smokeless tobacco. She reports current alcohol use. She reports that she does not use drugs.  Allergies: Allergies  Allergen Reactions  . Iodinated Diagnostic Agents Other (See Comments)    Tthroat itching Tthroat itching, pt received a 13 hour prep for injection  . Codeine   . Depakote  [Divalproex Sodium] Nausea And Vomiting  . Gabapentin Other (See Comments)    Made her "feel crazy"  . Nortriptyline Other (See Comments)    constipation    Family History:  Family History  Problem Relation Age of Onset  . Heart disease Mother   . Headache Mother   . Heart attack Father      Current Outpatient Medications:  .  amitriptyline (ELAVIL) 10 MG tablet, Take 10 mg by mouth at bedtime., Disp: , Rfl:  .  Buprenorphine 15 MCG/HR PTWK, as needed., Disp: , Rfl:  .  butalbital-acetaminophen-caffeine (FIORICET) 50-325-40 MG tablet, Take 1 tablet by mouth every 6 (six) hours as needed for headache. Do not refill in less than 30 days, Disp: 12 tablet, Rfl: 5 .  denosumab (PROLIA) 60 MG/ML SOLN injection, Inject 60 mg into the skin every 6 (six) months. Administer in upper arm, thigh, or abdomen, Disp: , Rfl:  .  diclofenac sodium (VOLTAREN) 1 % GEL, Apply 2 g topically as needed., Disp: , Rfl:  .  FLUoxetine (PROZAC) 40 MG capsule, Take 40 mg by mouth daily., Disp: , Rfl:  .  folic acid (FOLVITE) 1 MG tablet, Take 1 tablet (1 mg total) by mouth daily., Disp: 90 tablet, Rfl: 3 .  Galcanezumab-gnlm (EMGALITY) 120 MG/ML SOAJ, Inject 120 mg into the skin every 30 (thirty) days., Disp: 1 pen, Rfl: 11 .  linaclotide (LINZESS) 290 MCG CAPS capsule, Take 290 mcg by mouth daily before breakfast. , Disp: , Rfl:  .  LUMIGAN 0.01 % SOLN, Place 1 drop into both eyes at bedtime., Disp: , Rfl:  .  magnesium hydroxide (MILK OF MAGNESIA) 400 MG/5ML suspension, Take by mouth daily as needed for mild constipation., Disp: , Rfl:  .  Misc Natural Products (LEG VEIN & CIRCULATION PO), Take by mouth., Disp: , Rfl:  .  modafinil (PROVIGIL) 200 MG tablet, Take 200 mg by mouth daily as needed. , Disp: , Rfl: 1 .  Multiple Vitamins-Minerals (ONE-A-DAY VITACRAVES IMMUNITY PO), Take by mouth., Disp: , Rfl:  .  ondansetron (ZOFRAN) 4 MG tablet, One tab every 8 hours prn.  #20 to last 30 days., Disp: 20 tablet,  Rfl: 0 .  oxyCODONE-acetaminophen (PERCOCET) 10-325 MG tablet, Filled by pain clinic, Disp: , Rfl: 0 .  polyethylene glycol (MIRALAX / GLYCOLAX) packet, Take 17 g by mouth daily., Disp: , Rfl:  .  SUMAtriptan (IMITREX) 100 MG tablet, May repeat in 2 hours if headache persists or recurs. Max 2 tablets in 24 hours., Disp: 9 tablet, Rfl: 6 .  thiamine (VITAMIN B-1) 100 MG tablet, Take 1 tablet (100 mg total) by mouth daily., Disp: 90 tablet, Rfl: 3 .  tretinoin (RETIN-A) 0.025 % cream, Apply 1 application topically at bedtime. , Disp: , Rfl:  .  triamcinolone cream (KENALOG) 0.1 %, Apply to chin once  at bedtime as needed, Disp: 30 g, Rfl: 0 .  vitamin B-12 (CYANOCOBALAMIN) 1000 MCG tablet, Take 1,000 mcg by mouth daily., Disp: , Rfl:  .  Vitamin D, Ergocalciferol, (DRISDOL) 50000 units CAPS capsule, Take 50,000 Units by mouth every 7 (seven) days., Disp: , Rfl:  .  zolpidem (AMBIEN) 5 MG tablet, Take 5 mg by mouth at bedtime as needed for sleep., Disp: , Rfl:   Review of Systems:  Constitutional: Denies fever, chills, diaphoresis, appetite change and fatigue.  HEENT: Denies photophobia, eye pain, redness, hearing loss, ear pain, congestion, sore throat, rhinorrhea, sneezing, mouth sores, trouble swallowing, neck pain, neck stiffness and tinnitus.   Respiratory: Denies SOB, DOE, cough, chest tightness,  and wheezing.   Cardiovascular: Denies chest pain, palpitations and leg swelling.  Gastrointestinal: Denies nausea, vomiting, abdominal pain, diarrhea, constipation, blood in stool and abdominal distention.  Genitourinary: Denies dysuria, urgency, frequency, hematuria, flank pain and difficulty urinating.  Endocrine: Denies: hot or cold intolerance, sweats, changes in hair or nails, polyuria, polydipsia. Musculoskeletal: Denies myalgias, back pain, joint swelling, arthralgias and gait problem.  Skin: Denies pallor, rash and wound.  Neurological: Denies dizziness, seizures, syncope, weakness,  light-headedness, numbness and headaches.  Hematological: Denies adenopathy. Easy bruising, personal or family bleeding history  Psychiatric/Behavioral: Denies suicidal ideation, mood changes, confusion, nervousness, sleep disturbance and agitation    Physical Exam: Vitals:   02/18/19 1451  BP: 110/70  Pulse: 89  Temp: 97.7 F (36.5 C)  TempSrc: Temporal  SpO2: 95%  Weight: 128 lb 6.4 oz (58.2 kg)    Body mass index is 25.08 kg/m.   Constitutional: NAD, calm, comfortable Eyes: PERRL, lids and conjunctivae normal ENMT: Mucous membranes are moist. Respiratory: clear to auscultation bilaterally, no wheezing, no crackles. Normal respiratory effort. No accessory muscle use.  Cardiovascular: Regular rate and rhythm, no murmurs / rubs / gallops. No extremity edema.  Neurologic: Grossly intact and nonfocal Psychiatric: Normal judgment and insight. Alert and oriented x 3. Normal mood.    Impression and Plan:  Migraine without status migrainosus, not intractable, unspecified migraine type -Followed by neurology.  Depression, recurrent ([place]) -Appears to be well controlled.  Hyperlipidemia,  unspecified hyperlipidemia type -Check lipids when she returns for physical.  Chronic pain syndrome -Followed by pain clinic, is on Percocet 5 times a day.  Memory loss  -Check MMSE with next visit. -Recheck vitamin B12. -Suspect polypharmacy is the main etiology.    Patient Instructions  -Nice seeing you today!!  -Pneumonia vaccine today.   -Schedule follow up within next 3 months for your physical. Please come in fasting.       Lelon Frohlich, MD [facility] at [facility]

## 2019-03-02 ENCOUNTER — Telehealth: Payer: Self-pay | Admitting: *Deleted

## 2019-03-02 DIAGNOSIS — M79604 Pain in right leg: Secondary | ICD-10-CM

## 2019-03-02 NOTE — Telephone Encounter (Signed)
Copied from [place] [id number]. Topic: Referral - Request for Referral >> Mar 02, 2019 12:32 PM Parke Poisson wrote: Has patient seen PCP for this complaint? Yes  Referral for which specialty: Vascular Preferred provider/office: No preference but was given name of Dr Ruta Hinds or Dr Vallarie Mare Reason for referral: Bilateral leg pain. She has has this problem for years but pain has gotten a lot worse

## 2019-03-03 NOTE — Telephone Encounter (Signed)
Ok for referral?

## 2019-03-04 NOTE — Addendum Note (Signed)
Addended by: Westley Hummer B on: 03/04/2019 02:26 PM   Modules accepted: Orders

## 2019-03-04 NOTE — Telephone Encounter (Signed)
Referral placed.

## 2019-03-24 ENCOUNTER — Other Ambulatory Visit: Payer: Self-pay

## 2019-03-24 MED ORDER — BUTALBITAL-APAP-CAFFEINE 50-325-40 MG PO TABS: ORAL_TABLET | ORAL | 5 refills | Status: DC

## 2019-03-24 NOTE — Telephone Encounter (Signed)
Patient called stating she doesn't think the imitix is helping and would like to know if she can be given something else as she still has headaches.

## 2019-03-24 NOTE — Telephone Encounter (Signed)
The patient reports that sumatriptan is no longer helpful. Per Dr. Krista Blue, she may just stop this medication. She may use Fiorcet for acute migraine relief. #12 per 30 days w/ no early refills.  I called [facility] to void the remaining sum[facility]riptan refills. The pharmacist also reported that the previously sent Fioricet prescription (in Oct 2020) had been voided and she will need a new prescription. The patient only had a history of filling it one time.  New rx for Fioricet will be sent to the pharmacy.  The patient verbalized understanding of this treatment plan and was in agreement to the changes.

## 2019-03-30 ENCOUNTER — Telehealth: Payer: Self-pay | Admitting: Internal Medicine

## 2019-03-30 NOTE — Telephone Encounter (Signed)
Okay to refill?  Last filled by Marcial Pacas, MD

## 2019-03-30 NOTE — Telephone Encounter (Signed)
Pt is calling in for a refill linaclotide (LINZESS)  290 MCG and pt is out of it and state that she wants to prevent having a another blockage.  Pharm:  [facility] [address]

## 2019-03-31 MED ORDER — LINACLOTIDE 290 MCG PO CAPS: 290.0000 ug | ORAL_CAPSULE | Freq: Every day | ORAL | 0 refills | Status: DC

## 2019-03-31 NOTE — Telephone Encounter (Signed)
Refill sent.

## 2019-04-28 ENCOUNTER — Telehealth: Payer: Self-pay | Admitting: Internal Medicine

## 2019-04-28 DIAGNOSIS — M8580 Other specified disorders of bone density and structure, unspecified site: Secondary | ICD-10-CM

## 2019-04-28 NOTE — Telephone Encounter (Signed)
Pt is needing a referral to an Endocrinologist- she would like it sent to Dr. Benjiman Core with [facility]. Pt stated her old endocrinologist is in [place] so she wants someone closer and she is due for her bone density test  Pt can be reached at [phone]

## 2019-04-29 ENCOUNTER — Other Ambulatory Visit: Payer: Self-pay

## 2019-04-29 ENCOUNTER — Ambulatory Visit (HOSPITAL_COMMUNITY)
Admission: RE | Admit: 2019-04-29 | Discharge: 2019-04-29 | Disposition: A | Discharge status: Home / Self Care | Payer: Medicare Other | Source: Ambulatory Visit | Attending: Vascular Surgery | Admitting: Vascular Surgery

## 2019-04-29 ENCOUNTER — Encounter: Payer: Self-pay | Admitting: Vascular Surgery

## 2019-04-29 ENCOUNTER — Ambulatory Visit (INDEPENDENT_AMBULATORY_CARE_PROVIDER_SITE_OTHER): Payer: Medicare Other | Admitting: Vascular Surgery

## 2019-04-29 VITALS — BP 127/65 | HR 76 | Temp 97.7°F | Resp 18 | Ht 60.0 in | Wt 128.0 lb

## 2019-04-29 DIAGNOSIS — M79604 Pain in right leg: Secondary | ICD-10-CM | POA: Diagnosis not present

## 2019-04-29 DIAGNOSIS — I739 Peripheral vascular disease, unspecified: Secondary | ICD-10-CM

## 2019-04-29 DIAGNOSIS — M79605 Pain in left leg: Secondary | ICD-10-CM

## 2019-04-29 DIAGNOSIS — I8393 Asymptomatic varicose veins of bilateral lower extremities: Secondary | ICD-10-CM

## 2019-04-29 NOTE — Progress Notes (Signed)
Referring Physician: Dr Domingo Mend  Patient name: Jill Salinas MRN: [id number] DOB: [birth year] Sex: female  REASON FOR CONSULT: Bilateral leg pain  HPI: KINSELY Salinas is a 74 y.o. female, with a 5 to 10-year history of bilateral leg swelling and pain.  States that her legs hurt continuously.  It does not really change whether or not she is walking sitting or standing.  She has worn zipper compression stockings 8 to 15 mm the last few years but not really relieved by this.  She has no prior history of DVT.  She did have a work-up for back pain about 10 years ago and had a few injections done with no relief.  She never had any operations on her back.  She does not really describe claudication.  She currently is receiving ketamine injections for chronic pain.  She also has numbness and tingling in both feet which is chronic.  Other medical problems include migraine headaches which are currently controlled.  Past Medical History:  Diagnosis Date  . Bowel obstruction ([place])   . Breast cancer ([place])   . Headache(784.0)   . Migraine   . Vision abnormalities    glaucoma   Past Surgical History:  Procedure Laterality Date  . BREAST LUMPECTOMY    . TOTAL ABDOMINAL HYSTERECTOMY N/A     Family History  Problem Relation Age of Onset  . Heart disease Mother   . Headache Mother   . Heart attack Father     SOCIAL HISTORY: Social History   Socioeconomic History  . Marital status: Married    Spouse name: Jill Salinas   . Number of children: 0  . Years of education: Assoc  . Highest education level: Not on file  Occupational History  . Not on file  Tobacco Use  . Smoking status: Never Smoker  . Smokeless tobacco: Never Used  Substance and Sexual Activity  . Alcohol use: Yes    Comment: drinks on the weekend  . Drug use: No  . Sexual activity: Not on file  Other Topics Concern  . Not on file  Social History Narrative   Patient lives at home with her husband. Jill Salinas    Patient  has an Geophysicist/field seismologist.    Patient has no children.    Patient is right handed.    Social Determinants of Health   Financial Resource Strain:   . Difficulty of Paying Living Expenses:   Food Insecurity:   . Worried About Charity fundraiser in the Last Year:   . Arboriculturist in the Last Year:   Transportation Needs:   . Film/video editor (Medical):   Marland Kitchen Lack of Transportation (Non-Medical):   Physical Activity:   . Days of Exercise per Week:   . Minutes of Exercise per Session:   Stress:   . Feeling of Stress :   Social Connections:   . Frequency of Communication with Friends and Family:   . Frequency of Social Gatherings with Friends and Family:   . Attends Religious Services:   . Active Member of Clubs or Organizations:   . Attends Archivist Meetings:   Marland Kitchen Marital Status:   Intimate Partner Violence:   . Fear of Current or Ex-Partner:   . Emotionally Abused:   Marland Kitchen Physically Abused:   . Sexually Abused:     Allergies  Allergen Reactions  . Iodinated Diagnostic Agents Other (See Comments)    Tthroat itching Tthroat itching, pt received  a 13 hour prep for injection  . Codeine   . Depakote [Divalproex Sodium] Nausea And Vomiting  . Gabapentin Other (See Comments)    Made her "feel crazy"  . Nortriptyline Other (See Comments)    constipation    Current Outpatient Medications  Medication Sig Dispense Refill  . amitriptyline (ELAVIL) 10 MG tablet Take 10 mg by mouth at bedtime.    . Buprenorphine 15 MCG/HR PTWK as needed.    . butalbital-acetaminophen-caffeine (FIORICET) 50-325-40 MG tablet Take one tablet daily as needed for acute migraine. 12 tablets must last 30 days. No early refills allowed. 12 tablet 5  . cyanocobalamin (,VITAMIN B-12,) 1000 MCG/ML injection ADMINISTER 1 ML (1,000 MCG) INTRAMUSCULARLY MONTHLY  10 ML BOTTLE    . denosumab (PROLIA) 60 MG/ML SOLN injection Inject 60 mg into the skin every 6 (six) months. Administer in upper arm,  thigh, or abdomen    . diclofenac sodium (VOLTAREN) 1 % GEL Apply 2 g topically as needed.    . DULoxetine (CYMBALTA) 60 MG capsule Take 60 mg by mouth daily.    Marland Kitchen FLUoxetine (PROZAC) 40 MG capsule Take 40 mg by mouth daily.    . folic acid (FOLVITE) 1 MG tablet Take 1 tablet (1 mg total) by mouth daily. 90 tablet 3  . Galcanezumab-gnlm (EMGALITY) 120 MG/ML SOAJ Inject 120 mg into the skin every 30 (thirty) days. 1 pen 11  . linaclotide (LINZESS) 290 MCG CAPS capsule Take 1 capsule (290 mcg total) by mouth daily before breakfast. 90 capsule 0  . LUMIGAN 0.01 % SOLN Place 1 drop into both eyes at bedtime.    . magnesium hydroxide (MILK OF MAGNESIA) 400 MG/5ML suspension Take by mouth daily as needed for mild constipation.    . Misc Natural Products (LEG VEIN & CIRCULATION PO) Take by mouth.    . modafinil (PROVIGIL) 200 MG tablet Take 200 mg by mouth daily as needed.   1  . Multiple Vitamins-Minerals (ONE-A-DAY VITACRAVES IMMUNITY PO) Take by mouth.    . ondansetron (ZOFRAN) 4 MG tablet One tab every 8 hours prn.  #20 to last 30 days. 20 tablet 0  . oxyCODONE-acetaminophen (PERCOCET) 10-325 MG tablet Filled by pain clinic  0  . polyethylene glycol (MIRALAX / GLYCOLAX) packet Take 17 g by mouth daily.    . SUMAtriptan (IMITREX) 100 MG tablet May repeat in 2 hours if headache persists or recurs. Max 2 tablets in 24 hours. 9 tablet 6  . thiamine (VITAMIN B-1) 100 MG tablet Take 1 tablet (100 mg total) by mouth daily. 90 tablet 3  . tretinoin (RETIN-A) 0.025 % cream Apply 1 application topically at bedtime.     . triamcinolone cream (KENALOG) 0.1 % Apply to chin once  at bedtime as needed 30 g 0  . vitamin B-12 (CYANOCOBALAMIN) 1000 MCG tablet Take 1,000 mcg by mouth daily.    . Vitamin D, Ergocalciferol, (DRISDOL) 50000 units CAPS capsule Take 50,000 Units by mouth every 7 (seven) days.    Marland Kitchen zolpidem (AMBIEN) 5 MG tablet Take 5 mg by mouth at bedtime as needed for sleep.     No current  facility-administered medications for this visit.    ROS:   General:  No weight loss, Fever, chills  HEENT: No recent headaches, no nasal bleeding, no visual changes, no sore throat  Neurologic: No dizziness, blackouts, seizures. No recent symptoms of stroke or mini- stroke. No recent episodes of slurred speech, or temporary blindness.  Cardiac: No recent  episodes of chest pain/pressure, no shortness of breath at rest.  No shortness of breath with exertion.  Denies history of atrial fibrillation or irregular heartbeat  Vascular: No history of rest pain in feet.  No history of claudication.  No history of non-healing ulcer, No history of DVT   Pulmonary: No home oxygen, no productive cough, no hemoptysis,  No asthma or wheezing  Musculoskeletal:  [ ]  Arthritis, [X]  Low back pain,  [ ]  Joint pain  Hematologic:No history of hypercoagulable state.  No history of easy bleeding.  No history of anemia  Gastrointestinal: No hematochezia or melena,  No gastroesophageal reflux, no trouble swallowing  Urinary: [ ]  chronic Kidney disease, [ ]  on HD - [ ]  MWF or [ ]  TTHS, [ ]  Burning with urination, [ ]  Frequent urination, [ ]  Difficulty urinating;   Skin: No rashes  Psychological: No history of anxiety,  No history of depression   Physical Examination  Vitals:   04/29/19 1434  BP: 127/65  Pulse: 76  Resp: 18  Temp: 97.7 F (36.5 C)  SpO2: 100%  Weight: 128 lb (58.1 kg)  Height: 5' (1.524 m)    Body mass index is 25 kg/m.  General:  Alert and oriented, no acute distress HEENT: Normal Neck: No JVD Cardiac: Regular Rate and Rhythm Abdomen: Soft, non-tender, non-distended, no mass Skin: No rash Extremity Pulses:  2+ radial, brachial, femoral, dorsalis pedis, posterior tibial pulses bilaterally Musculoskeletal: No deformity trace bilateral pretibial edema  Neurologic: Upper and lower extremity motor 5/5 and symmetric  DATA:  Patient had bilateral ABIs performed today which  were triphasic greater than 1 bilaterally  ASSESSMENT: Patient with chronic lower extremity pain probably multifactorial in nature.  No evidence of arterial occlusive disease.  She may have an element of venous reflux.  However all of her aches and pains are probably not explainable through one etiology.  Patient was reassured today that if she does have venous reflux this can be a nuisance problem but again was probably not the one single diagnosis related to her leg pain.  She was reassured that she is not at risk of limb loss.   PLAN: Patient was fitted for knee-high 25 to 30 mm compression stockings today rather than zip up compression that she has been using.  Hopefully this will at least give her some symptomatic relief.  If she does not improve with her compression stockings we could see her back again in 6 months time and do a venous duplex exam for reflux.  However, I believe that again her constellation of symptoms is probably multifactorial and an intervention for her veins is probably not going to be the solution for all of this.   Ruta Hinds, MD [facility] Office: [phone] Pager: [phone]

## 2019-04-30 NOTE — Telephone Encounter (Signed)
Ok for referral?

## 2019-04-30 NOTE — Telephone Encounter (Signed)
Referral done

## 2019-05-20 ENCOUNTER — Ambulatory Visit: Payer: Medicare Other | Admitting: Internal Medicine

## 2019-05-26 ENCOUNTER — Telehealth: Payer: Self-pay | Admitting: Neurology

## 2019-05-26 ENCOUNTER — Other Ambulatory Visit: Payer: Self-pay

## 2019-05-26 ENCOUNTER — Ambulatory Visit: Payer: Medicare Other | Admitting: Internal Medicine

## 2019-05-26 DIAGNOSIS — Z0289 Encounter for other administrative examinations: Secondary | ICD-10-CM

## 2019-05-26 NOTE — Telephone Encounter (Signed)
PA initiated through covermymeds (key: [id number]). Coverage with [employer] (ph: [phone]). Pt [id number]. Decision pending.  Pt has been updated with this information.

## 2019-05-26 NOTE — Telephone Encounter (Signed)
Pt states that she was informed by her pharmacy that emgality is being changed to another medication and would like to verify if the new medication is safe for her take. (pt is unsure of new medication name)

## 2019-05-26 NOTE — Telephone Encounter (Signed)
PA approved from 05/26/19 until further notice. Pharmacy has been notified and will prepare the prescription for pick up. They will call patient when ready.

## 2019-06-07 ENCOUNTER — Other Ambulatory Visit: Payer: Self-pay

## 2019-06-07 LAB — HM DEXA SCAN

## 2019-06-08 ENCOUNTER — Telehealth (INDEPENDENT_AMBULATORY_CARE_PROVIDER_SITE_OTHER): Payer: Medicare Other | Admitting: Internal Medicine

## 2019-06-08 ENCOUNTER — Telehealth: Payer: Self-pay | Admitting: Neurology

## 2019-06-08 DIAGNOSIS — R600 Localized edema: Secondary | ICD-10-CM | POA: Diagnosis not present

## 2019-06-08 DIAGNOSIS — G8929 Other chronic pain: Secondary | ICD-10-CM

## 2019-06-08 DIAGNOSIS — M545 Low back pain, unspecified: Secondary | ICD-10-CM

## 2019-06-08 NOTE — Progress Notes (Signed)
Virtual Visit via Telephone Note  I connected with Jill Salinas on 06/08/19 at  3:00 PM EDT by telephone and verified that I am speaking with the correct person using two identifiers.   I discussed the limitations, risks, security and privacy concerns of performing an evaluation and management service by telephone and the availability of in person appointments. I also discussed with the patient that there may be a patient responsible charge related to this service. The patient expressed understanding and agreed to proceed.  Location patient: home Location provider: work office Participants present for the call: patient, provider Patient did not have a visit in the prior 7 days to address this/these issue(s).   History of Present Illness:  Jill Salinas has scheduled this visit to discuss several acute concerns:  1.  She has been bumping into things and under the bruises has developed "knots".  2.  Her old aches and pains including of her rib cage and back are still bothering her.  She is on oxycodone through a pain management clinic.  3.  She has been having increased lower extremity swelling.  She saw a vascular specialist who thought it was venous insufficiency and recommended compression stockings which she wears less than 50% of the time.  4.  She wonders if it is okay to continue to take Nexium for her GERD as her neighbor said "that it causes more harm than good".  5.  She has been biting her lip at night and wonders if this is a problem.     Observations/Objective: Patient sounds cheerful and well on the phone. I do not appreciate any increased work of breathing. Speech and thought processing are grossly intact.  She appears forgetful. Patient reported vitals: None reported   Current Outpatient Medications:  .  amitriptyline (ELAVIL) 10 MG tablet, Take 10 mg by mouth at bedtime., Disp: , Rfl:  .  Buprenorphine 15 MCG/HR PTWK, as needed., Disp: , Rfl:  .   butalbital-acetaminophen-caffeine (FIORICET) 50-325-40 MG tablet, Take one tablet daily as needed for acute migraine. 12 tablets must last 30 days. No early refills allowed., Disp: 12 tablet, Rfl: 5 .  cyanocobalamin (,VITAMIN B-12,) 1000 MCG/ML injection, ADMINISTER 1 ML (1,000 MCG) INTRAMUSCULARLY MONTHLY  10 ML BOTTLE, Disp: , Rfl:  .  denosumab (PROLIA) 60 MG/ML SOLN injection, Inject 60 mg into the skin every 6 (six) months. Administer in upper arm, thigh, or abdomen, Disp: , Rfl:  .  diclofenac sodium (VOLTAREN) 1 % GEL, Apply 2 g topically as needed., Disp: , Rfl:  .  DULoxetine (CYMBALTA) 60 MG capsule, Take 60 mg by mouth daily., Disp: , Rfl:  .  FLUoxetine (PROZAC) 40 MG capsule, Take 40 mg by mouth daily., Disp: , Rfl:  .  folic acid (FOLVITE) 1 MG tablet, Take 1 tablet (1 mg total) by mouth daily., Disp: 90 tablet, Rfl: 3 .  Galcanezumab-gnlm (EMGALITY) 120 MG/ML SOAJ, Inject 120 mg into the skin every 30 (thirty) days., Disp: 1 pen, Rfl: 11 .  linaclotide (LINZESS) 290 MCG CAPS capsule, Take 1 capsule (290 mcg total) by mouth daily before breakfast., Disp: 90 capsule, Rfl: 0 .  LUMIGAN 0.01 % SOLN, Place 1 drop into both eyes at bedtime., Disp: , Rfl:  .  magnesium hydroxide (MILK OF MAGNESIA) 400 MG/5ML suspension, Take by mouth daily as needed for mild constipation., Disp: , Rfl:  .  Misc Natural Products (LEG VEIN & CIRCULATION PO), Take by mouth., Disp: , Rfl:  .  modafinil (PROVIGIL) 200 MG tablet, Take 200 mg by mouth daily as needed. , Disp: , Rfl: 1 .  Multiple Vitamins-Minerals (ONE-A-DAY VITACRAVES IMMUNITY PO), Take by mouth., Disp: , Rfl:  .  ondansetron (ZOFRAN) 4 MG tablet, One tab every 8 hours prn.  #20 to last 30 days., Disp: 20 tablet, Rfl: 0 .  oxyCODONE-acetaminophen (PERCOCET) 10-325 MG tablet, Filled by pain clinic, Disp: , Rfl: 0 .  polyethylene glycol (MIRALAX / GLYCOLAX) packet, Take 17 g by mouth daily., Disp: , Rfl:  .  SUMAtriptan (IMITREX) 100 MG tablet, May  repeat in 2 hours if headache persists or recurs. Max 2 tablets in 24 hours., Disp: 9 tablet, Rfl: 6 .  thiamine (VITAMIN B-1) 100 MG tablet, Take 1 tablet (100 mg total) by mouth daily., Disp: 90 tablet, Rfl: 3 .  tretinoin (RETIN-A) 0.025 % cream, Apply 1 application topically at bedtime. , Disp: , Rfl:  .  triamcinolone cream (KENALOG) 0.1 %, Apply to chin once  at bedtime as needed, Disp: 30 g, Rfl: 0 .  vitamin B-12 (CYANOCOBALAMIN) 1000 MCG tablet, Take 1,000 mcg by mouth daily., Disp: , Rfl:  .  Vitamin D, Ergocalciferol, (DRISDOL) 50000 units CAPS capsule, Take 50,000 Units by mouth every 7 (seven) days., Disp: , Rfl:  .  zolpidem (AMBIEN) 5 MG tablet, Take 5 mg by mouth at bedtime as needed for sleep., Disp: , Rfl:   Review of Systems:  Constitutional: Denies fever, chills, diaphoresis, appetite change. HEENT: Denies photophobia, eye pain, redness, hearing loss, ear pain, congestion, sore throat, rhinorrhea, sneezing, mouth sores, trouble swallowing, neck pain, neck stiffness and tinnitus.   Respiratory: Denies SOB, DOE, cough, chest tightness,  and wheezing.   Cardiovascular: Denies chest pain, palpitations and leg swelling.  Gastrointestinal: Denies nausea, vomiting, abdominal pain, diarrhea, constipation, blood in stool and abdominal distention.  Genitourinary: Denies dysuria, urgency, frequency, hematuria, flank pain and difficulty urinating.  Endocrine: Denies: hot or cold intolerance, sweats, changes in hair or nails, polyuria, polydipsia. Musculoskeletal: Denies joint swelling and gait problem.  Skin: Denies pallor, rash and wound.  Neurological: Denies dizziness, seizures, syncope, weakness, light-headedness, numbness and headaches.  Hematological: Denies adenopathy. Easy bruising, personal or family bleeding history  Psychiatric/Behavioral: Denies suicidal ideation, mood changes, confusion, nervousness, sleep disturbance and agitation   Assessment and Plan:  Chronic  left-sided low back pain, unspecified whether sciatica present Bilateral leg edema GERD  -Okay to continue Nexium, advised to continue to wear compression stockings.  Suspect that the lumps under her bruises are probably low-grade hematomas. -I will have her schedule an appointment for a physical to obtain lab work given her multiple somatic complaints.    I discussed the assessment and treatment plan with the patient. The patient was provided an opportunity to ask questions and all were answered. The patient agreed with the plan and demonstrated an understanding of the instructions.   The patient was advised to call back or seek an in-person evaluation if the symptoms worsen or if the condition fails to improve as anticipated.  I provided 23 minutes of non-face-to-face time during this encounter.   Lelon Frohlich, MD [facility]

## 2019-06-08 NOTE — Telephone Encounter (Signed)
Pt has called to report that she has had a headache for the last 2 days and she would like something called in for when the headache breaks thru.  Pt states what has been tried previously hasn't really helped and she would like if something else could be called in.  Pt also asked that it be noted that her headaches are normally on the right side, this time (for the last 2 days) it has been on her left side.  Pt is still using [facility] #[id number].  Pt asking that her husband's cell# be called if she is not available on hers, his # is [phone]

## 2019-06-08 NOTE — Telephone Encounter (Signed)
I spoke to the patient. She has noticed an increase in headaches and would like to come in to discuss medication changes. She has tried and failed many medications in the past (topamax, Trokendi, gabapentin, amitriptyline, nortriptyline, nortriptyline, ibuprofen, naproxen, fioricet, duloxetine, venlafaxine, divalproex, fluoxetine, diclofenac, cyclobenzaprine, sumatriptan. Aimovig - not an option due to chronic constipation).  She has been scheduled w/ Amy Lomax on 06/10/19 for follow up since she has not been seen since last September.

## 2019-06-08 NOTE — Telephone Encounter (Signed)
Thank you. I will be looking for her.

## 2019-06-09 ENCOUNTER — Encounter: Payer: Self-pay | Admitting: Internal Medicine

## 2019-06-09 ENCOUNTER — Other Ambulatory Visit: Payer: Self-pay | Admitting: Internal Medicine

## 2019-06-09 DIAGNOSIS — L309 Dermatitis, unspecified: Secondary | ICD-10-CM

## 2019-06-09 NOTE — Progress Notes (Signed)
PATIENT: Jill Salinas DOB: 1945-11-02  REASON FOR VISIT: follow up HISTORY FROM: patient  Chief Complaint  Patient presents with  . Follow-up    Rm 2, alone.  migraine, l sided since last month even with emagality.  took amitriptyline and imitrex off med list as she stated not taking.  . Migraine     HISTORY OF PRESENT ILLNESS: Today 06/10/19 Jill Salinas is a 74 y.o. female here today for follow up for chronic migraines. She was previously taking Topamax 200mg  at bedtime, Emgality and was started on propranolol LA 60mg  daily in 10/2018. Today, she reports that she is no longer taking topiramate and doesn't think she started propranolol. She is quite unclear about why. She thinks she threw the propranolol away because she wasn't sure what it was for. Per telephone note on 10/16, patient had called with concerns of continuing topiramate as it was a "seizure" medication. She was advised to wean topiramate and start nortriptyline. She states that nortriptyline " was a terrible drug". No clear side effects other than she did not like it. Per Dr Rhea Belton last follow up note, she was using Imitrex for abortive therapy but when questioned, she doesn't think she has been taking this. She reports using Aleve for migraine management. She is unable to quantify how much Aleve she is taking. She is followed regularly by pain management on on multiple medications for chronic pain including buprenorphine patch, oxycodone 10-325mg  five times daily. She is on duloxetine and fluoxetine for mood and pain management.  She does not sleep well and takes Ambien every night. She is taking modafinil during the day for daytime sleepiness.   HISTORY: (copied from Dr Rhea Belton note on 11/09/2018)  She has migraines for many many years currently well controlled on 200 mg of Topamax at night. She also takes gabapentin 900mg  daily, which has helped her migraines as well. She uses Imitrex acutely both the injectable and the oral  preparation. She thinks her headaches are in good control. She used imitrex po first, if that does not take care of her headaches, she would use injections.  This an early visit for her complains of 2 months history of right side neck pain, right occipital area pain, it bothers her 2/week, lasting all day, she felt spacy dealing with the pain, she does not like to drive,  She also had trouble walking, do not have the strength to get up an even steps, xone year, she has no incontinence, more so on the left leg, no involvement of left arm, she also has left knee swelling, felt so bad. She had 30 Lb weight gain over one year "can not get into any of my cloth" She also complains of low back pain, radiating pain to her left leg, has tried physical therapy recently, which has been helpful, no incontinence.   UPDATE June 24th 2015:YY EMG/NCS in June 24th 2015 showed no evidence of left lumbar radiculopathy, there was evidence of right triceps irritation, but no neuropathic changes at other selected right upper extremity needle examination.  We have reviewed MRI lumbar film together, only mild degenerative disc disease, there was no significant foraminal, or canal stenosis   She is taking Cymbalta now, which has helped her some, but continued complaint bilateral lower extremity deep achy pain, especially at nighttime, she no longer has shooting pain from her neck to her shoulders, or arms, but she complains of deep achy pain in her midline upper cervical region,  She also complains fatigued easily, lack of stamina She had left lumbar epidural injection, by interventional radiologist Dr. Lawrence Santiago at left L4-5 space, in August 24th 2015, she only had transient improvement,  Now she complains of left lower extremity pain, bilateral lower extremity deep achy pain, feet paresthesia, gait difficulty,  UPDATE April 25th 2016:YY She complains of two months history of neck pain, spreading forward to  bilateral retrorbital area, daily, can go up to 10/10, radiating to her right shoulder, woke up at night with headaches,has been using frequent Imitrex tablet, sometimes injection  She has history of migraine all her life, previous headaches is lateralized severe pounding headache, now it is at her neck, She tries to sleep different ways,Change pillows without helping  She is under pain management for her low back pain,is taking daily hydrocodone, tried Elavil before, complains of dizziness, weight gain.  Update July third 2017:YY She has severe constipation, has not used bathroom for 3 months, She complains of bloating, tried different methods, medications, abdomen massage,  Her migraine has much improved, she has migraines headaches every few times, she is taking imitrex as needed, which has been helpful she is worried about side effect of Trokendi xr, I will decrease the dosage from 200mg  to 100mg  qhs.  UPDATE May 12 2017: Today she coming with different complaints, complains of frequent awakening at nighttime, drenching sweats, pain in her legs, difficulty falling to sleep, chronic constipation,  Laboratory evaluations in October 2017 showed mild anemia hemoglobin of 12.5, normal CMP,  MRI of cervical and lumbar spine 2015 showed degenerative changes, but there was no significant canal or foraminal narrowing.  Update November 09, 2018: She complains of depression, poor appetite, diffuse body achy pain, frequent almost daily headaches, despite polypharmacy treatment, including Prozac 40 mg daily, Topamax 100 mg 2 tablets at bedtime, Imitrex as needed is helpful most of the time   REVIEW OF SYSTEMS: Out of a complete 14 system review of symptoms, the patient complains only of the following symptoms, headaches, chronic pain and all other reviewed systems are negative.  ALLERGIES: Allergies  Allergen Reactions  . Iodinated Diagnostic Agents Other (See Comments)    Tthroat  itching Tthroat itching, pt received a 13 hour prep for injection  . Codeine   . Depakote [Divalproex Sodium] Nausea And Vomiting  . Gabapentin Other (See Comments)    Made her "feel crazy"  . Nortriptyline Other (See Comments)    constipation    HOME MEDICATIONS: Outpatient Medications Prior to Visit  Medication Sig Dispense Refill  . Buprenorphine 15 MCG/HR PTWK as needed.    . butalbital-acetaminophen-caffeine (FIORICET) 50-325-40 MG tablet Take one tablet daily as needed for acute migraine. 12 tablets must last 30 days. No early refills allowed. 12 tablet 5  . cyanocobalamin (,VITAMIN B-12,) 1000 MCG/ML injection ADMINISTER 1 ML (1,000 MCG) INTRAMUSCULARLY MONTHLY  10 ML BOTTLE    . diclofenac sodium (VOLTAREN) 1 % GEL Apply 2 g topically as needed.    . DULoxetine (CYMBALTA) 60 MG capsule Take 60 mg by mouth daily.    Marland Kitchen FLUoxetine (PROZAC) 40 MG capsule Take 40 mg by mouth daily.    . folic acid (FOLVITE) 1 MG tablet Take 1 tablet (1 mg total) by mouth daily. 90 tablet 3  . Galcanezumab-gnlm (EMGALITY) 120 MG/ML SOAJ Inject 120 mg into the skin every 30 (thirty) days. 1 pen 11  . linaclotide (LINZESS) 290 MCG CAPS capsule Take 1 capsule (290 mcg total) by mouth daily before  breakfast. 90 capsule 0  . LUMIGAN 0.01 % SOLN Place 1 drop into both eyes at bedtime.    . Misc Natural Products (LEG VEIN & CIRCULATION PO) Take by mouth.    . modafinil (PROVIGIL) 200 MG tablet Take 200 mg by mouth daily as needed.   1  . Multiple Vitamins-Minerals (ONE-A-DAY VITACRAVES IMMUNITY PO) Take by mouth.    . ondansetron (ZOFRAN) 4 MG tablet One tab every 8 hours prn.  #20 to last 30 days. 20 tablet 0  . oxyCODONE-acetaminophen (PERCOCET) 10-325 MG tablet Filled by pain clinic  0  . polyethylene glycol (MIRALAX / GLYCOLAX) packet Take 17 g by mouth daily.    Marland Kitchen thiamine (VITAMIN B-1) 100 MG tablet Take 1 tablet (100 mg total) by mouth daily. 90 tablet 3  . tretinoin (RETIN-A) 0.025 % cream Apply 1  application topically at bedtime.     . triamcinolone cream (KENALOG) 0.1 % APPLY TO CHIN DAILY AT BEDTIME AS NEEDED 30 g 0  . vitamin B-12 (CYANOCOBALAMIN) 1000 MCG tablet Take 1,000 mcg by mouth daily.    Marland Kitchen zolpidem (AMBIEN) 5 MG tablet Take 5 mg by mouth at bedtime as needed for sleep.    Marland Kitchen denosumab (PROLIA) 60 MG/ML SOLN injection Inject 60 mg into the skin every 6 (six) months. Administer in upper arm, thigh, or abdomen    . amitriptyline (ELAVIL) 10 MG tablet Take 10 mg by mouth at bedtime.    . magnesium hydroxide (MILK OF MAGNESIA) 400 MG/5ML suspension Take by mouth daily as needed for mild constipation.    . SUMAtriptan (IMITREX) 100 MG tablet May repeat in 2 hours if headache persists or recurs. Max 2 tablets in 24 hours. 9 tablet 6  . Vitamin D, Ergocalciferol, (DRISDOL) 50000 units CAPS capsule Take 50,000 Units by mouth every 7 (seven) days.     No facility-administered medications prior to visit.    PAST MEDICAL HISTORY: Past Medical History:  Diagnosis Date  . Bowel obstruction ([place])   . Breast cancer ([place])   . Headache(784.0)   . Migraine   . Vision abnormalities    glaucoma    PAST SURGICAL HISTORY: Past Surgical History:  Procedure Laterality Date  . BREAST LUMPECTOMY    . TOTAL ABDOMINAL HYSTERECTOMY N/A     FAMILY HISTORY: Family History  Problem Relation Age of Onset  . Heart disease Mother   . Headache Mother   . Heart attack Father     SOCIAL HISTORY: Social History   Socioeconomic History  . Marital status: Married    Spouse name: Gwyndolyn Saxon   . Number of children: 0  . Years of education: Assoc  . Highest education level: Not on file  Occupational History  . Not on file  Tobacco Use  . Smoking status: Never Smoker  . Smokeless tobacco: Never Used  Substance and Sexual Activity  . Alcohol use: Yes    Comment: drinks on the weekend  . Drug use: No  . Sexual activity: Not on file  Other Topics Concern  . Not on file  Social History  Narrative   Patient lives at home with her husband. Gwyndolyn Saxon    Patient has an Geophysicist/field seismologist.    Patient has no children.    Patient is right handed.    Social Determinants of Health   Financial Resource Strain:   . Difficulty of Paying Living Expenses:   Food Insecurity:   . Worried About Charity fundraiser in the  Last Year:   . [place] in the Last Year:   Transportation Needs:   . Film/video editor (Medical):   Marland Kitchen Lack of Transportation (Non-Medical):   Physical Activity:   . Days of Exercise per Week:   . Minutes of Exercise per Session:   Stress:   . Feeling of Stress :   Social Connections:   . Frequency of Communication with Friends and Family:   . Frequency of Social Gatherings with Friends and Family:   . Attends Religious Services:   . Active Member of Clubs or Organizations:   . Attends Archivist Meetings:   Marland Kitchen Marital Status:   Intimate Partner Violence:   . Fear of Current or Ex-Partner:   . Emotionally Abused:   Marland Kitchen Physically Abused:   . Sexually Abused:       PHYSICAL EXAM  Vitals:   06/10/19 0757  BP: 130/74  Pulse: 69  Temp: (!) 97 F (36.1 C)  SpO2: 99%  Weight: 133 lb (60.3 kg)  Height: 5' (1.524 m)   Body mass index is 25.97 kg/m.  Generalized: Well developed, in no acute distress  Cardiology: normal rate and rhythm, no murmur noted Respiratory: clear to auscultation bilaterally  Neurological examination  Mentation: Alert oriented to time, place, history taking. Follows all commands speech and language fluent Cranial nerve II-XII: Pupils were equal round reactive to light. Extraocular movements were full, visual field were full on confrontational test. Facial sensation and strength were normal. Head turning and shoulder shrug  were normal and symmetric. Motor: The motor testing reveals 5 over 5 strength of all 4 extremities. Good symmetric motor tone is noted throughout.   Gait and station: Gait is normal.   Reflexes: Deep tendon reflexes are symmetric and normal bilaterally.   DIAGNOSTIC DATA (LABS, IMAGING, TESTING) - I reviewed patient records, labs, notes, testing and imaging myself where available.  No flowsheet data found.   Lab Results  Component Value Date   WBC 7.0 09/16/2018   HGB 12.4 09/16/2018   HCT 37.8 09/16/2018   MCV 102.5 (H) 09/16/2018   PLT 307.0 09/16/2018      Component Value Date/Time   NA 137 09/16/2018 1458   K 4.7 09/16/2018 1458   CL 105 09/16/2018 1458   CO2 25 09/16/2018 1458   GLUCOSE 104 (H) 09/16/2018 1458   BUN 14 09/16/2018 1458   CREATININE 0.80 09/16/2018 1458   CALCIUM 9.6 09/16/2018 1458   PROT 7.3 09/16/2018 1458   ALBUMIN 4.4 09/16/2018 1458   AST 13 09/16/2018 1458   ALT 11 09/16/2018 1458   ALKPHOS 76 09/16/2018 1458   BILITOT 0.3 09/16/2018 1458   GFRNONAA >60 11/21/2015 1104   GFRAA >60 11/21/2015 1104   No results found for: CHOL, HDL, LDLCALC, LDLDIRECT, TRIG, CHOLHDL No results found for: HGBA1C Lab Results  Component Value Date   VITAMINB12 413 09/16/2018   Lab Results  Component Value Date   TSH 3.49 09/16/2018       ASSESSMENT AND PLAN 74 y.o. year old female  has a past medical history of Bowel obstruction ([place]), Breast cancer ([place]), Headache(784.0), Migraine, and Vision abnormalities. here with     ICD-10-CM   1. Migraine without status migrainosus, not intractable, unspecified migraine type  G43.909 SUMAtriptan (IMITREX) 100 MG tablet    propranolol ER (INDERAL LA) 60 MG 24 hr capsule  2. Polypharmacy  Z79.899     Ms. Mccolgan reports that migraines continue nearly every  day. Unfortunately, she is a poor historian and unclear of specifics related to her care in the past.  I have been able to review her chart for clarity on previous history and treatment plans.  She has weaned topiramate.  I am hesitant to resume this medication as she is having difficulty with inattention and recall.  I have reeducated her on Dr.  Rhea Belton previous plan of starting Inderal 60 mg daily.  I have called this medication into her pharmacy and educated her on appropriate usage.  We have reviewed potential side effects.  I have also provided detailed written instructions and medication information in her AVS for review.  She may continue Emgality every month.  She will also continue sumatriptan as needed for abortive therapy.  We have, again, reviewed appropriate administration of abortive therapies.  I have expressed my concerns of polypharmacy.  I do feel that cognitive difficulties are related to polypharmacy.  I have offered to complete an MMSE but patient declines.  She is completely oriented to time and place today.  No concerning findings on neuro examination.  She has been on multiple migraine preventative and abortive therapies in the past.  I am concerned that medication regimen could be contributing to rebound headaches.  She was advised to follow-up closely with primary care for management of comorbidities.  She will follow with pain management as directed.  She will return to see [place] in 3 to 6 months.  She verbalizes understanding and agreement with this plan.   No orders of the defined types were placed in this encounter.    Meds ordered this encounter  Medications  . SUMAtriptan (IMITREX) 100 MG tablet    Sig: May repeat in 2 hours if headache persists or recurs. Max 2 tablets in 24 hours.    Dispense:  9 tablet    Refill:  6    Order Specific Question:   Supervising Provider    Answer:   Melvenia Beam [id number]  . propranolol ER (INDERAL LA) 60 MG 24 hr capsule    Sig: Take 1 capsule (60 mg total) by mouth daily.    Dispense:  90 capsule    Refill:  3    Order Specific Question:   Supervising Provider    Answer:   Melvenia Beam [id number]      I spent 15 minutes with the patient. 50% of this time was spent counseling and educating patient on plan of care and medications.    Debbora Presto, FNP-C 06/10/2019, 9:58  AM [facility] [address] [place], [place] [address] [phone]

## 2019-06-10 ENCOUNTER — Ambulatory Visit (INDEPENDENT_AMBULATORY_CARE_PROVIDER_SITE_OTHER): Payer: Medicare Other | Admitting: Family Medicine

## 2019-06-10 ENCOUNTER — Encounter: Payer: Self-pay | Admitting: Family Medicine

## 2019-06-10 ENCOUNTER — Other Ambulatory Visit: Payer: Self-pay

## 2019-06-10 VITALS — BP 130/74 | HR 69 | Temp 97.0°F | Ht 60.0 in | Wt 133.0 lb

## 2019-06-10 DIAGNOSIS — G43909 Migraine, unspecified, not intractable, without status migrainosus: Secondary | ICD-10-CM | POA: Diagnosis not present

## 2019-06-10 DIAGNOSIS — Z79899 Other long term (current) drug therapy: Secondary | ICD-10-CM

## 2019-06-10 MED ORDER — SUMATRIPTAN SUCCINATE 100 MG PO TABS: ORAL_TABLET | ORAL | 6 refills | Status: DC

## 2019-06-10 MED ORDER — PROPRANOLOL HCL ER 60 MG PO CP24: 60.0000 mg | ORAL_CAPSULE | Freq: Every day | ORAL | 3 refills | Status: DC

## 2019-06-10 NOTE — Patient Instructions (Addendum)
I will restart propranolol LA (Inderal) 60mg  daily for migraine prevention. Please continue Emgality every month. You may use sumatriptan as needed to get rid of migraine. You can take 100mg  at onset of migraine. You can take another 100mg  tablet in 2 hours if needed but please do not use more than 2 tablets in 24 hours or 9 tablets per month.   Drink plenty of water and eat a healthy diet. Please continue close follow up with PCP. Consider psychiatry for help with mood.   Follow up in 3-6 months     Propranolol Extended-Release Capsules What is this medicine? PROPRANOLOL (proe PRAN oh lole) is a beta blocker. It decreases the amount of work your heart has to do and helps your heart beat regularly. It treats high blood pressure and/or prevent chest pain (also called angina). This medicine may be used for other purposes; ask your health care provider or pharmacist if you have questions. COMMON BRAND NAME(S): Inderal LA, Inderal XL, InnoPran XL What should I tell my health care provider before I take this medicine? They need to know if you have any of these conditions:  circulation problems, or blood vessel disease  diabetes  history of heart attack or heart disease, vasospastic angina  kidney disease  liver disease  lung or breathing disease, like asthma or emphysema  pheochromocytoma  slow heart rate  thyroid disease  an unusual or allergic reaction to propranolol, other beta-blockers, medicines, foods, dyes, or preservatives  pregnant or trying to get pregnant  breast-feeding How should I use this medicine? Take this drug by mouth. Take it as directed on the prescription label at the same time every day. Do not cut, crush or chew this drug. Swallow the capsules whole. You can take it with or without food. If it upsets your stomach, take it with food. Keep taking it unless your health care provider tells you to stop. Talk to your health care provider about the use of this  drug in children. Special care may be needed. Overdosage: If you think you have taken too much of this medicine contact a poison control center or emergency room at once. NOTE: This medicine is only for you. Do not share this medicine with others. What if I miss a dose? If you miss a dose, take it as soon as you can. If it is almost time for your next dose, take only that dose. Do not take double or extra doses. What may interact with this medicine? Do not take this medicine with any of the following medications:  feverfew  phenothiazines like chlorpromazine, mesoridazine, prochlorperazine, thioridazine This medicine may also interact with the following medications:  aluminum hydroxide gel  antipyrine  antiviral medicines for HIV or AIDS  barbiturates like phenobarbital  certain medicines for blood pressure, heart disease, irregular heart beat  cimetidine  ciprofloxacin  diazepam  fluconazole  haloperidol  isoniazid  medicines for cholesterol like cholestyramine or colestipol  medicines for mental depression  medicines for migraine headache like almotriptan, eletriptan, frovatriptan, naratriptan, rizatriptan, sumatriptan, zolmitriptan  NSAIDs, medicines for pain and inflammation, like ibuprofen or naproxen  phenytoin  rifampin  teniposide  theophylline  thyroid medicines  tolbutamide  warfarin  zileuton This list may not describe all possible interactions. Give your health care provider a list of all the medicines, herbs, non-prescription drugs, or dietary supplements you use. Also tell them if you smoke, drink alcohol, or use illegal drugs. Some items may interact with your medicine. What should I  watch for while using this medicine? Visit your doctor or health care professional for regular check ups. Contact your doctor right away if your symptoms worsen. Check your blood pressure and pulse rate regularly. Ask your health care professional what your blood  pressure and pulse rate should be, and when you should contact them. Do not stop taking this medicine suddenly. This could lead to serious heart-related effects. You may get drowsy or dizzy. Do not drive, use machinery, or do anything that needs mental alertness until you know how this drug affects you. Do not stand or sit up quickly, especially if you are an older patient. This reduces the risk of dizzy or fainting spells. Alcohol can make you more drowsy and dizzy. Avoid alcoholic drinks. This medicine may increase blood sugar. Ask your healthcare provider if changes in diet or medicines are needed if you have diabetes. Do not treat yourself for coughs, colds, or pain while you are taking this medicine without asking your doctor or health care professional for advice. Some ingredients may increase your blood pressure. What side effects may I notice from receiving this medicine? Side effects that you should report to your doctor or health care professional as soon as possible:  allergic reactions like skin rash, itching or hives, swelling of the face, lips, or tongue  breathing problems  cold hands or feet  difficulty sleeping, nightmares  dry peeling skin  hallucinations  muscle cramps or weakness   signs and symptoms of high blood sugar such as being more thirsty or hungry or having to urinate more than normal. You may also feel very tired or have blurry vision.  slow heart rate  swelling of the legs and ankles  vomiting Side effects that usually do not require medical attention (report to your doctor or health care professional if they continue or are bothersome):  change in sex drive or performance  diarrhea  dry sore eyes  hair loss  nausea  weak or tired This list may not describe all possible side effects. Call your doctor for medical advice about side effects. You may report side effects to FDA at 1-800-FDA-1088. Where should I keep my medicine? Keep out of the  reach of children and pets. Store at room temperature between 15 and 30 degrees C (59 and 86 degrees F). Protect from light and moisture. Keep the container tightly closed. Avoid exposure to extreme heat. Do not freeze. Throw away any unused drug after the expiration date. NOTE: This sheet is a summary. It may not cover all possible information. If you have questions about this medicine, talk to your doctor, pharmacist, or health care provider.  2020 Elsevier/Gold Standard (2018-09-07 16:23:26)   Migraine Headache A migraine headache is a very strong throbbing pain on one side or both sides of your head. This type of headache can also cause other symptoms. It can last from 4 hours to 3 days. Talk with your doctor about what things may bring on (trigger) this condition. What are the causes? The exact cause of this condition is not known. This condition may be triggered or caused by:  Drinking alcohol.  Smoking.  Taking medicines, such as: ? Medicine used to treat chest pain (nitroglycerin). ? Birth control pills. ? Estrogen. ? Some blood pressure medicines.  Eating or drinking certain products.  Doing physical activity. Other things that may trigger a migraine headache include:  Having a menstrual period.  Pregnancy.  Hunger.  Stress.  Not getting enough sleep or getting  too much sleep.  Weather changes.  Tiredness (fatigue). What increases the risk?  Being 90-86 years old.  Being female.  Having a family history of migraine headaches.  Being Caucasian.  Having depression or anxiety.  Being very overweight. What are the signs or symptoms?  A throbbing pain. This pain may: ? Happen in any area of the head, such as on one side or both sides. ? Make it hard to do daily activities. ? Get worse with physical activity. ? Get worse around bright lights or loud noises.  Other symptoms may include: ? Feeling sick to your stomach  (nauseous). ? Vomiting. ? Dizziness. ? Being sensitive to bright lights, loud noises, or smells.  Before you get a migraine headache, you may get warning signs (an aura). An aura may include: ? Seeing flashing lights or having blind spots. ? Seeing bright spots, halos, or zigzag lines. ? Having tunnel vision or blurred vision. ? Having numbness or a tingling feeling. ? Having trouble talking. ? Having weak muscles.  Some people have symptoms after a migraine headache (postdromal phase), such as: ? Tiredness. ? Trouble thinking (concentrating). How is this treated?  Taking medicines that: ? Relieve pain. ? Relieve the feeling of being sick to your stomach. ? Prevent migraine headaches.  Treatment may also include: ? Having acupuncture. ? Avoiding foods that bring on migraine headaches. ? Learning ways to control your body functions (biofeedback). ? Therapy to help you know and deal with negative thoughts (cognitive behavioral therapy). Follow these instructions at home: Medicines  Take over-the-counter and prescription medicines only as told by your doctor.  Ask your doctor if the medicine prescribed to you: ? Requires you to avoid driving or using heavy machinery. ? Can cause trouble pooping (constipation). You may need to take these steps to prevent or treat trouble pooping:  Drink enough fluid to keep your pee (urine) pale yellow.  Take over-the-counter or prescription medicines.  Eat foods that are high in fiber. These include beans, whole grains, and fresh fruits and vegetables.  Limit foods that are high in fat and sugar. These include fried or sweet foods. Lifestyle  Do not drink alcohol.  Do not use any products that contain nicotine or tobacco, such as cigarettes, e-cigarettes, and chewing tobacco. If you need help quitting, ask your doctor.  Get at least 8 hours of sleep every night.  Limit and deal with stress. General instructions      Keep a  journal to find out what may bring on your migraine headaches. For example, write down: ? What you eat and drink. ? How much sleep you get. ? Any change in what you eat or drink. ? Any change in your medicines.  If you have a migraine headache: ? Avoid things that make your symptoms worse, such as bright lights. ? It may help to lie down in a dark, quiet room. ? Do not drive or use heavy machinery. ? Ask your doctor what activities are safe for you.  Keep all follow-up visits as told by your doctor. This is important. Contact a doctor if:  You get a migraine headache that is different or worse than others you have had.  You have more than 15 headache days in one month. Get help right away if:  Your migraine headache gets very bad.  Your migraine headache lasts longer than 72 hours.  You have a fever.  You have a stiff neck.  You have trouble seeing.  Your muscles  feel weak or like you cannot control them.  You start to lose your balance a lot.  You start to have trouble walking.  You pass out (faint).  You have a seizure. Summary  A migraine headache is a very strong throbbing pain on one side or both sides of your head. These headaches can also cause other symptoms.  This condition may be treated with medicines and changes to your lifestyle.  Keep a journal to find out what may bring on your migraine headaches.  Contact a doctor if you get a migraine headache that is different or worse than others you have had.  Contact your doctor if you have more than 15 headache days in a month. This information is not intended to replace advice given to you by your health care provider. Make sure you discuss any questions you have with your health care provider. Document Revised: 05/22/2018 Document Reviewed: 03/12/2018 Elsevier Patient Education  [employer].

## 2019-06-15 ENCOUNTER — Encounter: Payer: Self-pay | Admitting: Internal Medicine

## 2019-06-19 ENCOUNTER — Other Ambulatory Visit: Payer: Self-pay | Admitting: Internal Medicine

## 2019-06-22 ENCOUNTER — Encounter: Payer: Self-pay | Admitting: Internal Medicine

## 2019-06-22 ENCOUNTER — Other Ambulatory Visit: Payer: Self-pay

## 2019-06-22 ENCOUNTER — Ambulatory Visit (INDEPENDENT_AMBULATORY_CARE_PROVIDER_SITE_OTHER): Payer: Medicare Other | Admitting: Internal Medicine

## 2019-06-22 VITALS — BP 130/80 | HR 80 | Ht 60.0 in | Wt 132.0 lb

## 2019-06-22 DIAGNOSIS — R7309 Other abnormal glucose: Secondary | ICD-10-CM | POA: Diagnosis not present

## 2019-06-22 DIAGNOSIS — M818 Other osteoporosis without current pathological fracture: Secondary | ICD-10-CM

## 2019-06-22 LAB — BASIC METABOLIC PANEL
BUN: 13 mg/dL (ref 6–23)
CO2: 28 mEq/L (ref 19–32)
Calcium: 9.9 mg/dL (ref 8.4–10.5)
Chloride: 100 mEq/L (ref 96–112)
Creatinine, Ser: 0.75 mg/dL (ref 0.40–1.20)
GFR: 75.36 mL/min (ref >60.00)
Glucose, Bld: 101 mg/dL — ABNORMAL HIGH (ref 70–99)
Potassium: 4.8 mEq/L (ref 3.5–5.1)
Sodium: 135 mEq/L (ref 135–145)

## 2019-06-22 LAB — VITAMIN D 25 HYDROXY (VIT D DEFICIENCY, FRACTURES): VITD: 80.41 ng/mL (ref 30.00–100.00)

## 2019-06-22 LAB — HEMOGLOBIN A1C: Hgb A1c MFr Bld: 5.2 % (ref 4.6–6.5)

## 2019-06-22 NOTE — Patient Instructions (Addendum)
Please stop at the lab.  Continue ergocalciferol 50,000 units weekly.  Continue Prolia.   Try to get ~1200 mg calcium ideally from diet.  Try to get ~50 g proteins a day.  Please come back for a follow-up appointment in 1 year.  How Can I Prevent Falls? Men and women with osteoporosis need to take care not to fall down. Falls can break bones. Some reasons people fall are: Poor vision  Poor balance  Certain diseases that affect how you walk  Some types of medicine, such as sleeping pills.  Some tips to help prevent falls outdoors are: Use a cane or walker  Wear rubber-soled shoes so you don't slip  Walk on grass when sidewalks are slippery  In winter, put salt or kitty litter on icy sidewalks.  Some ways to help prevent falls indoors are: Keep rooms free of clutter, especially on floors  Use plastic or carpet runners on slippery floors  Wear low-heeled shoes that provide good support  Do not walk in socks, stockings, or slippers  Be sure carpets and area rugs have skid-proof backs or are tacked to the floor  Be sure stairs are well lit and have rails on both sides  Put grab bars on bathroom walls near tub, shower, and toilet  Use a rubber bath mat in the shower or tub  Keep a flashlight next to your bed  Use a sturdy step stool with a handrail and wide steps  Add more lights in rooms (and night lights) Buy a cordless phone to keep with you so that you don't have to rush to the phone       when it rings and so that you can call for help if you fall.   (adapted from http://www.niams.[employer]life[employer]views.se)  Please check out the following book about best diet for bone health:   Exercise for Strong Bones (from [employer]) There are two types of exercises that are important for building and maintaining bone density:  weight-bearing and muscle-strengthening exercises. Weight-bearing Exercises These exercises include  activities that make you move against gravity while staying upright. Weight-bearing exercises can be high-impact or low-impact. High-impact weight-bearing exercises help build bones and keep them strong. If you have broken a bone due to osteoporosis or are at risk of breaking a bone, you may need to avoid high-impact exercises. If you're not sure, you should check with your healthcare provider. Examples of high-impact weight-bearing exercises are: . Dancing . Doing high-impact aerobics . Hiking . Jogging/running . Jumping Rope . Stair climbing . Tennis Low-impact weight-bearing exercises can also help keep bones strong and are a safe alternative if you cannot do high-impact exercises. Examples of low-impact weight-bearing exercises are: . Using elliptical training machines . Doing low-impact aerobics . Using stair-step machines . Fast walking on a treadmill or outside Muscle-Strengthening Exercises These exercises include activities where you move your body, a weight or some other resistance against gravity. They are also known as resistance exercises and include: . Lifting weights . Using elastic exercise bands . Using weight machines . Lifting your own body weight . Functional movements, such as standing and rising up on your toes Yoga and Pilates can also improve strength, balance and flexibility. However, certain positions may not be safe for people with osteoporosis or those at increased risk of broken bones. For example, exercises that have you bend forward may increase the chance of breaking a bone in the spine. A physical therapist should be able to help you  learn which exercises are safe and appropriate for you. Non-Impact Exercises Non-impact exercises can help you to improve balance, posture and how well you move in everyday activities. These exercises can also help to increase muscle strength and decrease the risk of falls and broken bones. Some of these exercises  include: . Balance exercises that strengthen your legs and test your balance, such as Tai Chi, can decrease your risk of falls. . Posture exercises that improve your posture and reduce rounded or "sloping" shoulders can help you decrease the chance of breaking a bone, especially in the spine. . Functional exercises that improve how well you move can help you with everyday activities and decrease your chance of falling and breaking a bone. For example, if you have trouble getting up from a chair or climbing stairs, you should do these activities as exercises. A physical therapist can teach you balance, posture and functional exercises. Starting a New Exercise Program If you haven't exercised regularly for a while, check with your healthcare provider before beginning a new exercise program--particularly if you have health problems such as heart disease, diabetes or high blood pressure. If you're at high risk of breaking a bone, you should work with a physical therapist to develop a safe exercise program. Once you have your healthcare provider's approval, start slowly. If you've already broken bones in the spine because of osteoporosis, be very careful to avoid activities that require reaching down, bending forward, rapid twisting motions, heavy lifting and those that increase your chance of a fall. As you get started, your muscles may feel sore for a day or two after you exercise. If soreness lasts longer, you may be working too hard and need to ease up. Exercises should be done in a pain-free range of motion. How Much Exercise Do You Need? Weight-bearing exercises 30 minutes on most days of the week. Do a 30-minutesession or multiple sessions spread out throughout the day. The benefits to your bones are the same.   Muscle-strengthening exercises Two to three days per week. If you don't have much time for strengthening/resistance training, do small amounts at a time. You can do just one body part each day.  For example do arms one day, legs the next and trunk the next. You can also spread these exercises out during your normal day.  Balance, posture and functional exercises Every day or as often as needed. You may want to focus on one area more than the others. If you have fallen or lose your balance, spend time doing balance exercises. If you are getting rounded shoulders, work more on posture exercises. If you have trouble climbing stairs or getting up from the couch, do more functional exercises. You can also perform these exercises at one time or spread them during your day. Work with a phyiscal therapist to learn the right exercises for you.

## 2019-06-22 NOTE — Progress Notes (Signed)
Patient ID: Jill Salinas, female   DOB: [birth year], 74 y.o.   MRN: [id number]   This visit occurred during the SARS-CoV-2 public health emergency.  Safety protocols were in place, including screening questions prior to the visit, additional usage of staff PPE, and extensive cleaning of exam room while observing appropriate contact time as indicated for disinfecting solutions.   HPI  Jill Salinas is a 74 y.o.-year-old female, referred by her PCP, Dr. Jerilee Hoh, for management of osteoporosis (OP). She saw Endocrinologist in [place].  Pt was dx with OP >10 years ago.  I reviewed pt's DXA scan reports: Date L1-L4 T score FN T score 33% distal Radius  06/07/2019 ([facility])  -2.2 RFN: -2.8 LFN: -2.9 n/a   She denies fractures.  No falls.  No dizziness/vertigo/orthostasis/poor vision. She has imbalance and has pain in her legs - knees. Also swelling and pain in legs.  Previous OP treatments:  - po Bisphosphonate: Fosamax - no significant effect - Forteo several years ago  - Prolia since 6-8 years ago (last dose 09/2018) - generalized bone pain  No h/o vitamin D deficiency. Reviewed available vit D levels: Lab Results  Component Value Date   VD25OH 78.31 09/16/2018   VD25OH 67.5 05/12/2017   Pt is on: - calcium  - in prenatal vitamin - vitamin D (ergocalciferol) 50,000 units once a week   No weight bearing exercises 2/2 joint pain.  She does not take high vitamin A doses.  Of note, she has a history of steroid injections in the spine. Also, as a child - she had eczema so she was on steroids long-term: im for many years. She is on oxycodone and seen in pain management clinic for bone and joint pains.  Menopause was at 74 y/o (surgical). No HRT.  FH of osteoporosis: Mother and father-both with hip fractures.  No h/o hyper/hypocalcemia or hyperparathyroidism. No h/o kidney stones. Lab Results  Component Value Date   CALCIUM 9.6 09/16/2018   CALCIUM 9.4 11/21/2015   No h/o  thyrotoxicosis. Reviewed TSH recent levels:  Lab Results  Component Value Date   TSH 3.49 09/16/2018   TSH 2.290 05/12/2017   No h/o CKD. Last BUN/Cr: Lab Results  Component Value Date   BUN 14 09/16/2018   CREATININE 0.80 09/16/2018   She has a history of GERD and is on Nexium.  She has a history of breast cancer, s/p chemotherapy and radiation therapy.  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  ROS: Constitutional: + weight gain, no weight loss, +  fatigue, no subjective hyperthermia, no subjective hypothermia, + nocturia Eyes: + blurry vision, no xerophthalmia ENT: no sore throat, no nodules palpated in neck, no dysphagia, no odynophagia, no hoarseness, + tinnitus, no hypoacusis Cardiovascular: + CP, + SOB, + palpitations, + leg swelling Respiratory: + cough, + SOB, no wheezing Gastrointestinal: no N, no V, no D, + C, + acid reflux Musculoskeletal: + muscle, + joint aches Skin: no rash, + hair loss, + itching, + easy bruising Neurological: no tremors, no numbness or tingling/no dizziness/+ HAs Psychiatric: + depression, + anxiety  I reviewed pt's medications, allergies, PMH, social hx, family hx, and changes were documented in the history of present illness. Otherwise, unchanged from my initial visit note.  Past Medical History:  Diagnosis Date  . Bowel obstruction ([place])   . Breast cancer ([place])   . Headache(784.0)   . Migraine   . Vision abnormalities  glaucoma   Past Surgical History:  Procedure Laterality Date  . BREAST LUMPECTOMY    . TOTAL ABDOMINAL HYSTERECTOMY N/A    Social History   Socioeconomic History  . Marital status: Married    Spouse name: Gwyndolyn Saxon   . Number of children: 0  . Years of education: Assoc  . Highest education level: Not on file  Occupational History  . Not on file  Tobacco Use  . Smoking status: Never Smoker  . Smokeless tobacco: Never Used  Substance and Sexual Activity  . Alcohol  use: Yes    Comment: drinks on the weekend  . Drug use: No  . Sexual activity: Not on file  Other Topics Concern  . Not on file  Social History Narrative   Patient lives at home with her husband. Gwyndolyn Saxon    Patient has an Geophysicist/field seismologist.    Patient has no children.    Patient is right handed.    Social Determinants of Health   Financial Resource Strain:   . Difficulty of Paying Living Expenses:   Food Insecurity:   . Worried About Charity fundraiser in the Last Year:   . Arboriculturist in the Last Year:   Transportation Needs:   . Film/video editor (Medical):   Marland Kitchen Lack of Transportation (Non-Medical):   Physical Activity:   . Days of Exercise per Week:   . Minutes of Exercise per Session:   Stress:   . Feeling of Stress :   Social Connections:   . Frequency of Communication with Friends and Family:   . Frequency of Social Gatherings with Friends and Family:   . Attends Religious Services:   . Active Member of Clubs or Organizations:   . Attends Archivist Meetings:   Marland Kitchen Marital Status:   Intimate Partner Violence:   . Fear of Current or Ex-Partner:   . Emotionally Abused:   Marland Kitchen Physically Abused:   . Sexually Abused:    Current Outpatient Medications on File Prior to Visit  Medication Sig Dispense Refill  . Buprenorphine 15 MCG/HR PTWK as needed.    . butalbital-acetaminophen-caffeine (FIORICET) 50-325-40 MG tablet Take one tablet daily as needed for acute migraine. 12 tablets must last 30 days. No early refills allowed. 12 tablet 5  . cyanocobalamin (,VITAMIN B-12,) 1000 MCG/ML injection ADMINISTER 1 ML (1,000 MCG) INTRAMUSCULARLY MONTHLY  10 ML BOTTLE    . denosumab (PROLIA) 60 MG/ML SOLN injection Inject 60 mg into the skin every 6 (six) months. Administer in upper arm, thigh, or abdomen    . diclofenac sodium (VOLTAREN) 1 % GEL Apply 2 g topically as needed.    . DULoxetine (CYMBALTA) 60 MG capsule Take 60 mg by mouth daily.    Marland Kitchen FLUoxetine (PROZAC)  40 MG capsule Take 40 mg by mouth daily.    . folic acid (FOLVITE) 1 MG tablet Take 1 tablet (1 mg total) by mouth daily. 90 tablet 3  . Galcanezumab-gnlm (EMGALITY) 120 MG/ML SOAJ Inject 120 mg into the skin every 30 (thirty) days. 1 pen 11  . LINZESS 290 MCG CAPS capsule TAKE 1 CAPSULE (290 MCG TOTAL) BY MOUTH DAILY BEFORE BREAKFAST. 30 capsule 2  . LUMIGAN 0.01 % SOLN Place 1 drop into both eyes at bedtime.    . Misc Natural Products (LEG VEIN & CIRCULATION PO) Take by mouth.    . modafinil (PROVIGIL) 200 MG tablet Take 200 mg by mouth daily as needed.   1  .  Multiple Vitamins-Minerals (ONE-A-DAY VITACRAVES IMMUNITY PO) Take by mouth.    . ondansetron (ZOFRAN) 4 MG tablet One tab every 8 hours prn.  #20 to last 30 days. 20 tablet 0  . oxyCODONE-acetaminophen (PERCOCET) 10-325 MG tablet Filled by pain clinic  0  . polyethylene glycol (MIRALAX / GLYCOLAX) packet Take 17 g by mouth daily.    . propranolol ER (INDERAL LA) 60 MG 24 hr capsule Take 1 capsule (60 mg total) by mouth daily. 90 capsule 3  . SUMAtriptan (IMITREX) 100 MG tablet May repeat in 2 hours if headache persists or recurs. Max 2 tablets in 24 hours. 9 tablet 6  . thiamine (VITAMIN B-1) 100 MG tablet Take 1 tablet (100 mg total) by mouth daily. 90 tablet 3  . tretinoin (RETIN-A) 0.025 % cream Apply 1 application topically at bedtime.     . triamcinolone cream (KENALOG) 0.1 % APPLY TO CHIN DAILY AT BEDTIME AS NEEDED 30 g 0  . vitamin B-12 (CYANOCOBALAMIN) 1000 MCG tablet Take 1,000 mcg by mouth daily.    Marland Kitchen zolpidem (AMBIEN) 5 MG tablet Take 5 mg by mouth at bedtime as needed for sleep.     No current facility-administered medications on file prior to visit.   Allergies  Allergen Reactions  . Iodinated Diagnostic Agents Other (See Comments)    Tthroat itching Tthroat itching, pt received a 13 hour prep for injection  . Codeine   . Depakote [Divalproex Sodium] Nausea And Vomiting  . Gabapentin Other (See Comments)    Made her  "feel crazy"  . Nortriptyline Other (See Comments)    constipation   Family History  Problem Relation Age of Onset  . Heart disease Mother   . Headache Mother   . Heart attack Father     PE: BP 130/80   Pulse 80   Ht 5' (1.524 m)   Wt 132 lb (59.9 kg)   SpO2 99%   BMI 25.78 kg/m  Wt Readings from Last 3 Encounters:  06/22/19 132 lb (59.9 kg)  06/10/19 133 lb (60.3 kg)  04/29/19 128 lb (58.1 kg)   Constitutional: Normal weight, in NAD. No kyphosis. Eyes: PERRLA, EOMI, no exophthalmos ENT: moist mucous membranes, no thyromegaly, no cervical lymphadenopathy Cardiovascular: RRR, No MRG Respiratory: CTA B Gastrointestinal: abdomen soft, NT, ND, BS+ Musculoskeletal: no deformities, strength intact in all 4 Skin: moist, warm, no rashes Neurological: + mild  tremor with outstretched hands, DTR normal in all 4  Assessment: 1. Osteoporosis  2. Elevated Glucose  Plan: 1. Osteoporosis - likely postmenopausal (she had early, surgical, menopause) and age-related, she has FH of OP, and she also has long-term history of steroid use (im inj's and injections in spine) - Discussed about increased risk of fracture, depending on the T score, greatly increased when the T score is lower than -2.5, but it is actually a continuum and -2.5 should not be regarded as an absolute threshold. We reviewed her latest DXA scan report together, and I explained that based on the T scores, she has an increased risk for fractures. We will ask for records of previous DXA scans and OV's from previous endocrinologist.  - we reviewed her dietary and supplemental calcium and vitamin D intake. I recommended to make sure she gets 1000-1200 mg of calcium daily preferentially from diet and I will check vit D today to see if she needs supplementation. - discussed fall precautions   - given handout from [facility] Re: weight bearing exercises -  advised to do this every day or at least 5/7 days -  we discussed about maintaining a good amount of protein in her diet. The recommended daily protein intake is ~0.8 g per kilogram per day (~50 g daily for her). I advised her to try to aim for this amount, since a diet low in proteins can exacerbate osteoporosis. Also, avoid smoking or >2 drinks of alcohol a day. - I recommended the following book for the concept of low acid eating:  - We discussed about the different medication classes, benefits and side effects (including atypical fractures and ONJ - no dental workup in progress or planned). She has already been on Forteo for 2 years >> we discussed that this is the max lifetime  recommended treatment. She is now sq denosumab (Prolia) sq every 6 months, however, she cannot remember for how long she was taking this (will get records). Also, she is now 9 mo post her last injections and we discussed that it is very dangerous to go beyond 6-7 months between inj's 2/2 risk of significantly increased fracture. We discussed briefly about  zoledronic acid (Reclast) iv 1x a year for 1-2 years. I would use Teriparatide/Abaloparatide (which are daily subcu medication) or Romosozumab (monthly) as a last resort. However, I would suggest to continue Prolia for now - she agrees. Will submit the application to the insurance once her labs are back. - Will check the following labs today: Orders Placed This Encounter  Procedures  . Basic metabolic panel  . VITAMIN D 25 Hydroxy (Vit-D Deficiency, Fractures)  . Hemoglobin A1c  - will check a new DXA scan in 2 years after previous -  first indication that the treatment is working is her not having fractures. DXA scan changes are secondary: unchanged or slightly higher T-scores are desirable - will see pt back in a year  2. Elevated Glucose - mild hyperglycemia on CMP from 09/2018 - per her request, will check a HbA1c  Component     Latest Ref Rng & Units 06/22/2019  Sodium     135 - 145 mEq/L 135  Potassium     3.5  - 5.1 mEq/L 4.8  Chloride     96 - 112 mEq/L 100  CO2     19 - 32 mEq/L 28  Glucose     70 - 99 mg/dL 101 (H)  BUN     6 - 23 mg/dL 13  Creatinine     0.40 - 1.20 mg/dL 0.75  Calcium     8.4 - 10.5 mg/dL 9.9  GFR     >60.00 mL/min 75.36  VITD     30.00 - 100.00 ng/mL 80.41  Hemoglobin A1C     4.6 - 6.5 % 5.2   Glucose slightly high, but nonfasting.  Vitamin D and HbA1c normal.  We will proceed with the Prolia preauthorization process.  Philemon Kingdom, MD PhD [facility] Endocrinology

## 2019-06-22 NOTE — Progress Notes (Signed)
I have reviewed and agreed above plan. 

## 2019-06-23 ENCOUNTER — Telehealth: Payer: Self-pay | Admitting: *Deleted

## 2019-06-23 ENCOUNTER — Telehealth: Payer: Self-pay

## 2019-06-23 MED ORDER — TRETINOIN 0.025 % EX CREA: 1.0000 "application " | TOPICAL_CREAM | Freq: Every day | CUTANEOUS | 1 refills | Status: DC

## 2019-06-23 NOTE — Telephone Encounter (Signed)
[facility]  Patient request refill:  tretinoin (RETIN-A) 0.025 % cream  Not filled by Dr Jerilee Hoh.  Okay to refill?

## 2019-06-23 NOTE — Telephone Encounter (Signed)
Ok to fill 

## 2019-06-23 NOTE — Telephone Encounter (Signed)
Patient has been submitted to the [employer] portal. Currently awaiting on summary of benefits.

## 2019-06-29 ENCOUNTER — Telehealth: Payer: Self-pay | Admitting: Internal Medicine

## 2019-06-29 NOTE — Telephone Encounter (Signed)
Pt is calling in to have a CPE done in July per Dr. Jerilee Hoh and she has scheduled one in August but she is adamant that Dr. Jerilee Hoh wanted her to come in the month of July.  Is it okay for her to keep this appointment or will Dr. Jerilee Hoh want to squeezer her in sooner than August?

## 2019-06-29 NOTE — Telephone Encounter (Signed)
August is fine

## 2019-06-29 NOTE — Telephone Encounter (Signed)
Patient is aware 

## 2019-07-06 ENCOUNTER — Other Ambulatory Visit: Payer: Self-pay | Admitting: Neurology

## 2019-07-06 ENCOUNTER — Other Ambulatory Visit: Payer: Self-pay | Admitting: Internal Medicine

## 2019-07-06 DIAGNOSIS — L309 Dermatitis, unspecified: Secondary | ICD-10-CM

## 2019-07-06 DIAGNOSIS — F339 Major depressive disorder, recurrent, unspecified: Secondary | ICD-10-CM

## 2019-07-13 ENCOUNTER — Telehealth: Payer: Self-pay | Admitting: Internal Medicine

## 2019-07-13 ENCOUNTER — Other Ambulatory Visit: Payer: Self-pay | Admitting: Internal Medicine

## 2019-07-13 MED ORDER — VITAMIN D (ERGOCALCIFEROL) 1.25 MG (50000 UNIT) PO CAPS: 50000.0000 [IU] | ORAL_CAPSULE | ORAL | 3 refills | Status: DC

## 2019-07-13 NOTE — Telephone Encounter (Signed)
Please advise if ok to fill this.

## 2019-07-13 NOTE — Telephone Encounter (Signed)
I sent it  

## 2019-07-13 NOTE — Telephone Encounter (Signed)
Medication Refill Request  . Did you call your pharmacy and request this refill first? YES  . If patient has not contacted pharmacy first, instruct them to do so for future refills.  . Remind them that contacting the pharmacy for their refill is the quickest method to get the refill.  . Refill policy also stated that it will take anywhere between 24-72 hours to receive the refill.    Name of medication? Vitamin D 50,000 IU weekly   Is this a 90 day supply? 90 day preferred  Name and location [facility] [address]

## 2019-08-02 NOTE — Telephone Encounter (Signed)
Patient requests to be called at either ph# [phone] (1st) or ph#  [phone] (2nd) or ph# [phone] (3rd)   re: Patient is waiting to hear from our office regarding scheduling patient's Prolia injection. Patient states her Prolia injection was due in February 2021. Patient states it has been quite awhile since she has heard anything from our office in regards to the above.

## 2019-08-02 NOTE — Telephone Encounter (Signed)
Summary of benefits states that the patient does not require a PA for this medication. Patient has also me $203 of the $203 deductible. Patient does not owe anything for the medication or administration, and she can be scheduled at any time.

## 2019-08-02 NOTE — Telephone Encounter (Signed)
Called pt and scheduled her for Prolia injection on 08/06/19

## 2019-08-03 ENCOUNTER — Telehealth: Payer: Self-pay | Admitting: *Deleted

## 2019-08-03 NOTE — Telephone Encounter (Signed)
Patient spoke with nurse triage. Patient reports she needs advice regarding her legs. She states her feet are numb on the bottom. She says her legs are painful, sore, and swollen. This has been going on for a while and it isn't getting better. Patient was advised to go to ED. Patient refused. Patient has an appointment on 08/06/2019

## 2019-08-04 ENCOUNTER — Telehealth: Payer: Self-pay | Admitting: Internal Medicine

## 2019-08-04 NOTE — Telephone Encounter (Signed)
FYI- Pt called and wanted to let someone know that she will be going to the hospital tomorrow morning because she is in a lot of pain.

## 2019-08-04 NOTE — Telephone Encounter (Signed)
Left detailed message on machine for patient to call back and schedule an appointment.  Okay to schedule with a different provider if Dr Jerilee Hoh is full.

## 2019-08-04 NOTE — Telephone Encounter (Signed)
Will need OV to discuss further

## 2019-08-06 ENCOUNTER — Ambulatory Visit (INDEPENDENT_AMBULATORY_CARE_PROVIDER_SITE_OTHER): Payer: Medicare Other

## 2019-08-06 ENCOUNTER — Other Ambulatory Visit: Payer: Self-pay

## 2019-08-06 DIAGNOSIS — M818 Other osteoporosis without current pathological fracture: Secondary | ICD-10-CM | POA: Diagnosis not present

## 2019-08-06 MED ORDER — DENOSUMAB 60 MG/ML ~~LOC~~ SOSY
60.0000 mg | PREFILLED_SYRINGE | Freq: Once | SUBCUTANEOUS | Status: AC
  Administered 2019-08-06: 60 mg via SUBCUTANEOUS

## 2019-08-06 NOTE — Progress Notes (Signed)
Per orders of Dr. Gherghe injection of Prolia given today by Ivin Rosenbloom W . Patient tolerated injection well. 

## 2019-08-09 ENCOUNTER — Other Ambulatory Visit: Payer: Self-pay

## 2019-08-09 ENCOUNTER — Ambulatory Visit (INDEPENDENT_AMBULATORY_CARE_PROVIDER_SITE_OTHER): Payer: Medicare Other | Admitting: Internal Medicine

## 2019-08-09 ENCOUNTER — Encounter: Payer: Self-pay | Admitting: Internal Medicine

## 2019-08-09 VITALS — BP 160/80 | HR 97 | Temp 98.0°F | Ht 60.0 in | Wt 129.2 lb

## 2019-08-09 DIAGNOSIS — G8929 Other chronic pain: Secondary | ICD-10-CM

## 2019-08-09 DIAGNOSIS — F339 Major depressive disorder, recurrent, unspecified: Secondary | ICD-10-CM

## 2019-08-09 DIAGNOSIS — M79652 Pain in left thigh: Secondary | ICD-10-CM

## 2019-08-09 NOTE — Progress Notes (Signed)
Chief Complaint  Patient presents with  . Leg Pain    both legs hurt, about 2 months ago that a huge bruise from running into furniture during the night, left thigh pain    HPI: Jill Salinas 74 y.o. come in for  SDA  PCP appt NA   Co leg pain  See triage note  Below  Hit   Area on left side and still hurts  to lay on that side  Sore and achy to knee   feels like lightening bolt  Different pain around hip bony area .    Huge bruise  At onset and then .   And spread over time.   Has improve  Some   Able to walk   But concern  About cause   She has osteoporosis followed endocrine on prolia  ,Migraine headaches managed  neurology . Hx of breast cancer with chemo and radiation   . Has neuropathy feet sx but not know why  hurts every morning  Depressed about all of this  But and on meds but not seem to get anywyra has tried  Modalities but nothing seems to help that much .  Uncertain if  Seen urologist . No counselor     JR  9:11 AM Note Patient spoke with nurse triage. Patient reports she needs advice regarding her legs. She states her feet are numb on the bottom. She says her legs are painful, sore, and swollen. This has been going on for a while and it isn't getting better. Patient was advised to go to ED. Patient refused. Patient has an appointment on 08/06/2019     Vascular assessment in march 2021  Had  ncs and emg in 2015 Patient had bilateral ABIs performed today which were triphasic greater than 1 bilaterally  ASSESSMENT: Patient with chronic lower extremity pain probably multifactorial in nature.  No evidence of arterial occlusive disease.  She may have an element of venous reflux.  However all of her aches and pains are probably not explainable through one etiology.  Patient was reassured today that if she does have venous reflux this can be a nuisance problem but again was probably not the one single diagnosis related to her leg pain.  She was reassured that she is not at  risk of limb loss.  PLAN: Patient was fitted for knee-high 25 to 30 mm compression stockings today rather than zip up compression that she has been using.  Hopefully this will at least give her some symptomatic relief.  If she does not improve with her compression stockings we could see her back again in 6 months time and do a venous duplex exam for reflux.  However, I believe that again her constellation of symptoms is probably multifactorial and an intervention for her veins is probably not going to be the solution for all of this. Ruta Hinds, MD [facility] Office: [phone] Pager: [phone]  ROS: See pertinent positives and negatives per HPI.  Past Medical History:  Diagnosis Date  . Bowel obstruction ([place])   . Breast cancer ([place])   . Headache(784.0)   . Migraine   . Vision abnormalities    glaucoma    Family History  Problem Relation Age of Onset  . Heart disease Mother   . Headache Mother   . Heart attack Father     Social History   Socioeconomic History  . Marital status: Married    Spouse name: Gwyndolyn Saxon   .  Number of children: 0  . Years of education: Assoc  . Highest education level: Not on file  Occupational History  . Occupation: retired  Tobacco Use  . Smoking status: Never Smoker  . Smokeless tobacco: Never Used  Vaping Use  . Vaping Use: Never used  Substance and Sexual Activity  . Alcohol use: Yes    Alcohol/week: 2.0 standard drinks    Types: 2 Standard drinks or equivalent per week    Comment: drinks on the weekend  . Drug use: No  . Sexual activity: Not on file  Other Topics Concern  . Not on file  Social History Narrative   Patient lives at home with her husband. Gwyndolyn Saxon    Patient has an Geophysicist/field seismologist.    Patient has no children.    Patient is right handed.    Social Determinants of Health   Financial Resource Strain:   . Difficulty of Paying Living Expenses:   Food Insecurity:   .  Worried About Charity fundraiser in the Last Year:   . Arboriculturist in the Last Year:   Transportation Needs:   . Film/video editor (Medical):   Marland Kitchen Lack of Transportation (Non-Medical):   Physical Activity:   . Days of Exercise per Week:   . Minutes of Exercise per Session:   Stress:   . Feeling of Stress :   Social Connections:   . Frequency of Communication with Friends and Family:   . Frequency of Social Gatherings with Friends and Family:   . Attends Religious Services:   . Active Member of Clubs or Organizations:   . Attends Archivist Meetings:   Marland Kitchen Marital Status:     Outpatient Medications Prior to Visit  Medication Sig Dispense Refill  . Buprenorphine 15 MCG/HR PTWK as needed.    . butalbital-acetaminophen-caffeine (FIORICET) 50-325-40 MG tablet Take one tablet daily as needed for acute migraine. 12 tablets must last 30 days. No early refills allowed. 12 tablet 5  . cyanocobalamin (,VITAMIN B-12,) 1000 MCG/ML injection ADMINISTER 1 ML (1,000 MCG) INTRAMUSCULARLY MONTHLY  10 ML BOTTLE    . denosumab (PROLIA) 60 MG/ML SOLN injection Inject 60 mg into the skin every 6 (six) months. Administer in upper arm, thigh, or abdomen    . diclofenac sodium (VOLTAREN) 1 % GEL Apply 2 g topically as needed.    . DULoxetine (CYMBALTA) 60 MG capsule Take 60 mg by mouth daily.    Marland Kitchen FLUoxetine (PROZAC) 40 MG capsule Take 40 mg by mouth daily.    . folic acid (FOLVITE) 1 MG tablet TOME UNA TABLETA TODOS LOS DIAS 90 tablet 3  . Galcanezumab-gnlm (EMGALITY) 120 MG/ML SOAJ Inject 120 mg into the skin every 30 (thirty) days. 1 pen 11  . LINZESS 290 MCG CAPS capsule TAKE 1 CAPSULE (290 MCG TOTAL) BY MOUTH DAILY BEFORE BREAKFAST. 30 capsule 2  . LUMIGAN 0.01 % SOLN Place 1 drop into both eyes at bedtime.    . Misc Natural Products (LEG VEIN & CIRCULATION PO) Take by mouth.    . modafinil (PROVIGIL) 200 MG tablet Take 200 mg by mouth daily as needed.   1  . Multiple Vitamins-Minerals  (ONE-A-DAY VITACRAVES IMMUNITY PO) Take by mouth.    . ondansetron (ZOFRAN) 4 MG tablet TAKE 1 TABLET BY MOUTH EVERY 8 HOURS AS NEEDED 20 tablet 0  . oxyCODONE-acetaminophen (PERCOCET) 10-325 MG tablet Filled by pain clinic  0  . polyethylene glycol (MIRALAX / GLYCOLAX)  packet Take 17 g by mouth daily.    . propranolol ER (INDERAL LA) 60 MG 24 hr capsule Take 1 capsule (60 mg total) by mouth daily. 90 capsule 3  . SUMAtriptan (IMITREX) 100 MG tablet May repeat in 2 hours if headache persists or recurs. Max 2 tablets in 24 hours. 9 tablet 6  . thiamine 100 MG tablet TAKE 1 TABLET BY MOUTH EVERY DAY 90 tablet 3  . tretinoin (RETIN-A) 0.025 % cream Apply 1 application topically at bedtime. 45 g 1  . triamcinolone cream (KENALOG) 0.1 % APPLY TO CHIN DAILY AT BEDTIME AS NEEDED 30 g 0  . vitamin B-12 (CYANOCOBALAMIN) 1000 MCG tablet Take 1,000 mcg by mouth daily.    . Vitamin D, Ergocalciferol, (DRISDOL) 1.25 MG (50000 UNIT) CAPS capsule Take 1 capsule (50,000 Units total) by mouth every 7 (seven) days. 15 capsule 3  . zolpidem (AMBIEN) 5 MG tablet Take 5 mg by mouth at bedtime as needed for sleep.     No facility-administered medications prior to visit.     EXAM:  BP (!) 160/80   Pulse 97   Temp 98 F (36.7 C) (Temporal)   Ht 5' (1.524 m)   Wt 129 lb 3.2 oz (58.6 kg)   SpO2 98%   BMI 25.23 kg/m   Body mass index is 25.23 kg/m.  GENERAL: vitals reviewed and listed above, alert, oriented, appears well hydrated and in no acute distress HEENT: atraumatic, conjunctiva  clear, no obvious abnormalities on inspection of external nose and ears OP masked  NECK: no obvious masses on inspection palpation   CV: HRRR, no clubbing cyanosis or  peripheral edema nl cap refill  MS: moves all extremities without noticeable focal  Abnormality  But  Point discomfort  lfet hit ant iliac crest but no mass or bruising    Area of concern left lateral thigh no mass felt and good rom active passive  When laying     Hip rom seems ok  Some djd  Change left knee  No warmth  PSYCH: pleasant and cooperative,  Disc her  difficulties ands  Struggles to get through   Pain  And medication that done seem to help .   Lab Results  Component Value Date   WBC 7.0 09/16/2018   HGB 12.4 09/16/2018   HCT 37.8 09/16/2018   PLT 307.0 09/16/2018   GLUCOSE 101 (H) 06/22/2019   ALT 11 09/16/2018   AST 13 09/16/2018   NA 135 06/22/2019   K 4.8 06/22/2019   CL 100 06/22/2019   CREATININE 0.75 06/22/2019   BUN 13 06/22/2019   CO2 28 06/22/2019   TSH 3.49 09/16/2018   HGBA1C 5.2 06/22/2019   BP Readings from Last 3 Encounters:  08/09/19 (!) 160/80  06/22/19 130/80  06/10/19 130/74    ASSESSMENT AND PLAN:  Discussed the following assessment and plan:  Left thigh pain - sp injury   Other chronic pain  Depression, recurrent ([place]) Suggest  May be helped by counseling around the  Pain and depression.  Not sure any med will resolve it   The are aof injury may have been a muscle hematoma but at this  Point is getting better consider further eval sm oar imagining if  persistent or progressive  Disc physical modalities help with pain  Are better and help with depression  -Patient advised to return or notify health care team  if  new concerns arise. bp up; today not addressed  32  minutes  Counseling  Review and plans  Patient Instructions  I think you have a hematoma under the thigh and taking  a while to heal  .   Should get better .with time   Let us know if   Need a referral to   Sports medicine or  More physical therapy  .   I suggest water therapy   And counseling to help with depression.  You could have neuropathy feet from . Chemo in past.            Standley Brooking. Desi Carby M.D.

## 2019-08-09 NOTE — Patient Instructions (Addendum)
I think you have a hematoma under the thigh and taking  a while to heal  .   Should get better .with time   Let us know if   Need a referral to   Sports medicine or  More physical therapy  .   I suggest water therapy   And counseling to help with depression.  You could have neuropathy feet from . Chemo in past.

## 2019-08-25 ENCOUNTER — Telehealth: Payer: Self-pay | Admitting: Neurology

## 2019-08-25 NOTE — Telephone Encounter (Addendum)
Patient called stating that she is having increasingly worse leg pain and is wanting to know if this has anything to do with starting Emgality. Please advise.

## 2019-08-25 NOTE — Telephone Encounter (Signed)
I returned the call to the patient. She did not answer her phone but I left her a message (ok per DPR). She has been established on [employer] for some time now. Her leg pain should not be caused by this medication. She also has a history of lumbar radiculopathy, leg pain and paresthesia in the past. Previously, she was being seen by pain management. I instructed her to contact that office or her PCP for further work-up of worsening pain. Provided our number to call back if needed.

## 2019-08-25 NOTE — Telephone Encounter (Signed)
Pt called Michelle,RN to thank her for provided information.

## 2019-08-26 ENCOUNTER — Telehealth: Payer: Self-pay | Admitting: Internal Medicine

## 2019-08-26 MED ORDER — CYANOCOBALAMIN 1000 MCG/ML IJ SOLN: INTRAMUSCULAR | 1 refills | Status: DC

## 2019-08-26 NOTE — Telephone Encounter (Signed)
Refill sent.

## 2019-08-26 NOTE — Addendum Note (Signed)
Addended by: Westley Hummer B on: 08/26/2019 03:52 PM   Modules accepted: Orders

## 2019-08-26 NOTE — Telephone Encounter (Signed)
Pt stated she only has one left.  Medication Refill:  Vitamin B-12   Pharmacy:  [id number] - [place], [place]. AT [address] Phone:  [phone]  Fax:  [phone]

## 2019-08-31 ENCOUNTER — Other Ambulatory Visit: Payer: Self-pay

## 2019-08-31 ENCOUNTER — Encounter: Payer: Self-pay | Admitting: Family Medicine

## 2019-08-31 ENCOUNTER — Ambulatory Visit (INDEPENDENT_AMBULATORY_CARE_PROVIDER_SITE_OTHER): Payer: Medicare Other | Admitting: Family Medicine

## 2019-08-31 ENCOUNTER — Ambulatory Visit (INDEPENDENT_AMBULATORY_CARE_PROVIDER_SITE_OTHER)
Admission: RE | Admit: 2019-08-31 | Discharge: 2019-08-31 | Disposition: A | Discharge status: Home / Self Care | Payer: Medicare Other | Source: Ambulatory Visit | Attending: Family Medicine | Admitting: Family Medicine

## 2019-08-31 VITALS — BP 130/72 | HR 95 | Temp 98.1°F | Wt 130.0 lb

## 2019-08-31 DIAGNOSIS — M25552 Pain in left hip: Secondary | ICD-10-CM | POA: Diagnosis not present

## 2019-08-31 DIAGNOSIS — R0781 Pleurodynia: Secondary | ICD-10-CM | POA: Diagnosis not present

## 2019-08-31 DIAGNOSIS — E538 Deficiency of other specified B group vitamins: Secondary | ICD-10-CM | POA: Diagnosis not present

## 2019-08-31 IMAGING — DX DG RIBS 2V*L*
2 series · 2 of 2 positions shown · non-contrast
Comparison: None.

CLINICAL DATA: Left anterior rib pain

EXAM:
LEFT RIBS - 2 VIEW

[rib pa]
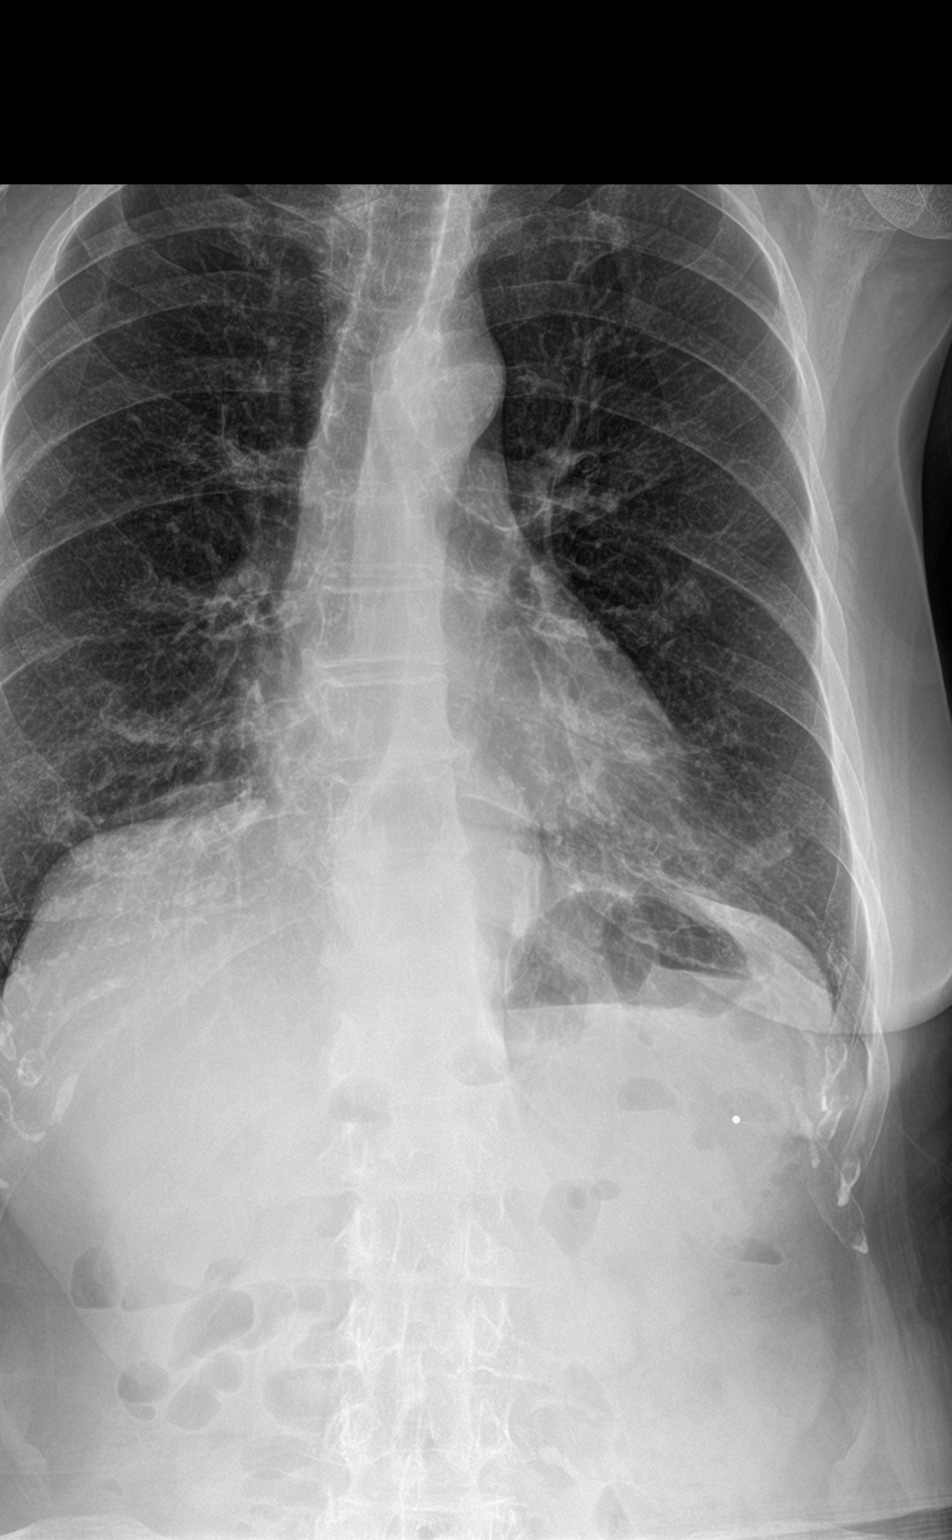

[rib obl]
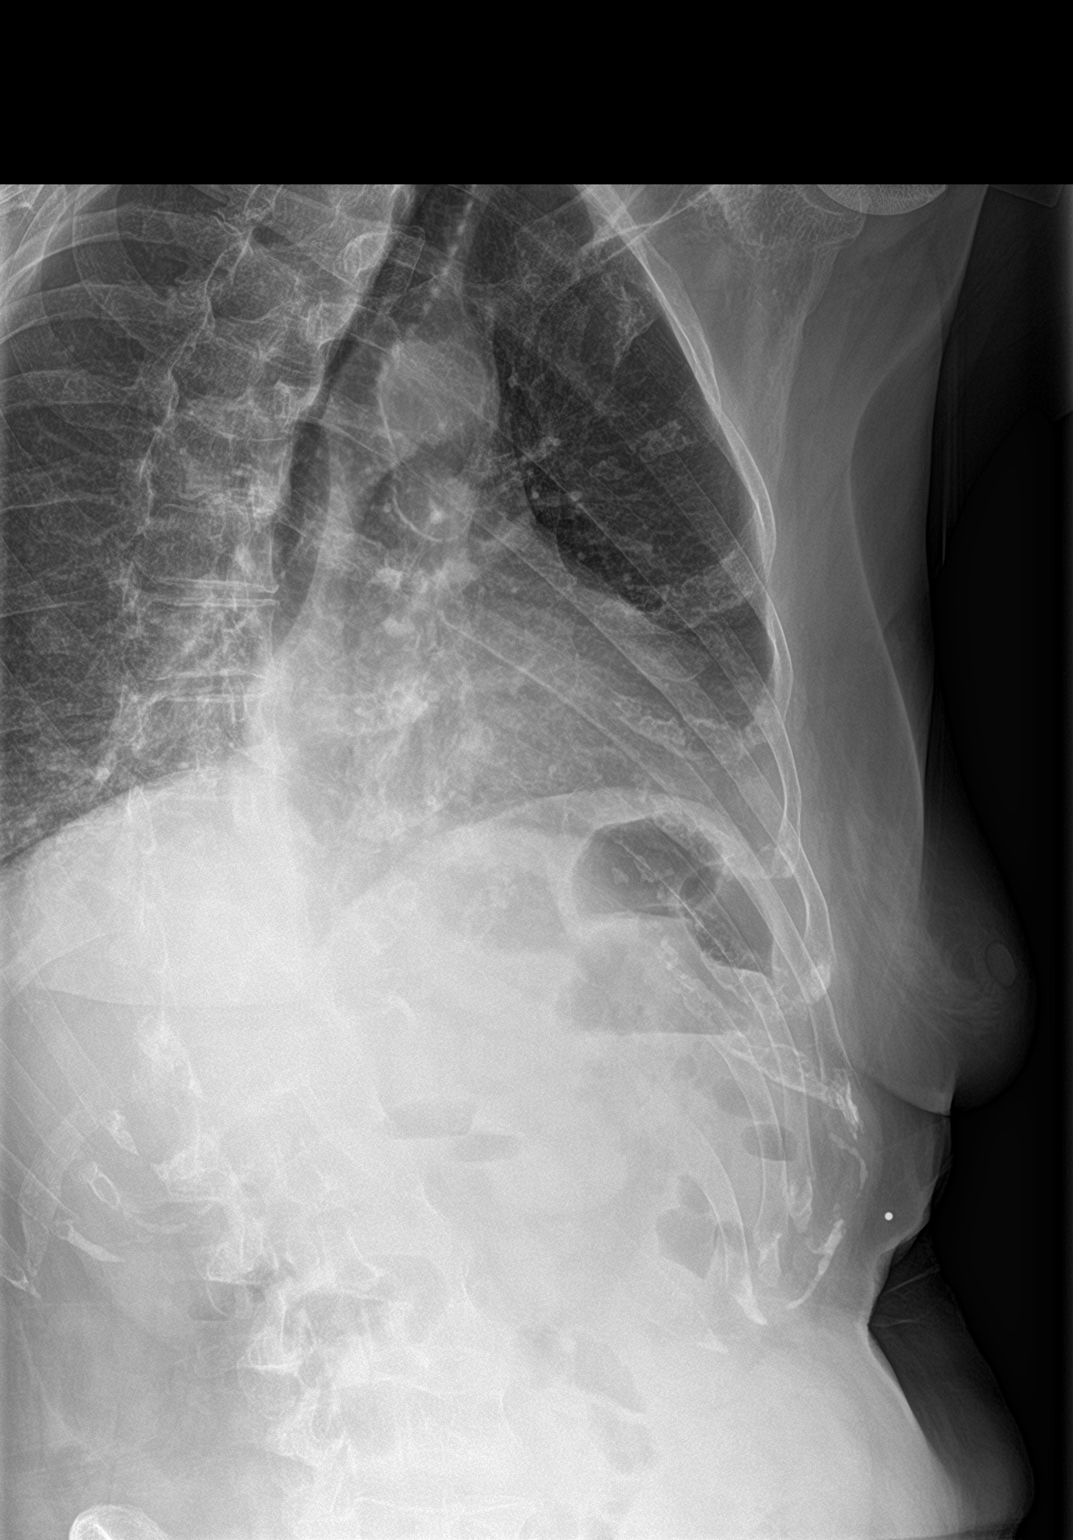

[2 of 2 positions shown; findings below may reference images not displayed]

FINDINGS: Two view radiograph the left ribs demonstrates no acute fracture or
destructive osseous lesion. Minimal left basilar atelectasis or
scarring. The visualized lungs are otherwise clear. No pneumothorax
or pleural effusion on the left.
IMPRESSION: Negative.

## 2019-08-31 IMAGING — DX DG HIP (WITH OR WITHOUT PELVIS) 2-3V*L*
3 series · 3 of 3 positions shown · non-contrast
Comparison: None.

CLINICAL DATA: Left hip pain, left leg numbness

EXAM:
DG HIP (WITH OR WITHOUT PELVIS) 2-3V LEFT

[pelvis ap]
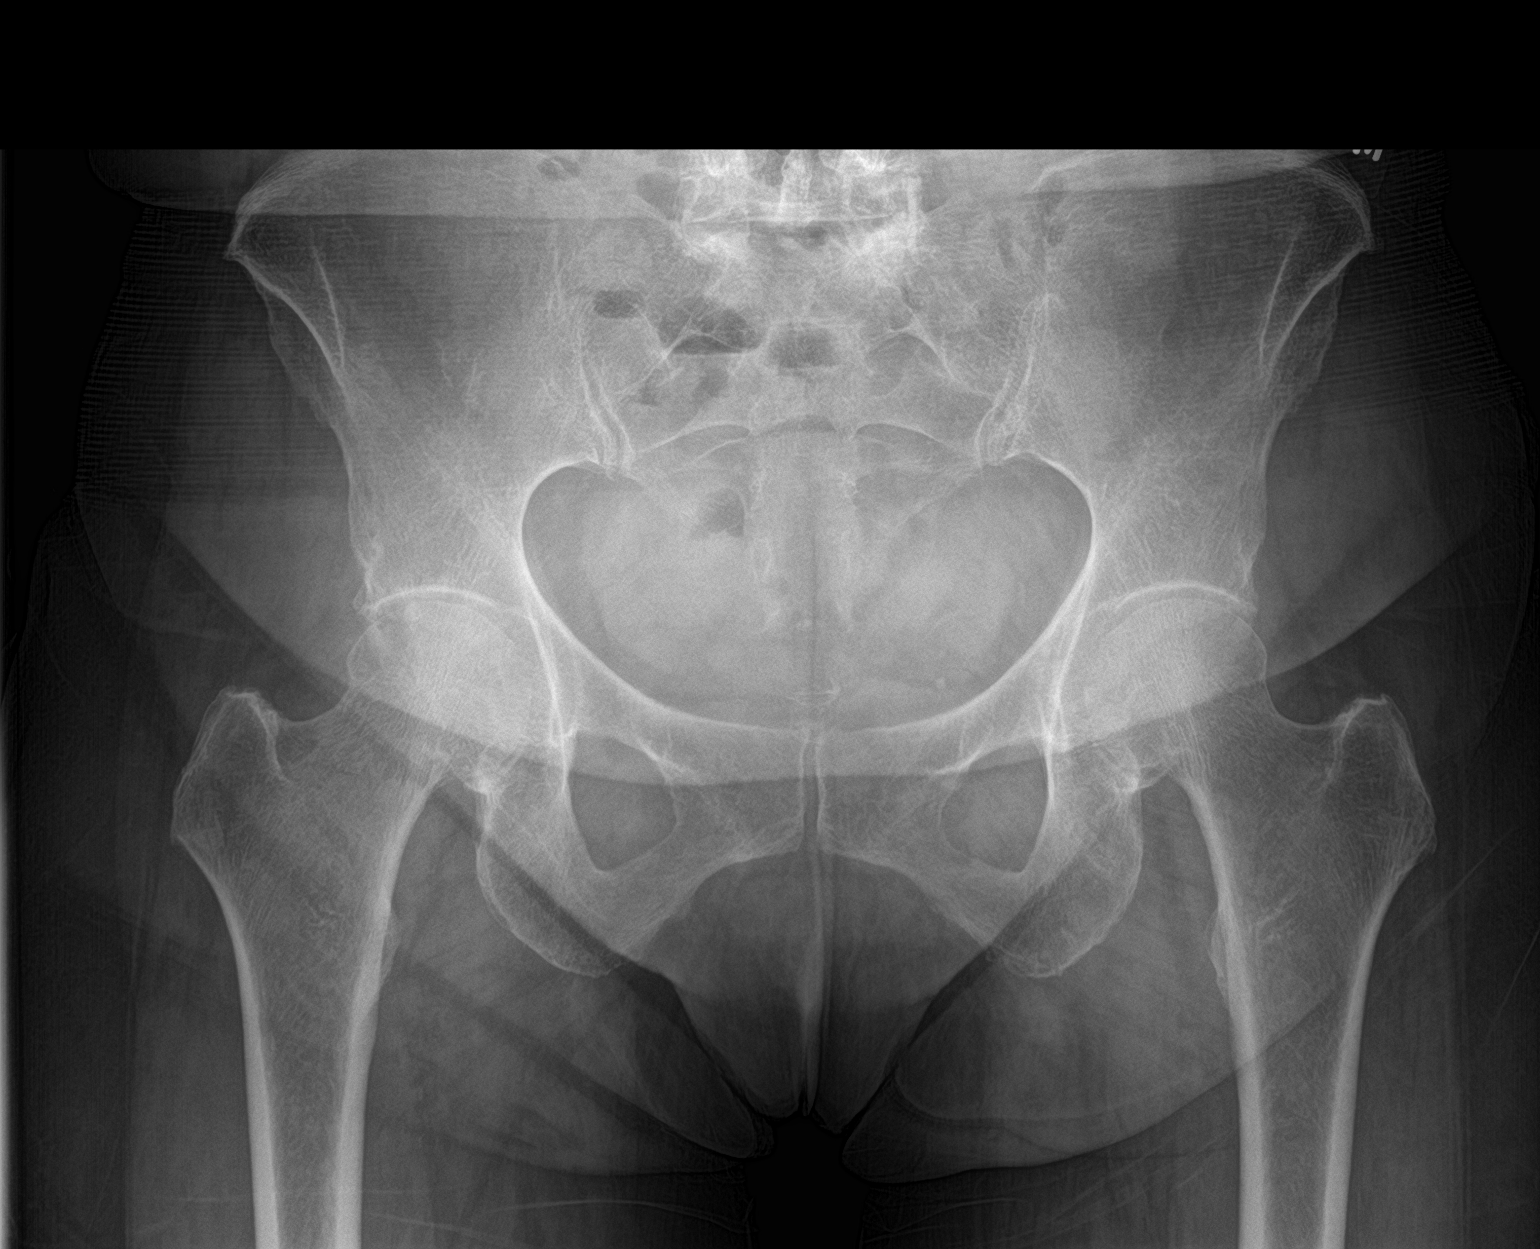

[hip ap]
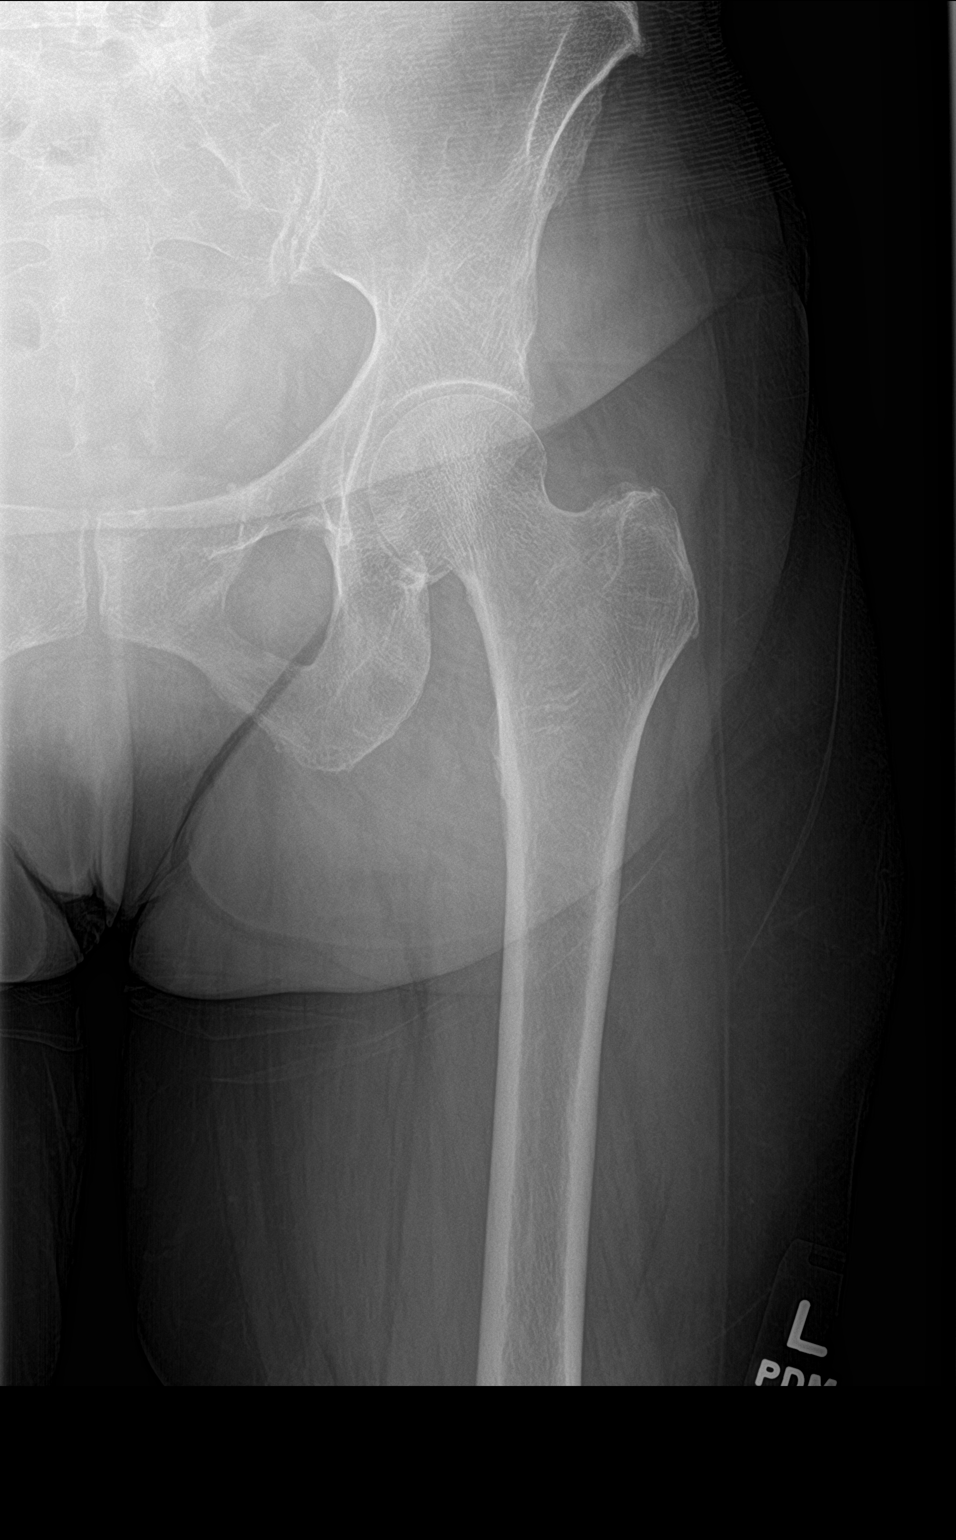

[hip lat]
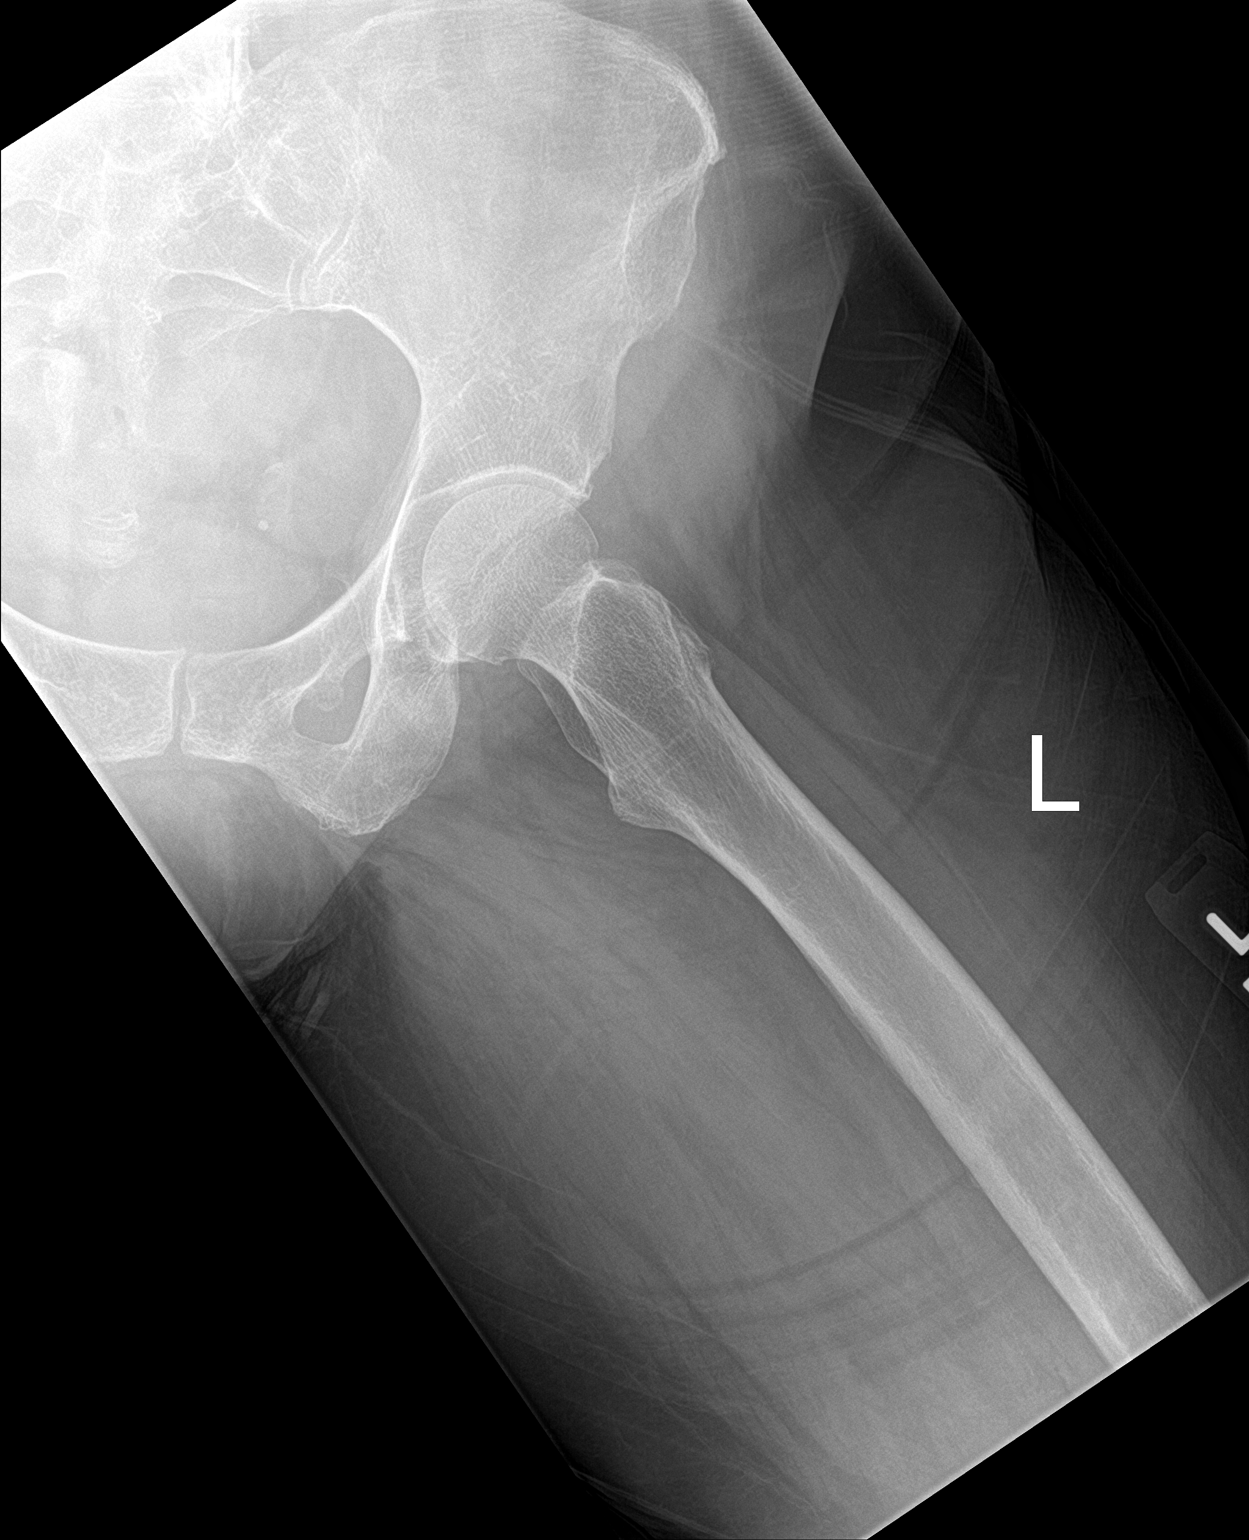

[3 of 3 positions shown; findings below may reference images not displayed]

FINDINGS: Single view radiograph pelvis and two view radiograph left hip
demonstrates normal alignment. No fracture or dislocation. Mild
bilateral degenerative hip arthritis with joint space narrowing.
Soft tissues are unremarkable.
IMPRESSION: Mild degenerative change.  No fracture or dislocation.

## 2019-08-31 MED ORDER — CYANOCOBALAMIN 1000 MCG/ML IJ SOLN: INTRAMUSCULAR | 1 refills | Status: DC

## 2019-08-31 NOTE — Progress Notes (Signed)
Established Patient Office Visit  Subjective:  Patient ID: Jill Salinas, female    DOB: [birth year]  Age: 74 y.o. MRN: [id number]  CC:  Chief Complaint  Patient presents with  . Leg Pain    hit left hip about 6 months ago and has been radiating down to knee   . Chest Pain    left rib cage hurts and feels swollen sometimes     HPI Jill Salinas presents for vague pain around the left hip region with radiation down to the knee left lower extremity.  This is been going on for at least 6 months.  Denies any specific injury.  Symptoms are somewhat intermittent.  She describes a somewhat of a "burning "and sometimes sharp pain.  She is followed by chronic pain management up in [place] and apparently takes Percocet regularly.  She had tried things like gabapentin, nortriptyline, and Lyrica in the past but cannot tolerate any those medications.  She had MRI lumbar spine 07/31/2018 which was reviewed which showed shallow disc bulge L4-L5 without any nerve root impingement or significant stenosis.  Mild to moderate facet arthropathy L5-S1 on the left.  Minimal disc bulge at that level with no stenosis.  Denies any recent urine or stool incontinence.  Ambulating without much difficulty.  She apparently had some kind of injection presumably epidurals up in [place] which have not made much difference.  She states several months ago she ran into a piece of furniture in her home and has had some hip pain since then.  Does not describe any claudication symptoms.  She has had some chronic lower extremity edema and uses compression garments for that.  She is on chronic Cymbalta as well as the Percocet per pain management up in [place].  Denies any recent appetite or weight changes.  Also describing somewhat vague left anterior rib cage pain for several months.  No pleuritic pain.  No cough.  No visible bruising or swelling.  Chronic problems include history of migraine headaches, recurrent depression, chronic  back pain.  She states she has had past history of breast cancer many years ago.  Past Medical History:  Diagnosis Date  . Bowel obstruction ([place])   . Breast cancer ([place])   . Headache(784.0)   . Migraine   . Vision abnormalities    glaucoma    Past Surgical History:  Procedure Laterality Date  . BREAST LUMPECTOMY    . TOTAL ABDOMINAL HYSTERECTOMY N/A     Family History  Problem Relation Age of Onset  . Heart disease Mother   . Headache Mother   . Heart attack Father     Social History   Socioeconomic History  . Marital status: Married    Spouse name: Gwyndolyn Saxon   . Number of children: 0  . Years of education: Assoc  . Highest education level: Not on file  Occupational History  . Occupation: retired  Tobacco Use  . Smoking status: Never Smoker  . Smokeless tobacco: Never Used  Vaping Use  . Vaping Use: Never used  Substance and Sexual Activity  . Alcohol use: Yes    Alcohol/week: 2.0 standard drinks    Types: 2 Standard drinks or equivalent per week    Comment: drinks on the weekend  . Drug use: No  . Sexual activity: Not on file  Other Topics Concern  . Not on file  Social History Narrative   Patient lives at home with her husband. Gwyndolyn Saxon    Patient has  an Associates degree.    Patient has no children.    Patient is right handed.    Social Determinants of Health   Financial Resource Strain:   . Difficulty of Paying Living Expenses:   Food Insecurity:   . Worried About Charity fundraiser in the Last Year:   . Arboriculturist in the Last Year:   Transportation Needs:   . Film/video editor (Medical):   Marland Kitchen Lack of Transportation (Non-Medical):   Physical Activity:   . Days of Exercise per Week:   . Minutes of Exercise per Session:   Stress:   . Feeling of Stress :   Social Connections:   . Frequency of Communication with Friends and Family:   . Frequency of Social Gatherings with Friends and Family:   . Attends Religious Services:   . Active  Member of Clubs or Organizations:   . Attends Archivist Meetings:   Marland Kitchen Marital Status:   Intimate Partner Violence:   . Fear of Current or Ex-Partner:   . Emotionally Abused:   Marland Kitchen Physically Abused:   . Sexually Abused:     Outpatient Medications Prior to Visit  Medication Sig Dispense Refill  . Buprenorphine 15 MCG/HR PTWK as needed.    . butalbital-acetaminophen-caffeine (FIORICET) 50-325-40 MG tablet Take one tablet daily as needed for acute migraine. 12 tablets must last 30 days. No early refills allowed. 12 tablet 5  . denosumab (PROLIA) 60 MG/ML SOLN injection Inject 60 mg into the skin every 6 (six) months. Administer in upper arm, thigh, or abdomen    . diclofenac sodium (VOLTAREN) 1 % GEL Apply 2 g topically as needed.    . DULoxetine (CYMBALTA) 60 MG capsule Take 60 mg by mouth daily.    Marland Kitchen FLUoxetine (PROZAC) 40 MG capsule Take 40 mg by mouth daily.    . folic acid (FOLVITE) 1 MG tablet TOME UNA TABLETA TODOS LOS DIAS 90 tablet 3  . Galcanezumab-gnlm (EMGALITY) 120 MG/ML SOAJ Inject 120 mg into the skin every 30 (thirty) days. 1 pen 11  . LINZESS 290 MCG CAPS capsule TAKE 1 CAPSULE (290 MCG TOTAL) BY MOUTH DAILY BEFORE BREAKFAST. 30 capsule 2  . LUMIGAN 0.01 % SOLN Place 1 drop into both eyes at bedtime.    . Misc Natural Products (LEG VEIN & CIRCULATION PO) Take by mouth.    . modafinil (PROVIGIL) 200 MG tablet Take 200 mg by mouth daily as needed.   1  . Multiple Vitamins-Minerals (ONE-A-DAY VITACRAVES IMMUNITY PO) Take by mouth.    . ondansetron (ZOFRAN) 4 MG tablet TAKE 1 TABLET BY MOUTH EVERY 8 HOURS AS NEEDED 20 tablet 0  . oxyCODONE-acetaminophen (PERCOCET) 10-325 MG tablet Filled by pain clinic  0  . polyethylene glycol (MIRALAX / GLYCOLAX) packet Take 17 g by mouth daily.    . propranolol ER (INDERAL LA) 60 MG 24 hr capsule Take 1 capsule (60 mg total) by mouth daily. 90 capsule 3  . SUMAtriptan (IMITREX) 100 MG tablet May repeat in 2 hours if headache  persists or recurs. Max 2 tablets in 24 hours. 9 tablet 6  . thiamine 100 MG tablet TAKE 1 TABLET BY MOUTH EVERY DAY 90 tablet 3  . tretinoin (RETIN-A) 0.025 % cream Apply 1 application topically at bedtime. 45 g 1  . triamcinolone cream (KENALOG) 0.1 % APPLY TO CHIN DAILY AT BEDTIME AS NEEDED 30 g 0  . vitamin B-12 (CYANOCOBALAMIN) 1000 MCG tablet Take 1,000  mcg by mouth daily.    . Vitamin D, Ergocalciferol, (DRISDOL) 1.25 MG (50000 UNIT) CAPS capsule Take 1 capsule (50,000 Units total) by mouth every 7 (seven) days. 15 capsule 3  . zolpidem (AMBIEN) 5 MG tablet Take 5 mg by mouth at bedtime as needed for sleep.    . cyanocobalamin (,VITAMIN B-12,) 1000 MCG/ML injection ADMINISTER 1 ML (1,000 MCG) INTRAMUSCULARLY MONTHLY  10 ML BOTTLE 10 mL 1   No facility-administered medications prior to visit.    Allergies  Allergen Reactions  . Iodinated Diagnostic Agents Other (See Comments)    Tthroat itching Tthroat itching, pt received a 13 hour prep for injection  . Codeine   . Depakote [Divalproex Sodium] Nausea And Vomiting  . Gabapentin Other (See Comments)    Made her "feel crazy"  . Nortriptyline Other (See Comments)    constipation    ROS Review of Systems  Constitutional: Negative for appetite change, chills and fever.  Respiratory: Negative for cough and shortness of breath.   Gastrointestinal: Negative for abdominal pain, blood in stool, diarrhea, nausea and vomiting.  Genitourinary: Negative for dysuria.  Musculoskeletal: Positive for back pain.  Hematological: Negative for adenopathy.      Objective:    Physical Exam Vitals reviewed.  Constitutional:      Appearance: She is well-developed.  Cardiovascular:     Heart sounds: Normal heart sounds.  Pulmonary:     Effort: Pulmonary effort is normal.     Breath sounds: Normal breath sounds.  Abdominal:     Comments: Abdomen is soft and nontender.  She does have some tenderness over her left anterior lower ribs but no  visible overlying swelling  Musculoskeletal:     Comments: She has compression hose on both lower extremities  She has good range of motion left hip.  Minimal tenderness left greater trochanteric bursa  Neurological:     Mental Status: She is alert.     Comments: Straight leg raise is negative on the left.  She has 2+ reflex ankle and knee bilaterally.  Good strength with plantarflexion and dorsiflexion of the left lower extremity     BP 130/72 (BP Location: Left Arm, Patient Position: Sitting, Cuff Size: Normal)   Pulse 95   Temp 98.1 F (36.7 C) (Oral)   Wt 130 lb (59 kg)   SpO2 94%   BMI 25.39 kg/m  Wt Readings from Last 3 Encounters:  08/31/19 130 lb (59 kg)  08/09/19 129 lb 3.2 oz (58.6 kg)  06/22/19 132 lb (59.9 kg)     Health Maintenance Due  Topic Date Due  . Hepatitis C Screening  Never done  . COVID-19 Vaccine (1) Never done    There are no preventive care reminders to display for this patient.  Lab Results  Component Value Date   TSH 3.49 09/16/2018   Lab Results  Component Value Date   WBC 7.0 09/16/2018   HGB 12.4 09/16/2018   HCT 37.8 09/16/2018   MCV 102.5 (H) 09/16/2018   PLT 307.0 09/16/2018    No results found for: CHOL No results found for: HDL No results found for: LDLCALC No results found for: TRIG No results found for: CHOLHDL Lab Results  Component Value Date   HGBA1C 5.2 06/22/2019      Assessment & Plan:   #1 vague somewhat poorly localized left hip pains with radiation down left lower extremity.  Suspect her pain is more radicular in nature.  She is already followed by pain  management up in [place].  Doubt hip joint pathology  -Pain x-ray left hip to further assess -She unfortunately has not tolerated several medications for neuropathic pain such as gabapentin, Lyrica, or nortriptyline.  She is already on Cymbalta and fairly high-dose opioids per pain management.  She is encouraged to continue close follow-up with her pain  management specialist  #2 left lower anterior rib cage pain.  No reported injury.  Duration of several months  -Pain x-rays left anterior ribs  #3 history of low B12 levels.  Patient requesting refills of B12  Meds ordered this encounter  Medications  . cyanocobalamin (,VITAMIN B-12,) 1000 MCG/ML injection    Sig: ADMINISTER 1 ML (1,000 MCG) INTRAMUSCULARLY MONTHLY  10 ML BOTTLE    Dispense:  10 mL    Refill:  1    Follow-up: No follow-ups on file.    Carolann Littler, MD

## 2019-08-31 NOTE — Patient Instructions (Signed)
Radicular Pain Radicular pain is a type of pain that spreads from your back or neck along a spinal nerve. Spinal nerves are nerves that leave the spinal cord and go to the muscles. Radicular pain is sometimes called radiculopathy, radiculitis, or a pinched nerve. When you have this type of pain, you may also have weakness, numbness, or tingling in the area of your body that is supplied by the nerve. The pain may feel sharp and burning. Depending on which spinal nerve is affected, the pain may occur in the:  Neck area (cervical radicular pain). You may also feel pain, numbness, weakness, or tingling in the arms.  Mid-spine area (thoracic radicular pain). You would feel this pain in the back and chest. This type is rare.  Lower back area (lumbar radicular pain). You would feel this pain as low back pain. You may feel pain, numbness, weakness, or tingling in the buttocks or legs. Sciatica is a type of lumbar radicular pain that shoots down the back of the leg. Radicular pain occurs when one of the spinal nerves becomes irritated or squeezed (compressed). It is often caused by something pushing on a spinal nerve, such as one of the bones of the spine (vertebrae) or one of the round cushions between vertebrae (intervertebral disks). This can result from:  An injury.  Wear and tear or aging of a disk.  The growth of a bone spur that pushes on the nerve. Radicular pain often goes away when you follow instructions from your health care provider for relieving pain at home. Follow these instructions at home: Managing pain      If directed, put ice on the affected area: ? Put ice in a plastic bag. ? Place a towel between your skin and the bag. ? Leave the ice on for 20 minutes, 2-3 times a day.  If directed, apply heat to the affected area as often as told by your health care provider. Use the heat source that your health care provider recommends, such as a moist heat pack or a heating pad. ? Place  a towel between your skin and the heat source. ? Leave the heat on for 20-30 minutes. ? Remove the heat if your skin turns bright red. This is especially important if you are unable to feel pain, heat, or cold. You may have a greater risk of getting burned. Activity   Do not sit or rest in bed for long periods of time.  Try to stay as active as possible. Ask your health care provider what type of exercise or activity is best for you.  Avoid activities that make your pain worse, such as bending and lifting.  Do not lift anything that is heavier than 10 lb (4.5 kg), or the limit that you are told, until your health care provider says that it is safe.  Practice using proper technique when lifting items. Proper lifting technique involves bending your knees and rising up.  Do strength and range-of-motion exercises only as told by your health care provider or physical therapist. General instructions  Take over-the-counter and prescription medicines only as told by your health care provider.  Pay attention to any changes in your symptoms.  Keep all follow-up visits as told by your health care provider. This is important. ? Your health care provider may send you to a physical therapist to help with this pain. Contact a health care provider if:  Your pain and other symptoms get worse.  Your pain medicine is not   helping.  Your pain has not improved after a few weeks of home care.  You have a fever. Get help right away if:  You have severe pain, weakness, or numbness.  You have difficulty with bladder or bowel control. Summary  Radicular pain is a type of pain that spreads from your back or neck along a spinal nerve.  When you have radicular pain, you may also have weakness, numbness, or tingling in the area of your body that is supplied by the nerve.  The pain may feel sharp or burning.  Radicular pain may be treated with ice, heat, medicines, or physical therapy. This  information is not intended to replace advice given to you by your health care provider. Make sure you discuss any questions you have with your health care provider. Document Revised: 08/12/2017 Document Reviewed: 08/12/2017 Elsevier Patient Education  2020 Elsevier Inc.  

## 2019-09-03 ENCOUNTER — Other Ambulatory Visit: Payer: Self-pay | Admitting: Neurology

## 2019-09-12 ENCOUNTER — Other Ambulatory Visit: Payer: Self-pay | Admitting: Internal Medicine

## 2019-10-01 ENCOUNTER — Telehealth (INDEPENDENT_AMBULATORY_CARE_PROVIDER_SITE_OTHER): Payer: Medicare Other | Admitting: Internal Medicine

## 2019-10-01 ENCOUNTER — Other Ambulatory Visit: Payer: Self-pay

## 2019-10-01 DIAGNOSIS — M79605 Pain in left leg: Secondary | ICD-10-CM

## 2019-10-01 DIAGNOSIS — M79604 Pain in right leg: Secondary | ICD-10-CM

## 2019-10-01 NOTE — Progress Notes (Signed)
Virtual Visit via Telephone Note  I connected with Jill Salinas on 10/01/19 at 11:30 AM EDT by telephone and verified that I am speaking with the correct person using two identifiers.   I discussed the limitations, risks, security and privacy concerns of performing an evaluation and management service by telephone and the availability of in person appointments. I also discussed with the patient that there may be a patient responsible charge related to this service. The patient expressed understanding and agreed to proceed.  Location patient: home Location provider: work office Participants present for the call: patient, provider Patient did not have a visit in the prior 7 days to address this/these issue(s).   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  She has burning, tingling type pain of both soles of her feet and both legs, worse on the left.  She has tried medications like gabapentin, Lyrica and amitriptyline in the past but cannot tolerate them.  She states "they make me crazy in the head", make her hyper to the point where she is not able to carry a conversation.  She is followed by chronic pain management specialist in [place] and is on a relatively high doses of Percocet.  She has a neurologist that she sees mainly for migraines.  She had an MRI of her lumbar spine in June 2020 that showed a shallow disc bulge at the L4-L5 level without significant stenosis or nerve root impingement, mild to moderate facet arthropathy L5-S1 on the left with minimal disc bulge at that level without stenosis.  She states she has tried "back injections" which I can only imagine are epidural injections without any significant relief.  She states "I have lost my life to this pain", she finds it difficult to wake up in the mornings because of this.   Observations/Objective: Patient sounds cheerful and well on the phone. I do not appreciate any increased  work of breathing. Speech and thought processing are grossly intact. Patient reported vitals: None reported   Current Outpatient Medications:  .  Buprenorphine 15 MCG/HR PTWK, as needed., Disp: , Rfl:  .  butalbital-acetaminophen-caffeine (FIORICET) 50-325-40 MG tablet, Take one tablet daily as needed for acute migraine. 12 tablets must last 30 days. No early refills allowed., Disp: 12 tablet, Rfl: 5 .  cyanocobalamin (,VITAMIN B-12,) 1000 MCG/ML injection, ADMINISTER 1 ML (1,000 MCG) INTRAMUSCULARLY MONTHLY  10 ML BOTTLE, Disp: 10 mL, Rfl: 1 .  denosumab (PROLIA) 60 MG/ML SOLN injection, Inject 60 mg into the skin every 6 (six) months. Administer in upper arm, thigh, or abdomen, Disp: , Rfl:  .  diclofenac sodium (VOLTAREN) 1 % GEL, Apply 2 g topically as needed., Disp: , Rfl:  .  DULoxetine (CYMBALTA) 60 MG capsule, Take 60 mg by mouth daily., Disp: , Rfl:  .  FLUoxetine (PROZAC) 40 MG capsule, Take 40 mg by mouth daily., Disp: , Rfl:  .  folic acid (FOLVITE) 1 MG tablet, TOME UNA TABLETA TODOS LOS DIAS, Disp: 90 tablet, Rfl: 3 .  Galcanezumab-gnlm (EMGALITY) 120 MG/ML SOAJ, Inject 120 mg into the skin every 30 (thirty) days., Disp: 1 pen, Rfl: 11 .  LINZESS 290 MCG CAPS capsule, TAKE 1 CAPSULE BY MOUTH DAILY BEFORE BREAKFAST., Disp: 30 capsule, Rfl: 2 .  LUMIGAN 0.01 % SOLN, Place 1 drop into both eyes at bedtime., Disp: , Rfl:  .  Misc Natural Products (LEG VEIN & CIRCULATION PO), Take by mouth., Disp: ,  Rfl:  .  modafinil (PROVIGIL) 200 MG tablet, Take 200 mg by mouth daily as needed. , Disp: , Rfl: 1 .  Multiple Vitamins-Minerals (ONE-A-DAY VITACRAVES IMMUNITY PO), Take by mouth., Disp: , Rfl:  .  ondansetron (ZOFRAN) 4 MG tablet, TAKE 1 TABLET BY MOUTH EVERY 8 HOURS AS NEEDED, Disp: 20 tablet, Rfl: 0 .  oxyCODONE-acetaminophen (PERCOCET) 10-325 MG tablet, Filled by pain clinic, Disp: , Rfl: 0 .  polyethylene glycol (MIRALAX / GLYCOLAX) packet, Take 17 g by mouth daily., Disp: , Rfl:  .   propranolol ER (INDERAL LA) 60 MG 24 hr capsule, Take 1 capsule (60 mg total) by mouth daily., Disp: 90 capsule, Rfl: 3 .  SUMAtriptan (IMITREX) 100 MG tablet, May repeat in 2 hours if headache persists or recurs. Max 2 tablets in 24 hours., Disp: 9 tablet, Rfl: 6 .  thiamine 100 MG tablet, TAKE 1 TABLET BY MOUTH EVERY DAY, Disp: 90 tablet, Rfl: 3 .  tretinoin (RETIN-A) 0.025 % cream, Apply 1 application topically at bedtime., Disp: 45 g, Rfl: 1 .  triamcinolone cream (KENALOG) 0.1 %, APPLY TO CHIN DAILY AT BEDTIME AS NEEDED, Disp: 30 g, Rfl: 0 .  vitamin B-12 (CYANOCOBALAMIN) 1000 MCG tablet, Take 1,000 mcg by mouth daily., Disp: , Rfl:  .  Vitamin D, Ergocalciferol, (DRISDOL) 1.25 MG (50000 UNIT) CAPS capsule, Take 1 capsule (50,000 Units total) by mouth every 7 (seven) days., Disp: 15 capsule, Rfl: 3 .  zolpidem (AMBIEN) 5 MG tablet, Take 5 mg by mouth at bedtime as needed for sleep., Disp: , Rfl:   Review of Systems:  Constitutional: Denies fever, chills, diaphoresis, appetite change and fatigue.  HEENT: Denies photophobia, eye pain, redness, hearing loss, ear pain, congestion, sore throat, rhinorrhea, sneezing, mouth sores, trouble swallowing, neck pain, neck stiffness and tinnitus.   Respiratory: Denies SOB, DOE, cough, chest tightness,  and wheezing.   Cardiovascular: Denies chest pain, palpitations and leg swelling.  Gastrointestinal: Denies nausea, vomiting, abdominal pain, diarrhea, constipation, blood in stool and abdominal distention.  Genitourinary: Denies dysuria, urgency, frequency, hematuria, flank pain and difficulty urinating.  Endocrine: Denies: hot or cold intolerance, sweats, changes in hair or nails, polyuria, polydipsia. Musculoskeletal: Denies myalgias, joint swelling, arthralgias. Skin: Denies pallor, rash and wound.  Neurological: Denies dizziness, seizures, syncope, weakness, light-headedness. Hematological: Denies adenopathy. Easy bruising, personal or family bleeding  history  Psychiatric/Behavioral: Denies suicidal ideation, mood changes, confusion, nervousness, sleep disturbance and agitation   Assessment and Plan:  Pain in both lower extremities -Not clear what other options we have for her at this point. -Sounds neuropathic in nature. -She has already followed by pain management and is on a relatively high opioid doses. -She has not tolerated several medications for neuropathic pain including gabapentin, Lyrica and amitriptyline.  She is also already on Cymbalta. -Have advised to reach out to neurology to see if they have any further recommendations.   I discussed the assessment and treatment plan with the patient. The patient was provided an opportunity to ask questions and all were answered. The patient agreed with the plan and demonstrated an understanding of the instructions.   The patient was advised to call back or seek an in-person evaluation if the symptoms worsen or if the condition fails to improve as anticipated.  I provided 16 minutes of non-face-to-face time during this encounter.   Lelon Frohlich, MD [facility]

## 2019-10-22 ENCOUNTER — Other Ambulatory Visit: Payer: Self-pay

## 2019-10-22 ENCOUNTER — Encounter: Payer: Self-pay | Admitting: Family Medicine

## 2019-10-22 ENCOUNTER — Ambulatory Visit (INDEPENDENT_AMBULATORY_CARE_PROVIDER_SITE_OTHER): Payer: Medicare Other | Admitting: Family Medicine

## 2019-10-22 VITALS — BP 110/70 | HR 71 | Temp 98.7°F | Ht 60.0 in | Wt 132.0 lb

## 2019-10-22 DIAGNOSIS — L299 Pruritus, unspecified: Secondary | ICD-10-CM

## 2019-10-22 DIAGNOSIS — K5909 Other constipation: Secondary | ICD-10-CM | POA: Diagnosis not present

## 2019-10-22 DIAGNOSIS — R109 Unspecified abdominal pain: Secondary | ICD-10-CM

## 2019-10-22 DIAGNOSIS — Z1211 Encounter for screening for malignant neoplasm of colon: Secondary | ICD-10-CM | POA: Diagnosis not present

## 2019-10-22 MED ORDER — FLUOCINOLONE ACETONIDE 0.01 % EX SHAM: 1.0000 "application " | MEDICATED_SHAMPOO | Freq: Every day | CUTANEOUS | 2 refills | Status: DC | PRN

## 2019-10-22 NOTE — Progress Notes (Signed)
Established Patient Office Visit  Subjective:  Patient ID: Jill Salinas, female    DOB: 07-23-1945  Age: 74 y.o. MRN: [id number]  CC:  Chief Complaint  Patient presents with   Abdominal Pain    Pt c/o abdominal pain. pt stated it was swollen and now hows gone down.     HPI Jill Salinas presents for the following issues  She states she has had pruritic scalp for the past several weeks.  Denies any haircoloring treatments.  No recent change of shampoo.  She tried Nizoral and T-Gel type shampoo without much improvement.  Her pruritus is especially bothersome at night.  She has noticed some mild scaling.  No skin rashes noted.  Her initial complaint was some pain radiating from the left lower rib cage area all the way down to the lower abdomen recently.  She apparently had similar symptoms in the past.  She states that she felt somewhat swollen and distended.  Her symptoms are actually improved and almost resolved today.  She has had some chronic constipation issues and takes Linzess.  She is on chronic opioids and followed by pain management in [facility].  She had CT scan abdomen pelvis 7/20 which showed no acute findings.  No recent nausea or vomiting.  No fever.  No urinary symptoms.  She states her bowel movements are actually fairly stable currently with Linzess and stool softener.  She states she has never had colonoscopy or any sort of colon cancer screening.  Past Medical History:  Diagnosis Date   Bowel obstruction ([place])    Breast cancer ([place])    Headache(784.0)    Migraine    Vision abnormalities    glaucoma    Past Surgical History:  Procedure Laterality Date   BREAST LUMPECTOMY     TOTAL ABDOMINAL HYSTERECTOMY N/A     Family History  Problem Relation Age of Onset   Heart disease Mother    Headache Mother    Heart attack Father     Social History   Socioeconomic History   Marital status: Married    Spouse name: Gwyndolyn Saxon    Number of  children: 0   Years of education: Assoc   Highest education level: Not on file  Occupational History   Occupation: retired  Tobacco Use   Smoking status: Never Smoker   Smokeless tobacco: Never Used  Scientific laboratory technician Use: Never used  Substance and Sexual Activity   Alcohol use: Yes    Alcohol/week: 2.0 standard drinks    Types: 2 Standard drinks or equivalent per week    Comment: drinks on the weekend   Drug use: No   Sexual activity: Not on file  Other Topics Concern   Not on file  Social History Narrative   Patient lives at home with her husband. Gwyndolyn Saxon    Patient has an Geophysicist/field seismologist.    Patient has no children.    Patient is right handed.    Social Determinants of Health   Financial Resource Strain:    Difficulty of Paying Living Expenses: Not on file  Food Insecurity:    Worried About Charity fundraiser in the Last Year: Not on file   [employer] of Food in the Last Year: Not on file  Transportation Needs:    Lack of Transportation (Medical): Not on file   Lack of Transportation (Non-Medical): Not on file  Physical Activity:    Days of Exercise per Week: Not on  file   Minutes of Exercise per Session: Not on file  Stress:    Feeling of Stress : Not on file  Social Connections:    Frequency of Communication with Friends and Family: Not on file   Frequency of Social Gatherings with Friends and Family: Not on file   Attends Religious Services: Not on file   Active Member of Clubs or Organizations: Not on file   Attends Archivist Meetings: Not on file   Marital Status: Not on file  Intimate Partner Violence:    Fear of Current or Ex-Partner: Not on file   Emotionally Abused: Not on file   Physically Abused: Not on file   Sexually Abused: Not on file    Outpatient Medications Prior to Visit  Medication Sig Dispense Refill   Buprenorphine 15 MCG/HR [place] as needed.     butalbital-acetaminophen-caffeine (FIORICET)  50-325-40 MG tablet Take one tablet daily as needed for acute migraine. 12 tablets must last 30 days. No early refills allowed. 12 tablet 5   cyanocobalamin (,VITAMIN B-12,) 1000 MCG/ML injection ADMINISTER 1 ML (1,000 MCG) INTRAMUSCULARLY MONTHLY  10 ML BOTTLE 10 mL 1   denosumab (PROLIA) 60 MG/ML SOLN injection Inject 60 mg into the skin every 6 (six) months. Administer in upper arm, thigh, or abdomen     diclofenac sodium (VOLTAREN) 1 % GEL Apply 2 g topically as needed.     DULoxetine (CYMBALTA) 60 MG capsule Take 60 mg by mouth daily.     FLUoxetine (PROZAC) 40 MG capsule Take 40 mg by mouth daily.     folic acid (FOLVITE) 1 MG tablet TOME UNA TABLETA TODOS LOS DIAS 90 tablet 3   Galcanezumab-gnlm (EMGALITY) 120 MG/ML SOAJ Inject 120 mg into the skin every 30 (thirty) days. 1 pen 11   LINZESS 290 MCG CAPS capsule TAKE 1 CAPSULE BY MOUTH DAILY BEFORE BREAKFAST. 30 capsule 2   LUMIGAN 0.01 % SOLN Place 1 drop into both eyes at bedtime.     Misc Natural Products (LEG VEIN & CIRCULATION PO) Take by mouth.     modafinil (PROVIGIL) 200 MG tablet Take 200 mg by mouth daily as needed.   1   Multiple Vitamins-Minerals (ONE-A-DAY VITACRAVES IMMUNITY PO) Take by mouth.     ondansetron (ZOFRAN) 4 MG tablet TAKE 1 TABLET BY MOUTH EVERY 8 HOURS AS NEEDED 20 tablet 0   oxyCODONE-acetaminophen (PERCOCET) 10-325 MG tablet Filled by pain clinic  0   polyethylene glycol (MIRALAX / GLYCOLAX) packet Take 17 g by mouth daily.     propranolol ER (INDERAL LA) 60 MG 24 hr capsule Take 1 capsule (60 mg total) by mouth daily. 90 capsule 3   SUMAtriptan (IMITREX) 100 MG tablet May repeat in 2 hours if headache persists or recurs. Max 2 tablets in 24 hours. 9 tablet 6   thiamine 100 MG tablet TAKE 1 TABLET BY MOUTH EVERY DAY 90 tablet 3   tretinoin (RETIN-A) 0.025 % cream Apply 1 application topically at bedtime. 45 g 1   triamcinolone cream (KENALOG) 0.1 % APPLY TO CHIN DAILY AT BEDTIME AS NEEDED 30  g 0   vitamin B-12 (CYANOCOBALAMIN) 1000 MCG tablet Take 1,000 mcg by mouth daily.     Vitamin D, Ergocalciferol, (DRISDOL) 1.25 MG (50000 UNIT) CAPS capsule Take 1 capsule (50,000 Units total) by mouth every 7 (seven) days. 15 capsule 3   zolpidem (AMBIEN) 5 MG tablet Take 5 mg by mouth at bedtime as needed for sleep.  No facility-administered medications prior to visit.    Allergies  Allergen Reactions   Iodinated Diagnostic Agents Other (See Comments)    Tthroat itching Tthroat itching, pt received a 13 hour prep for injection   Codeine    Depakote [Divalproex Sodium] Nausea And Vomiting   Gabapentin Other (See Comments)    Made her "feel crazy"   Nortriptyline Other (See Comments)    constipation    ROS Review of Systems  Constitutional: Negative for chills and fever.  Respiratory: Negative for cough and shortness of breath.   Cardiovascular: Negative for chest pain.  Gastrointestinal: Positive for constipation. Negative for blood in stool, diarrhea, nausea and vomiting.  Genitourinary: Negative for dysuria.      Objective:    Physical Exam Vitals reviewed.  Constitutional:      Appearance: She is well-developed.  Abdominal:     Comments: Abdomen is nondistended.  Normal bowel sounds.  Soft and nontender.  No masses palpated.  Skin:    Comments: Examined.  She has just some very minimal flaking.  No visible rash  Neurological:     Mental Status: She is alert.     BP 110/70    Pulse 71    Temp 98.7 F (37.1 C) (Oral)    Ht 5' (1.524 m)    Wt 132 lb (59.9 kg)    SpO2 97%    BMI 25.78 kg/m  Wt Readings from Last 3 Encounters:  10/22/19 132 lb (59.9 kg)  08/31/19 130 lb (59 kg)  08/09/19 129 lb 3.2 oz (58.6 kg)     Health Maintenance Due  Topic Date Due   Hepatitis C Screening  Never done   COLONOSCOPY  Never done    There are no preventive care reminders to display for this patient.  Lab Results  Component Value Date   TSH 3.49  09/16/2018   Lab Results  Component Value Date   WBC 7.0 09/16/2018   HGB 12.4 09/16/2018   HCT 37.8 09/16/2018   MCV 102.5 (H) 09/16/2018   PLT 307.0 09/16/2018   Lab Results  Component Value Date   NA 135 06/22/2019   K 4.8 06/22/2019   CO2 28 06/22/2019   GLUCOSE 101 (H) 06/22/2019   BUN 13 06/22/2019   CREATININE 0.75 06/22/2019   BILITOT 0.3 09/16/2018   ALKPHOS 76 09/16/2018   AST 13 09/16/2018   ALT 11 09/16/2018   PROT 7.3 09/16/2018   ALBUMIN 4.4 09/16/2018   CALCIUM 9.9 06/22/2019   ANIONGAP 7 11/21/2015   GFR 75.36 06/22/2019   No results found for: CHOL No results found for: HDL No results found for: LDLCALC No results found for: TRIG No results found for: [facility] Lab Results  Component Value Date   HGBA1C 5.2 06/22/2019      Assessment & Plan:   #1 pruritic scalp.  May have some mild eczema.  No real rash though noted other than some mild flaking and dryness.  She has had no relief with over-the-counter shampoos  -Trial of fluocinolone 0.01% shampoo and use at least twice weekly  #2 chronic intermittent abdominal pain and constipation.  Probably exacerbated by her chronic opioid use.  No evidence for acute bowel obstruction by exam and current abdominal exam is benign.  The symptoms she described as swelling and pain earlier in the week have resolved.  -We recommended prompt follow-up for any fever, recurrent vomiting, progressive abdominal pain, or other concerns  Patient has never had colon cancer screening.  She is willing to consider Cologuard  Meds ordered this encounter  Medications   Fluocinolone Acetonide 0.01 % SHAM    Sig: Apply 1 application topically daily as needed.    Dispense:  120 mL    Refill:  2    Follow-up: No follow-ups on file.    Carolann Littler, MD

## 2019-10-22 NOTE — Patient Instructions (Signed)
We will order the cologuard  Try the medicated scalp shampoo at least two times weekly  Follow up for any fever, vomiting or progressive abdominal pain.

## 2019-11-02 ENCOUNTER — Telehealth: Payer: Self-pay | Admitting: Family Medicine

## 2019-11-02 NOTE — Telephone Encounter (Signed)
Called pt.  She is suffering with migraine since lat Wednesday.  Noted vision and dizziness, then headache,  Dizziness dissipated over the weekend. imitrex not working like it did.  Has nausea (? Taking zofran).  Pain behind R eye, seeing yellow bulbs.  Kevek 8.  On emaglity.  Did not take propronolol until 2 days ago.  Relayed that is preventative, not help acutely.  Please advise.

## 2019-11-02 NOTE — Telephone Encounter (Signed)
I recommend she use propranolol daily as previously directed. She may continue Emgality monthly as well as sumatriptan acutely. If she is taking all medications as prescribed and headaches continue, she should be seen in the office. TY.

## 2019-11-02 NOTE — Telephone Encounter (Addendum)
Pt called, for a week been having a severe migraine. Seeing lights in my eyes, dizzy, teeth hurting. Could you call in something difference. Would like a call from the nurse.

## 2019-11-02 NOTE — Telephone Encounter (Signed)
I attempted to call pt and relay message. Call would not go thru at this time.

## 2019-11-03 ENCOUNTER — Encounter: Payer: Self-pay | Admitting: *Deleted

## 2019-11-03 NOTE — Telephone Encounter (Signed)
Phones are down. I sent her my chart with Sarah's message, advised she let us know if she has questions.

## 2019-11-10 ENCOUNTER — Ambulatory Visit: Payer: Medicare Other | Admitting: Family Medicine

## 2019-11-11 ENCOUNTER — Ambulatory Visit: Payer: Medicare Other | Admitting: Family Medicine

## 2019-11-16 ENCOUNTER — Ambulatory Visit (INDEPENDENT_AMBULATORY_CARE_PROVIDER_SITE_OTHER): Payer: Medicare Other | Admitting: Family Medicine

## 2019-11-16 ENCOUNTER — Encounter: Payer: Self-pay | Admitting: Family Medicine

## 2019-11-16 ENCOUNTER — Other Ambulatory Visit: Payer: Self-pay

## 2019-11-16 VITALS — BP 149/80 | HR 69 | Ht 59.0 in | Wt 132.0 lb

## 2019-11-16 DIAGNOSIS — G43909 Migraine, unspecified, not intractable, without status migrainosus: Secondary | ICD-10-CM | POA: Diagnosis not present

## 2019-11-16 DIAGNOSIS — Z79899 Other long term (current) drug therapy: Secondary | ICD-10-CM

## 2019-11-16 MED ORDER — ELETRIPTAN HYDROBROMIDE 20 MG PO TABS: 20.0000 mg | ORAL_TABLET | ORAL | 11 refills | Status: DC | PRN

## 2019-11-16 NOTE — Progress Notes (Signed)
Jill Salinas is a 74 y.o. female here for a f/u on migraines. Pt said she is still having bad break through migraines. Pt has dizziness and seeing things with her right eye.Marland KitchenPt is seeing yellow bulbs behind her eye. Pt says she has no sense of smell or taste.

## 2019-11-16 NOTE — Progress Notes (Signed)
PATIENT: Jill Salinas DOB: 12/19/1945  REASON FOR VISIT: follow up HISTORY FROM: patient  Chief Complaint  Patient presents with  . Follow-up    rm 5  . Migraine    Pt said she is still having bad break through migraines. Pt has dizziness and seeing things with her right eye.Marland KitchenPt is seeing yellow bulbs behind her eye. Pt says     HISTORY OF PRESENT ILLNESS: Today 11/16/19 Jill Salinas is a 74 y.o. female here today for follow up for miraines. She was last seen 05/2019. We reeducated her on previous plan to start propranolol (had not been started) and continued Emgality and sumatriptan. She called in September to report migraine unresponsive to sumatriptan. She had only taken propranolol for two days according to her phone report. She states that she is now taking it daily. She switched from daytime to nighttime due to concerns of dizziness. Dizziness has resolved since changing dose to night. She reports that this may have helped her headaches some. She took last Emgality injection on 9/29. She has had one migraine since starting propranolol.   She reports multiple concerns today including biting lips, profuse sweating every day around 3-4am and "crazy" movements in abdomen. She also reports dizziness that has been present for 1.5 years. She has been seeing yellow bulbs in her eyes at night. She has glaucoma. She has an appt with ophthalmology this month. She has not seen PCP for these concerns. Unclear how long symptoms have been present.   HISTORY: (copied from my note on 06/10/2019)  Jill Salinas is a 74 y.o. female here today for follow up for chronic migraines. She was previously taking Topamax 200mg  at bedtime, Emgality and was started on propranolol LA 60mg  daily in 10/2018. Today, she reports that she is no longer taking topiramate and doesn't think she started propranolol. She is quite unclear about why. She thinks she threw the propranolol away because she wasn't sure what it was  for. Per telephone note on 10/16, patient had called with concerns of continuing topiramate as it was a "seizure" medication. She was advised to wean topiramate and start nortriptyline. She states that nortriptyline " was a terrible drug". No clear side effects other than she did not like it. Per Dr Rhea Belton last follow up note, she was using Imitrex for abortive therapy but when questioned, she doesn't think she has been taking this. She reports using Aleve for migraine management. She is unable to quantify how much Aleve she is taking. She is followed regularly by pain management on on multiple medications for chronic pain including buprenorphine patch, oxycodone 10-325mg  five times daily. She is on duloxetine and fluoxetine for mood and pain management.  She does not sleep well and takes Ambien every night. She is taking modafinil during the day for daytime sleepiness.   HISTORY: (copied from Dr Rhea Belton note on 11/09/2018)  She has migraines for many many years currently well controlled on 200 mg of Topamax at night. She also takes gabapentin 900mg  daily, which has helped her migraines as well. She uses Imitrex acutely both the injectable and the oral preparation. She thinks her headaches are in good control. She used imitrex po first, if that does not take care of her headaches, she would use injections.  This an early visit for her complains of 2 months history of right side neck pain, right occipital area pain, it bothers her 2/week, lasting all day, she felt spacy dealing with the  pain, she does not like to drive,  She also had trouble walking, do not have the strength to get up an even steps, xone year, she has no incontinence, more so on the left leg, no involvement of left arm, she also has left knee swelling, felt so bad. She had 30 Lb weight gain over one year "can not get into any of my cloth" She also complains of low back pain, radiating pain to her left leg, has tried physical therapy recently,  which has been helpful, no incontinence.   UPDATE June 24th 2015:YY EMG/NCS in June 24th 2015 showed no evidence of left lumbar radiculopathy, there was evidence of right triceps irritation, but no neuropathic changes at other selected right upper extremity needle examination.  We have reviewed MRI lumbar film together, only mild degenerative disc disease, there was no significant foraminal, or canal stenosis   She is taking Cymbalta now, which has helped her some, but continued complaint bilateral lower extremity deep achy pain, especially at nighttime, she no longer has shooting pain from her neck to her shoulders, or arms, but she complains of deep achy pain in her midline upper cervical region,  She also complains fatigued easily, lack of stamina She had left lumbar epidural injection, by interventional radiologist Dr. Lawrence Santiago at left L4-5 space, in August 24th 2015, she only had transient improvement,  Now she complains of left lower extremity pain, bilateral lower extremity deep achy pain, feet paresthesia, gait difficulty,  UPDATE April 25th 2016:YY She complains of two months history of neck pain, spreading forward to bilateral retrorbital area, daily, can go up to 10/10, radiating to her right shoulder, woke up at night with headaches,has been using frequent Imitrex tablet, sometimes injection  She has history of migraine all her life, previous headaches is lateralized severe pounding headache, now it is at her neck, She tries to sleep different ways,Change pillows without helping  She is under pain management for her low back pain,is taking daily hydrocodone, tried Elavil before, complains of dizziness, weight gain.  Update July third 2017:YY She has severe constipation, has not used bathroom for 3 months, She complains of bloating, tried different methods, medications, abdomen massage,  Her migraine has much improved, she has migraines headaches every few times, she  is taking imitrex as needed, which has been helpful she is worried about side effect of Trokendi xr, I will decrease the dosage from 200mg  to 100mg  qhs.  UPDATE May 12 2017: Today she coming with different complaints, complains of frequent awakening at nighttime, drenching sweats, pain in her legs, difficulty falling to sleep, chronic constipation,  Laboratory evaluations in October 2017 showed mild anemia hemoglobin of 12.5, normal CMP,  MRI of cervical and lumbar spine 2015 showed degenerative changes, but there was no significant canal or foraminal narrowing.  Update November 09, 2018: She complains of depression, poor appetite, diffuse body achy pain, frequent almost daily headaches, despite polypharmacy treatment, including Prozac 40 mg daily, Topamax 100 mg 2 tablets at bedtime, Imitrex as needed is helpful most of the time   REVIEW OF SYSTEMS: Out of a complete 14 system review of symptoms, the patient complains only of the following symptoms, sweating, biting lip, dizziness, headaches, vision changes and all other reviewed systems are negative.  ALLERGIES: Allergies  Allergen Reactions  . Iodinated Diagnostic Agents Other (See Comments)    Tthroat itching Tthroat itching, pt received a 13 hour prep for injection  . Codeine   . Depakote [Divalproex Sodium]  Nausea And Vomiting  . Gabapentin Other (See Comments)    Made her "feel crazy"  . Nortriptyline Other (See Comments)    constipation    HOME MEDICATIONS: Outpatient Medications Prior to Visit  Medication Sig Dispense Refill  . Buprenorphine 15 MCG/HR PTWK as needed.    . butalbital-acetaminophen-caffeine (FIORICET) 50-325-40 MG tablet Take one tablet daily as needed for acute migraine. 12 tablets must last 30 days. No early refills allowed. 12 tablet 5  . cyanocobalamin (,VITAMIN B-12,) 1000 MCG/ML injection ADMINISTER 1 ML (1,000 MCG) INTRAMUSCULARLY MONTHLY  10 ML BOTTLE 10 mL 1  . denosumab (PROLIA) 60 MG/ML SOLN  injection Inject 60 mg into the skin every 6 (six) months. Administer in upper arm, thigh, or abdomen    . diclofenac sodium (VOLTAREN) 1 % GEL Apply 2 g topically as needed.    Marland Kitchen FLUoxetine (PROZAC) 40 MG capsule Take 40 mg by mouth daily.    . folic acid (FOLVITE) 1 MG tablet TOME UNA TABLETA TODOS LOS DIAS 90 tablet 3  . Galcanezumab-gnlm (EMGALITY) 120 MG/ML SOAJ Inject 120 mg into the skin every 30 (thirty) days. 1 pen 11  . LINZESS 290 MCG CAPS capsule TAKE 1 CAPSULE BY MOUTH DAILY BEFORE BREAKFAST. 30 capsule 2  . LUMIGAN 0.01 % SOLN Place 1 drop into both eyes at bedtime.    . Misc Natural Products (LEG VEIN & CIRCULATION PO) Take by mouth.    . modafinil (PROVIGIL) 200 MG tablet Take 200 mg by mouth daily as needed.   1  . Multiple Vitamins-Minerals (ONE-A-DAY VITACRAVES IMMUNITY PO) Take by mouth.    . ondansetron (ZOFRAN) 4 MG tablet TAKE 1 TABLET BY MOUTH EVERY 8 HOURS AS NEEDED 20 tablet 0  . oxyCODONE-acetaminophen (PERCOCET) 10-325 MG tablet Filled by pain clinic  0  . polyethylene glycol (MIRALAX / GLYCOLAX) packet Take 17 g by mouth daily.    . propranolol ER (INDERAL LA) 60 MG 24 hr capsule Take 1 capsule (60 mg total) by mouth daily. 90 capsule 3  . SUMAtriptan (IMITREX) 100 MG tablet May repeat in 2 hours if headache persists or recurs. Max 2 tablets in 24 hours. 9 tablet 6  . thiamine 100 MG tablet TAKE 1 TABLET BY MOUTH EVERY DAY 90 tablet 3  . tretinoin (RETIN-A) 0.025 % cream Apply 1 application topically at bedtime. 45 g 1  . triamcinolone cream (KENALOG) 0.1 % APPLY TO CHIN DAILY AT BEDTIME AS NEEDED 30 g 0  . vitamin B-12 (CYANOCOBALAMIN) 1000 MCG tablet Take 1,000 mcg by mouth daily.    . Vitamin D, Ergocalciferol, (DRISDOL) 1.25 MG (50000 UNIT) CAPS capsule Take 1 capsule (50,000 Units total) by mouth every 7 (seven) days. 15 capsule 3  . zolpidem (AMBIEN) 5 MG tablet Take 5 mg by mouth at bedtime as needed for sleep.    . DULoxetine (CYMBALTA) 60 MG capsule Take 60  mg by mouth daily.    . Fluocinolone Acetonide 0.01 % SHAM Apply 1 application topically daily as needed. 120 mL 2   No facility-administered medications prior to visit.    PAST MEDICAL HISTORY: Past Medical History:  Diagnosis Date  . Bowel obstruction ([place])   . Breast cancer ([place])   . Headache(784.0)   . Migraine   . Vision abnormalities    glaucoma    PAST SURGICAL HISTORY: Past Surgical History:  Procedure Laterality Date  . BREAST LUMPECTOMY    . TOTAL ABDOMINAL HYSTERECTOMY N/A     FAMILY  HISTORY: Family History  Problem Relation Age of Onset  . Heart disease Mother   . Headache Mother   . Heart attack Father     SOCIAL HISTORY: Social History   Socioeconomic History  . Marital status: Married    Spouse name: Gwyndolyn Saxon   . Number of children: 0  . Years of education: Assoc  . Highest education level: Not on file  Occupational History  . Occupation: retired  Tobacco Use  . Smoking status: Never Smoker  . Smokeless tobacco: Never Used  Vaping Use  . Vaping Use: Never used  Substance and Sexual Activity  . Alcohol use: Yes    Alcohol/week: 2.0 standard drinks    Types: 2 Standard drinks or equivalent per week    Comment: drinks on the weekend  . Drug use: No  . Sexual activity: Not on file  Other Topics Concern  . Not on file  Social History Narrative   Patient lives at home with her husband. Gwyndolyn Saxon    Patient has an Geophysicist/field seismologist.    Patient has no children.    Patient is right handed.    Social Determinants of Health   Financial Resource Strain:   . Difficulty of Paying Living Expenses: Not on file  Food Insecurity:   . Worried About Charity fundraiser in the Last Year: Not on file  . Ran Out of Food in the Last Year: Not on file  Transportation Needs:   . Lack of Transportation (Medical): Not on file  . Lack of Transportation (Non-Medical): Not on file  Physical Activity:   . Days of Exercise per Week: Not on file  . Minutes of  Exercise per Session: Not on file  Stress:   . Feeling of Stress : Not on file  Social Connections:   . Frequency of Communication with Friends and Family: Not on file  . Frequency of Social Gatherings with Friends and Family: Not on file  . Attends Religious Services: Not on file  . Active Member of Clubs or Organizations: Not on file  . Attends Archivist Meetings: Not on file  . Marital Status: Not on file  Intimate Partner Violence:   . Fear of Current or Ex-Partner: Not on file  . Emotionally Abused: Not on file  . Physically Abused: Not on file  . Sexually Abused: Not on file      PHYSICAL EXAM  Vitals:   11/16/19 1252  BP: (!) 149/80  Pulse: 69  Weight: 132 lb (59.9 kg)  Height: 4\' 11"  (1.499 m)   Body mass index is 26.66 kg/m.  Generalized: Well developed, in no acute distress  Cardiology: normal rate and rhythm, no murmur noted Respiratory: clear to auscultation bilaterally  Neurological examination  Mentation: Alert oriented to time, place, history taking. Follows all commands speech and language fluent Cranial nerve II-XII: Pupils were equal round reactive to light. Extraocular movements were full, visual field were full on confrontational test. Facial sensation and strength were normal. Head turning and shoulder shrug  were normal and symmetric. Motor: The motor testing reveals 5 over 5 strength of all 4 extremities. Good symmetric motor tone is noted throughout.  Sensory: Sensory testing is intact to soft touch on all 4 extremities. No evidence of extinction is noted.  Coordination: Cerebellar testing reveals good finger-nose-finger and heel-to-shin bilaterally.  Gait and station: Gait is normal.    DIAGNOSTIC DATA (LABS, IMAGING, TESTING) - I reviewed patient records, labs, notes, testing and imaging  myself where available.  No flowsheet data found.   Lab Results  Component Value Date   WBC 7.0 09/16/2018   HGB 12.4 09/16/2018   HCT 37.8  09/16/2018   MCV 102.5 (H) 09/16/2018   PLT 307.0 09/16/2018      Component Value Date/Time   NA 135 06/22/2019 1423   K 4.8 06/22/2019 1423   CL 100 06/22/2019 1423   CO2 28 06/22/2019 1423   GLUCOSE 101 (H) 06/22/2019 1423   BUN 13 06/22/2019 1423   CREATININE 0.75 06/22/2019 1423   CALCIUM 9.9 06/22/2019 1423   PROT 7.3 09/16/2018 1458   ALBUMIN 4.4 09/16/2018 1458   AST 13 09/16/2018 1458   ALT 11 09/16/2018 1458   ALKPHOS 76 09/16/2018 1458   BILITOT 0.3 09/16/2018 1458   GFRNONAA >60 11/21/2015 1104   GFRAA >60 11/21/2015 1104   No results found for: CHOL, HDL, LDLCALC, LDLDIRECT, TRIG, CHOLHDL Lab Results  Component Value Date   HGBA1C 5.2 06/22/2019   Lab Results  Component Value Date   VITAMINB12 413 09/16/2018   Lab Results  Component Value Date   TSH 3.49 09/16/2018       ASSESSMENT AND PLAN 74 y.o. year old female  has a past medical history of Bowel obstruction ([place]), Breast cancer ([place]), Headache(784.0), Migraine, and Vision abnormalities. here with     ICD-10-CM   1. Migraine without status migrainosus, not intractable, unspecified migraine type  G43.909   2. Polypharmacy  Z79.899     Kailie is doing fairly well, today. Headaches have improved since adding propranolol 60mg  daily about 2-3 weeks ago. She was encouraged to continue propranolol 60mg  daily and Emgality injection every 30 days. We will try eletriptan for abortive therapy as sumatriptan has not worked as well recently. She was advised to follow up closely with PCP for concerns as listed in HPI. Neuro exam intact. Could be related to side effects with medications. Healthy lifestyle habits encouraged. She wil follow up in 6 months, sooner if needed. She verbalizes understanding and agreement with this plan.    No orders of the defined types were placed in this encounter.    No orders of the defined types were placed in this encounter.     I spent 25 minutes with the patient. 50% of this  time was spent counseling and educating patient on plan of care and medications.    Debbora Presto, FNP-C 11/16/2019, 12:58 PM [facility] [address], [place], [place] [address] [phone]

## 2019-11-16 NOTE — Patient Instructions (Addendum)
We will continue Emgality injections every 30 days  and propranolol 60mg  daily. I will call in eletriptan for abortive therapy. This will replace sumatriptan. You can take 1 tablet at onset of migraine. You can repeat 1 tablet in 2 hours if needed but do not take more than 2 tablets in 24 hours or 10 tablets per month.   Stay well hydrated. Well balanced diet and regular exercise encouraged.   Follow up in 6 months    Eletriptan tablets What is this medicine? ELETRIPTAN (el ih TRIP tan) is used to treat migraines with or without aura. An aura is a strange feeling or visual disturbance that warns you of an attack. It is not used to prevent migraines. This medicine may be used for other purposes; ask your health care provider or pharmacist if you have questions. COMMON BRAND NAME(S): Relpax What should I tell my health care provider before I take this medicine? They need to know if you have any of these conditions:  cigarette smoker  circulation problems in fingers and toes  diabetes  heart disease  high blood pressure  high cholesterol  history of irregular heartbeat  history of stroke  kidney disease  liver disease  stomach or intestine problems  an unusual or allergic reaction to eletriptan, other medicines, foods, dyes, or preservatives  pregnant or trying to get pregnant  breast-feeding How should I use this medicine? Take this medicine by mouth with a glass of water. Follow the directions on the prescription label. Do not take it more often than directed. Talk to your pediatrician regarding the use of this medicine in children. Special care may be needed. Overdosage: If you think you have taken too much of this medicine contact a poison control center or emergency room at once. NOTE: This medicine is only for you. Do not share this medicine with others. What if I miss a dose? This does not apply. This medicine is not for regular use. What may interact with  this medicine? Do not take this medicine with any of the following medications:  ceritinib  certain antibiotics like clarithromycin or telithromycin  certain antivirals for HIV or hepatitis  certain medicines for fungal infections like ketoconazole, itraconazole, or posaconazole  certain medicines for migraine headache like almotriptan, eletriptan, frovatriptan, naratriptan, rizatriptan, sumatriptan, zolmitriptan  chloramphenicol  conivaptan  ergot alkaloids like dihydroergotamine, ergonovine, ergotamine, methylergonovine  idelalisib  mifepristone  nefazodone  ribociclib This medicine may also interact with the following medications:  certain medicines for depression, anxiety, or psychotic disorders  MAOIs like Carbex, Eldepryl, Marplan, Nardil, and Parnate This list may not describe all possible interactions. Give your health care provider a list of all the medicines, herbs, non-prescription drugs, or dietary supplements you use. Also tell them if you smoke, drink alcohol, or use illegal drugs. Some items may interact with your medicine. What should I watch for while using this medicine? Visit your healthcare professional for regular checks on your progress. Tell your healthcare professional if your symptoms do not start to get better or if they get worse. You may get drowsy or dizzy. Do not drive, use machinery, or do anything that needs mental alertness until you know how this medicine affects you. Do not stand up or sit up quickly, especially if you are an older patient. This reduces the risk of dizzy or fainting spells. Alcohol may interfere with the effect of this medicine. Your mouth may get dry. Chewing sugarless gum or sucking hard candy and  drinking plenty of water may help. Contact your healthcare professional if the problem does not go away or is severe. If you take migraine medicines for 10 or more days a month, your migraines may get worse. Keep a diary of headache  days and medicine use. Contact your healthcare professional if your migraine attacks occur more frequently. What side effects may I notice from receiving this medicine? Side effects that you should report to your doctor or health care professional as soon as possible:  allergic reactions like skin rash, itching or hives, swelling of the face, lips, or tongue  chest pain or chest tightness  signs and symptoms of a dangerous change in heartbeat or heart rhythm like chest pain; dizziness; fast, irregular heartbeat; palpitations; feeling faint or lightheaded; falls; breathing problems  signs and symptoms of a stroke like changes in vision; confusion; trouble speaking or understanding; severe headaches; sudden numbness or weakness of the face, arm or leg; trouble walking; dizziness; loss of balance or coordination  signs and symptoms of serotonin syndrome like irritable; confusion; diarrhea; fast or irregular heartbeat; muscle twitching; stiff muscles; trouble walking; sweating; high fever; seizures; chills; vomiting Side effects that usually do not require medical attention (report to your doctor or health care professional if they continue or are bothersome):  diarrhea  dizziness  drowsiness  dry mouth  headache  nausea, vomiting  pain, tingling, numbness in the hands or feet  stomach pain This list may not describe all possible side effects. Call your doctor for medical advice about side effects. You may report side effects to FDA at 1-800-FDA-1088. Where should I keep my medicine? Keep out of the reach of children. Store at room temperature between 15 and 30 degrees C (59 and 86 degrees F). Throw away any unused medicine after the expiration date. NOTE: This sheet is a summary. It may not cover all possible information. If you have questions about this medicine, talk to your doctor, pharmacist, or health care provider.  2020 Elsevier/Gold Standard (2017-08-12  14:44:50)   Propranolol Tablets What is this medicine? PROPRANOLOL (proe PRAN oh lole) is a beta blocker. It decreases the amount of work your heart has to do and helps your heart beat regularly. It treats high blood pressure and/or prevent chest pain (also called angina). It is also used after a heart attack to prevent a second one. This medicine may be used for other purposes; ask your health care provider or pharmacist if you have questions. COMMON BRAND NAME(S): Inderal What should I tell my health care provider before I take this medicine? They need to know if you have any of these conditions:  circulation problems or blood vessel disease  diabetes  history of heart attack or heart disease, vasospastic angina  kidney disease  liver disease  lung or breathing disease, like asthma or emphysema  pheochromocytoma  slow heart rate  thyroid disease  an unusual or allergic reaction to propranolol, other beta-blockers, medicines, foods, dyes, or preservatives  pregnant or trying to get pregnant  breast-feeding How should I use this medicine? Take this drug by mouth. Take it as directed on the prescription label at the same time every day. Keep taking it unless your health care provider tells you to stop. Talk to your health care provider about the use of this drug in children. Special care may be needed. Overdosage: If you think you have taken too much of this medicine contact a poison control center or emergency room at once. NOTE: This  medicine is only for you. Do not share this medicine with others. What if I miss a dose? If you miss a dose, take it as soon as you can. If it is almost time for your next dose, take only that dose. Do not take double or extra doses. What may interact with this medicine? Do not take this medicine with any of the following medications:  feverfew  phenothiazines like chlorpromazine, mesoridazine, prochlorperazine, thioridazine This medicine  may also interact with the following medications:  aluminum hydroxide gel  antipyrine  antiviral medicines for HIV or AIDS  barbiturates like phenobarbital  certain medicines for blood pressure, heart disease, irregular heart beat  cimetidine  ciprofloxacin  diazepam  fluconazole  haloperidol  isoniazid  medicines for cholesterol like cholestyramine or colestipol  medicines for mental depression  medicines for migraine headache like almotriptan, eletriptan, frovatriptan, naratriptan, rizatriptan, sumatriptan, zolmitriptan  NSAIDs, medicines for pain and inflammation, like ibuprofen or naproxen  phenytoin  rifampin  teniposide  theophylline  thyroid medicines  tolbutamide  warfarin  zileuton This list may not describe all possible interactions. Give your health care provider a list of all the medicines, herbs, non-prescription drugs, or dietary supplements you use. Also tell them if you smoke, drink alcohol, or use illegal drugs. Some items may interact with your medicine. What should I watch for while using this medicine? Visit your doctor or health care professional for regular check ups. Check your blood pressure and pulse rate regularly. Ask your health care professional what your blood pressure and pulse rate should be, and when you should contact them. You may get drowsy or dizzy. Do not drive, use machinery, or do anything that needs mental alertness until you know how this drug affects you. Do not stand or sit up quickly, especially if you are an older patient. This reduces the risk of dizzy or fainting spells. Alcohol can make you more drowsy and dizzy. Avoid alcoholic drinks. This medicine may increase blood sugar. Ask your healthcare provider if changes in diet or medicines are needed if you have diabetes. Do not treat yourself for coughs, colds, or pain while you are taking this medicine without asking your doctor or health care professional for advice.  Some ingredients may increase your blood pressure. What side effects may I notice from receiving this medicine? Side effects that you should report to your doctor or health care professional as soon as possible:  allergic reactions like skin rash, itching or hives, swelling of the face, lips, or tongue  breathing problems  cold hands or feet  difficulty sleeping, nightmares  dry peeling skin  hallucinations  muscle cramps or weakness   signs and symptoms of high blood sugar such as being more thirsty or hungry or having to urinate more than normal. You may also feel very tired or have blurry vision.  slow heart rate  swelling of the legs and ankles  vomiting Side effects that usually do not require medical attention (report to your doctor or health care professional if they continue or are bothersome):  change in sex drive or performance  diarrhea  dry sore eyes  hair loss  nausea  weak or tired This list may not describe all possible side effects. Call your doctor for medical advice about side effects. You may report side effects to FDA at 1-800-FDA-1088. Where should I keep my medicine? Keep out of the reach of children and pets. Store at room temperature between 20 and 25 degrees C (68 and  77 degrees F). Protect from light. Throw away any unused drug after the expiration date. NOTE: This sheet is a summary. It may not cover all possible information. If you have questions about this medicine, talk to your doctor, pharmacist, or health care provider.  2020 Elsevier/Gold Standard (2018-09-04 19:25:51)   Migraine Headache A migraine headache is a very strong throbbing pain on one side or both sides of your head. This type of headache can also cause other symptoms. It can last from 4 hours to 3 days. Talk with your doctor about what things may bring on (trigger) this condition. What are the causes? The exact cause of this condition is not known. This condition may be  triggered or caused by:  Drinking alcohol.  Smoking.  Taking medicines, such as: ? Medicine used to treat chest pain (nitroglycerin). ? Birth control pills. ? Estrogen. ? Some blood pressure medicines.  Eating or drinking certain products.  Doing physical activity. Other things that may trigger a migraine headache include:  Having a menstrual period.  Pregnancy.  Hunger.  Stress.  Not getting enough sleep or getting too much sleep.  Weather changes.  Tiredness (fatigue). What increases the risk?  Being 31-61 years old.  Being female.  Having a family history of migraine headaches.  Being Caucasian.  Having depression or anxiety.  Being very overweight. What are the signs or symptoms?  A throbbing pain. This pain may: ? Happen in any area of the head, such as on one side or both sides. ? Make it hard to do daily activities. ? Get worse with physical activity. ? Get worse around bright lights or loud noises.  Other symptoms may include: ? Feeling sick to your stomach (nauseous). ? Vomiting. ? Dizziness. ? Being sensitive to bright lights, loud noises, or smells.  Before you get a migraine headache, you may get warning signs (an aura). An aura may include: ? Seeing flashing lights or having blind spots. ? Seeing bright spots, halos, or zigzag lines. ? Having tunnel vision or blurred vision. ? Having numbness or a tingling feeling. ? Having trouble talking. ? Having weak muscles.  Some people have symptoms after a migraine headache (postdromal phase), such as: ? Tiredness. ? Trouble thinking (concentrating). How is this treated?  Taking medicines that: ? Relieve pain. ? Relieve the feeling of being sick to your stomach. ? Prevent migraine headaches.  Treatment may also include: ? Having acupuncture. ? Avoiding foods that bring on migraine headaches. ? Learning ways to control your body functions (biofeedback). ? Therapy to help you know and  deal with negative thoughts (cognitive behavioral therapy). Follow these instructions at home: Medicines  Take over-the-counter and prescription medicines only as told by your doctor.  Ask your doctor if the medicine prescribed to you: ? Requires you to avoid driving or using heavy machinery. ? Can cause trouble pooping (constipation). You may need to take these steps to prevent or treat trouble pooping:  Drink enough fluid to keep your pee (urine) pale yellow.  Take over-the-counter or prescription medicines.  Eat foods that are high in fiber. These include beans, whole grains, and fresh fruits and vegetables.  Limit foods that are high in fat and sugar. These include fried or sweet foods. Lifestyle  Do not drink alcohol.  Do not use any products that contain nicotine or tobacco, such as cigarettes, e-cigarettes, and chewing tobacco. If you need help quitting, ask your doctor.  Get at least 8 hours of sleep every night.  Limit and deal with stress. General instructions      Keep a journal to find out what may bring on your migraine headaches. For example, write down: ? What you eat and drink. ? How much sleep you get. ? Any change in what you eat or drink. ? Any change in your medicines.  If you have a migraine headache: ? Avoid things that make your symptoms worse, such as bright lights. ? It may help to lie down in a dark, quiet room. ? Do not drive or use heavy machinery. ? Ask your doctor what activities are safe for you.  Keep all follow-up visits as told by your doctor. This is important. Contact a doctor if:  You get a migraine headache that is different or worse than others you have had.  You have more than 15 headache days in one month. Get help right away if:  Your migraine headache gets very bad.  Your migraine headache lasts longer than 72 hours.  You have a fever.  You have a stiff neck.  You have trouble seeing.  Your muscles feel weak or  like you cannot control them.  You start to lose your balance a lot.  You start to have trouble walking.  You pass out (faint).  You have a seizure. Summary  A migraine headache is a very strong throbbing pain on one side or both sides of your head. These headaches can also cause other symptoms.  This condition may be treated with medicines and changes to your lifestyle.  Keep a journal to find out what may bring on your migraine headaches.  Contact a doctor if you get a migraine headache that is different or worse than others you have had.  Contact your doctor if you have more than 15 headache days in a month. This information is not intended to replace advice given to you by your health care provider. Make sure you discuss any questions you have with your health care provider. Document Revised: 05/22/2018 Document Reviewed: 03/12/2018 Elsevier Patient Education  [place].

## 2019-11-17 ENCOUNTER — Telehealth: Payer: Self-pay | Admitting: Family Medicine

## 2019-11-17 NOTE — Telephone Encounter (Signed)
Pt is calling to inform that a PA is needed on her Eletriptan

## 2019-11-17 NOTE — Telephone Encounter (Deleted)
Pt is calling re: the two new medications called in needing a PA, pt is asking about other options please call

## 2019-11-18 NOTE — Telephone Encounter (Signed)
A PA sent via [facility] for Eletriptan 20 mg.  Key: [id number] - PA Case ID: [id number] Status pending   Medicare part D rx card info [id number] Group ID- [id number]  PCN-[id number]

## 2019-11-22 ENCOUNTER — Other Ambulatory Visit: Payer: Self-pay | Admitting: Family Medicine

## 2019-11-22 MED ORDER — NARATRIPTAN HCL 2.5 MG PO TABS: 2.5000 mg | ORAL_TABLET | ORAL | 5 refills | Status: DC | PRN

## 2019-11-22 NOTE — Telephone Encounter (Signed)
Spoke to pt, relayed message from NP. She is unsure if sumatriptan caused hives,  States she will pick up naratriptan, and will let us know if she has any issues.

## 2019-11-22 NOTE — Telephone Encounter (Signed)
Pt called, would like to stop taking Sumatriptan. Last night had a reaction, itching and did not help my headache. Informed Pt  naratriptan (AMERGE) 2.5 MG tablet has been prescribed today.

## 2019-11-22 NOTE — Telephone Encounter (Signed)
Let her know that I will call in naratriptan. It is very similar and covered under her plan. She may take 1 tablet at onset of migraine. May repeat 1 tablet in 2 hours if needed. No more than 2 tablet in 24 hours or 10 tablets per month. TY!

## 2019-11-22 NOTE — Telephone Encounter (Addendum)
Denied. This drug is not covered on the formulary. We are denying your request because we do not show that you have tried at least 2 covered drugs that can treat your condition. You have already tried Sumatriptan. Other covered drug(s) is/are: Frovatriptan 2.5 mg tablets, Naratriptan 1 and 2.5 mg tablets, Rizatriptan 5 and 10 mg tablets, Rizatriptan 5 and 10 mg dispersible tablets, Zolmitriptan 2.5 and 5 mg tablets, Zolmitriptan 2.5 and 5 mg dispersible tablets, and Ubrelvy oral tablet 50 and 100 mg (prior authorization required). We may be able to make an exception to cover this drug. Your doctor will need to send [place] medical records showing that you tried this drug. If you cannot take the covered drug, your doctor will need to tell us why. Note: Some covered drug(s) may have quantity limits. Please refer to the formulary for details.  Please advise

## 2019-11-25 ENCOUNTER — Ambulatory Visit (INDEPENDENT_AMBULATORY_CARE_PROVIDER_SITE_OTHER): Payer: Medicare Other | Admitting: Internal Medicine

## 2019-11-25 ENCOUNTER — Encounter: Payer: Self-pay | Admitting: Internal Medicine

## 2019-11-25 ENCOUNTER — Other Ambulatory Visit: Payer: Self-pay

## 2019-11-25 VITALS — BP 140/60 | HR 71 | Temp 98.4°F | Resp 16 | Ht 60.0 in | Wt 125.7 lb

## 2019-11-25 DIAGNOSIS — E538 Deficiency of other specified B group vitamins: Secondary | ICD-10-CM

## 2019-11-25 DIAGNOSIS — G894 Chronic pain syndrome: Secondary | ICD-10-CM

## 2019-11-25 DIAGNOSIS — Z23 Encounter for immunization: Secondary | ICD-10-CM

## 2019-11-25 DIAGNOSIS — M545 Low back pain, unspecified: Secondary | ICD-10-CM

## 2019-11-25 DIAGNOSIS — Z0000 Encounter for general adult medical examination without abnormal findings: Secondary | ICD-10-CM

## 2019-11-25 DIAGNOSIS — G43009 Migraine without aura, not intractable, without status migrainosus: Secondary | ICD-10-CM | POA: Diagnosis not present

## 2019-11-25 DIAGNOSIS — G8929 Other chronic pain: Secondary | ICD-10-CM

## 2019-11-25 MED ORDER — LINACLOTIDE 290 MCG PO CAPS: ORAL_CAPSULE | ORAL | 2 refills | Status: DC

## 2019-11-25 NOTE — Patient Instructions (Signed)
-Nice seeing you today!!  -Lab work today; will notify you once results are available.  -Flu vaccine today.  -Remember to get your shingles vaccines at the pharmacy.  -Schedule follow up in 6 months or sooner as needed.   Preventive Care 75 Years and Older, Female Preventive care refers to lifestyle choices and visits with your health care provider that can promote health and wellness. This includes:  A yearly physical exam. This is also called an annual well check.  Regular dental and eye exams.  Immunizations.  Screening for certain conditions.  Healthy lifestyle choices, such as diet and exercise. What can I expect for my preventive care visit? Physical exam Your health care provider will check:  Height and weight. These may be used to calculate body mass index (BMI), which is a measurement that tells if you are at a healthy weight.  Heart rate and blood pressure.  Your skin for abnormal spots. Counseling Your health care provider may ask you questions about:  Alcohol, tobacco, and drug use.  Emotional well-being.  Home and relationship well-being.  Sexual activity.  Eating habits.  History of falls.  Memory and ability to understand (cognition).  Work and work Statistician.  Pregnancy and menstrual history. What immunizations do I need?  Influenza (flu) vaccine  This is recommended every year. Tetanus, diphtheria, and pertussis (Tdap) vaccine  You may need a Td booster every 10 years. Varicella (chickenpox) vaccine  You may need this vaccine if you have not already been vaccinated. Zoster (shingles) vaccine  You may need this after age 75. Pneumococcal conjugate (PCV13) vaccine  One dose is recommended after age 75. Pneumococcal polysaccharide (PPSV23) vaccine  One dose is recommended after age 75. Measles, mumps, and rubella (MMR) vaccine  You may need at least one dose of MMR if you were born in 1957 or later. You may also need a second  dose. Meningococcal conjugate (MenACWY) vaccine  You may need this if you have certain conditions. Hepatitis A vaccine  You may need this if you have certain conditions or if you travel or work in places where you may be exposed to hepatitis A. Hepatitis B vaccine  You may need this if you have certain conditions or if you travel or work in places where you may be exposed to hepatitis B. Haemophilus influenzae type b (Hib) vaccine  You may need this if you have certain conditions. You may receive vaccines as individual doses or as more than one vaccine together in one shot (combination vaccines). Talk with your health care provider about the risks and benefits of combination vaccines. What tests do I need? Blood tests  Lipid and cholesterol levels. These may be checked every 5 years, or more frequently depending on your overall health.  Hepatitis C test.  Hepatitis B test. Screening  Lung cancer screening. You may have this screening every year starting at age 75 if you have a 30-pack-year history of smoking and currently smoke or have quit within the past 15 years.  Colorectal cancer screening. All adults should have this screening starting at age 75 and continuing until age 66. Your health care provider may recommend screening at age 59 if you are at increased risk. You will have tests every 1-10 years, depending on your results and the type of screening test.  Diabetes screening. This is done by checking your blood sugar (glucose) after you have not eaten for a while (fasting). You may have this done every 1-75 years.  Mammogram.  be done every 1-75 years. Talk with your health care provider about how often you should have regular mammograms.  BRCA-related cancer screening. This may be done if you have a family history of breast, ovarian, tubal, or peritoneal cancers. Other tests  Sexually transmitted disease (STD) testing.  Bone density scan. This is done to screen for  osteoporosis. You may have this done starting at age 75. Follow these instructions at home: Eating and drinking  Eat a diet that includes fresh fruits and vegetables, whole grains, lean protein, and low-fat dairy products. Limit your intake of foods with high amounts of sugar, saturated fats, and salt.  Take vitamin and mineral supplements as recommended by your health care provider.  Do not drink alcohol if your health care provider tells you not to drink.  If you drink alcohol: ? Limit how much you have to 0-1 drink a day. ? Be aware of how much alcohol is in your drink. In the [place] one drink equals one 12 oz bottle of beer (355 mL), one 5 oz glass of wine (148 mL), or one 1 oz glass of hard liquor (44 mL). Lifestyle  Take daily care of your teeth and gums.  Stay active. Exercise for at least 30 minutes on 5 or more days each week.  Do not use any products that contain nicotine or tobacco, such as cigarettes, e-cigarettes, and chewing tobacco. If you need help quitting, ask your health care provider.  If you are sexually active, practice safe sex. Use a condom or other form of protection in order to prevent STIs (sexually transmitted infections).  Talk with your health care provider about taking a low-dose aspirin or statin. What's next?  Go to your health care provider once a year for a well check visit.  Ask your health care provider how often you should have your eyes and teeth checked.  Stay up to date on all vaccines. This information is not intended to replace advice given to you by your health care provider. Make sure you discuss any questions you have with your health care provider. Document Revised: 01/22/2018 Document Reviewed: 01/22/2018 Elsevier Patient Education  2020 [employer].  

## 2019-11-25 NOTE — Progress Notes (Signed)
Established Patient Office Visit     This visit occurred during the SARS-CoV-2 public health emergency.  Safety protocols were in place, including screening questions prior to the visit, additional usage of staff PPE, and extensive cleaning of exam room while observing appropriate contact time as indicated for disinfecting solutions.    CC/Reason for Visit: Subsequent Medicare wellness visit  HPI: Jill Salinas is a 74 y.o. female who is coming in today for the above mentioned reasons. Past Medical History is significant for: Hyperlipidemia not on medications, history of migraine headaches followed by neurology chronic pain syndrome on oxycodone as prescribed by pain clinic.  She also has glaucoma followed by ophthalmology.  She has had her Covid booster, she needs flu vaccine and shingles vaccine.  She had a mammogram in April, she elects to defer colon and cervical cancer screening.  She has no acute complaints today.  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   Past Medical/Surgical History: Past Medical History:  Diagnosis Date  . Bowel obstruction ([place])   . Breast cancer ([place])   . Headache(784.0)   . Migraine   . Vision abnormalities    glaucoma    Past Surgical History:  Procedure Laterality Date  . BREAST LUMPECTOMY    . TOTAL ABDOMINAL HYSTERECTOMY N/A     Social History:  reports that she has never smoked. She has never used smokeless tobacco. She reports current alcohol use of about 2.0 standard drinks of alcohol per week. She reports that she does not use drugs.  Allergies: Allergies  Allergen Reactions  . Iodinated Diagnostic Agents Other (See Comments)    Tthroat itching Tthroat itching, pt received a 13 hour prep for injection  . Codeine   . Depakote [Divalproex Sodium] Nausea And Vomiting  . Gabapentin Other (See Comments)    Made her "feel crazy"  . Nortriptyline Other (See Comments)     constipation    Family History:  Family History  Problem Relation Age of Onset  . Heart disease Mother   . Headache Mother   . Heart attack Father      Current Outpatient Medications:  .  Buprenorphine 15 MCG/HR PTWK, as needed., Disp: , Rfl:  .  cyanocobalamin (,VITAMIN B-12,) 1000 MCG/ML injection, ADMINISTER 1 ML (1,000 MCG) INTRAMUSCULARLY MONTHLY  10 ML BOTTLE, Disp: 10 mL, Rfl: 1 .  denosumab (PROLIA) 60 MG/ML SOLN injection, Inject 60 mg into the skin every 6 (six) months. Administer in upper arm, thigh, or abdomen, Disp: , Rfl:  .  diclofenac sodium (VOLTAREN) 1 % GEL, Apply 2 g topically as needed., Disp: , Rfl:  .  FLUoxetine (PROZAC) 40 MG capsule, Take 40 mg by mouth daily., Disp: , Rfl:  .  folic acid (FOLVITE) 1 MG tablet, TOME UNA TABLETA TODOS LOS DIAS, Disp: 90 tablet, Rfl: 3 .  Galcanezumab-gnlm (EMGALITY) 120 MG/ML SOAJ, Inject 120 mg into the skin every 30 (thirty) days., Disp: 1 pen, Rfl: 11 .  LINZESS 290 MCG CAPS capsule, TAKE 1 CAPSULE BY MOUTH DAILY BEFORE BREAKFAST., Disp: 30 capsule, Rfl: 2 .  LUMIGAN 0.01 % SOLN, Place 1 drop into both eyes at bedtime., Disp: , Rfl:  .  Misc Natural Products (LEG VEIN & CIRCULATION PO), Take by mouth., Disp: , Rfl:  .  modafinil (PROVIGIL) 200 MG tablet, Take 200 mg by mouth daily as needed. , Disp: , Rfl: 1 .  Multiple Vitamins-Minerals (  ONE-A-DAY VITACRAVES IMMUNITY PO), Take by mouth., Disp: , Rfl:  .  naratriptan (AMERGE) 2.5 MG tablet, Take 1 tablet (2.5 mg total) by mouth as needed for migraine. Take one (1) tablet at onset of headache; if returns or does not resolve, may repeat after 4 hours; do not exceed five (5) mg in 24 hours., Disp: 10 tablet, Rfl: 5 .  ondansetron (ZOFRAN) 4 MG tablet, TAKE 1 TABLET BY MOUTH EVERY 8 HOURS AS NEEDED, Disp: 20 tablet, Rfl: 0 .  oxyCODONE-acetaminophen (PERCOCET) 10-325 MG tablet, Filled by pain clinic, Disp: , Rfl: 0 .  polyethylene glycol (MIRALAX / GLYCOLAX) packet, Take 17 g by  mouth daily., Disp: , Rfl:  .  propranolol ER (INDERAL LA) 60 MG 24 hr capsule, Take 1 capsule (60 mg total) by mouth daily., Disp: 90 capsule, Rfl: 3 .  thiamine 100 MG tablet, TAKE 1 TABLET BY MOUTH EVERY DAY, Disp: 90 tablet, Rfl: 3 .  tretinoin (RETIN-A) 0.025 % cream, Apply 1 application topically at bedtime., Disp: 45 g, Rfl: 1 .  triamcinolone cream (KENALOG) 0.1 %, APPLY TO CHIN DAILY AT BEDTIME AS NEEDED, Disp: 30 g, Rfl: 0 .  vitamin B-12 (CYANOCOBALAMIN) 1000 MCG tablet, Take 1,000 mcg by mouth daily., Disp: , Rfl:  .  Vitamin D, Ergocalciferol, (DRISDOL) 1.25 MG (50000 UNIT) CAPS capsule, Take 1 capsule (50,000 Units total) by mouth every 7 (seven) days., Disp: 15 capsule, Rfl: 3 .  zolpidem (AMBIEN) 5 MG tablet, Take 5 mg by mouth at bedtime as needed for sleep., Disp: , Rfl:   Review of Systems:  Constitutional: Denies fever, chills, diaphoresis, appetite change and fatigue.  HEENT: Denies photophobia, eye pain, redness, hearing loss, ear pain, congestion, sore throat, rhinorrhea, sneezing, mouth sores, trouble swallowing, neck pain, neck stiffness and tinnitus.   Respiratory: Denies SOB, DOE, cough, chest tightness,  and wheezing.   Cardiovascular: Denies chest pain, palpitations and leg swelling.  Gastrointestinal: Denies nausea, vomiting, abdominal pain, diarrhea, constipation, blood in stool and abdominal distention.  Genitourinary: Denies dysuria, urgency, frequency, hematuria, flank pain and difficulty urinating.  Endocrine: Denies: hot or cold intolerance, sweats, changes in hair or nails, polyuria, polydipsia. Musculoskeletal: Denies myalgias, back pain, joint swelling, arthralgias and gait problem.  Skin: Denies pallor, rash and wound.  Neurological: Denies dizziness, seizures, syncope, weakness, light-headedness, numbness and headaches.  Hematological: Denies adenopathy. Easy bruising, personal or family bleeding history  Psychiatric/Behavioral: Denies suicidal ideation,  mood changes, confusion, nervousness, sleep disturbance and agitation    Physical Exam: Vitals:   11/25/19 1418  BP: 140/60  Pulse: 71  Resp: 16  Temp: 98.4 F (36.9 C)  TempSrc: Oral  SpO2: 98%  Weight: 125 lb 11.2 oz (57 kg)  Height: 5' (1.524 m)    Body mass index is 24.55 kg/m.   Constitutional: NAD, calm, comfortable Eyes: PERRL, lids and conjunctivae normal ENMT: Mucous membranes are moist. Posterior pharynx clear of any exudate or lesions. Normal dentition. Tympanic membrane is pearly white, no erythema or bulging. Neck: normal, supple, no masses, no thyromegaly Respiratory: clear to auscultation bilaterally, no wheezing, no crackles. Normal respiratory effort. No accessory muscle use.  Cardiovascular: Regular rate and rhythm, no murmurs / rubs / gallops. No extremity edema. 2+ pedal pulses. No carotid bruits.  Abdomen: no tenderness, no masses palpated. No hepatosplenomegaly. Bowel sounds positive.  Musculoskeletal: no clubbing / cyanosis. No joint deformity upper and lower extremities. Good ROM, no contractures. Normal muscle tone.  Skin: no rashes, lesions, ulcers. No induration  Neurologic: CN 2-12 grossly intact. Sensation intact, DTR normal. Strength 5/5 in all 4.  Psychiatric: Normal judgment and insight. Alert and oriented x 3. Normal mood.   Subsequent Medicare wellness visit   1. Risk factors, based on past  M,S,F -cardiovascular disease risk factors include age only   2.  Physical activities: Very sedentary   3.  Depression/mood:  She has a history of depression, mood appears stable today   4.  Hearing:  No perceived issues   5.  ADL's: Independent in all ADLs   6.  Fall risk:  Mild to moderate fall risk   7.  Home safety: No problems identified   8.  Height weight, and visual acuity: height and weight as above, vision:   Visual Acuity Screening   Right eye Left eye Both eyes  Without correction: 20/30 20/25 20/25  With correction:        9.   Counseling:  Advise shingles vaccine at pharmacy   10. Lab orders based on risk factors: Laboratory update will be reviewed   11. Referral :  None today   12. Care plan:  Up with me in 6 months   13. Cognitive assessment:  No cognitive impairment   14. Screening: Patient provided with a written and personalized 5-10 year screening schedule in the AVS.   yes   15. Provider List Update:   PCP, ophthalmology, neurology  16. Advance Directives: Full code     Video Visit from 06/08/2019 in [place] at [place]  PHQ-9 Total Score 12      Fall Risk  06/08/2019 09/16/2018 07/02/2016  Falls in the past year? 0 0 No  Number falls in past yr: 0 0 -  Injury with Fall? 0 0 -     Impression and Plan:  Medicare annual wellness visit, subsequent  -Advised routine eye and dental care. -Healthy lifestyle discussed in detail. -Lab work updated. -Flu vaccine today, she will get her shingles at her pharmacy, otherwise immunizations are up-to-date. -She had a mammogram in April 2021, she has elected to defer cervical and colon cancer screening.  B12 deficiency  - Plan: Vitamin B12  Chronic left-sided low back pain, unspecified whether sciatica present Chronic pain syndrome  -On chronic opioids through pain clinic in [place]. 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  Migraine without aura and without status migrainosus, not intractable -Followed by neurology.  Need for influenza vaccination -Flu vaccine administered today.    Patient Instructions  -Nice seeing you today!!  -Lab work today; will notify you once results are available.  -Flu vaccine today.  -Remember to get your shingles vaccines at the pharmacy.  -Schedule follow up in 6 months or sooner as needed.   Preventive Care 78 Years and Older, Female Preventive care refers to lifestyle choices and visits with your health care provider that can promote health  and wellness. This includes:  A yearly physical exam. This is also called an annual well check.  Regular dental and eye exams.  Immunizations.  Screening for certain conditions.  Healthy lifestyle choices, such as diet and exercise. What can I expect for my preventive care visit? Physical exam Your health care provider will check:  Height and weight. These may be used to calculate body mass index (BMI), which is a measurement that tells if you are at a healthy weight.  Heart rate and blood pressure.  Your skin for abnormal spots. Counseling Your health care provider  may ask you questions about:  Alcohol, tobacco, and drug use.  Emotional well-being.  Home and relationship well-being.  Sexual activity.  Eating habits.  History of falls.  Memory and ability to understand (cognition).  Work and work Statistician.  Pregnancy and menstrual history. What immunizations do I need?  Influenza (flu) vaccine  This is recommended every year. Tetanus, diphtheria, and pertussis (Tdap) vaccine  You may need a Td booster every 10 years. Varicella (chickenpox) vaccine  You may need this vaccine if you have not already been vaccinated. Zoster (shingles) vaccine  You may need this after age 77. Pneumococcal conjugate (PCV13) vaccine  One dose is recommended after age 32. Pneumococcal polysaccharide (PPSV23) vaccine  One dose is recommended after age 88. Measles, mumps, and rubella (MMR) vaccine  You may need at least one dose of MMR if you were born in 1957 or later. You may also need a second dose. Meningococcal conjugate (MenACWY) vaccine  You may need this if you have certain conditions. Hepatitis A vaccine  You may need this if you have certain conditions or if you travel or work in places where you may be exposed to hepatitis A. Hepatitis B vaccine  You may need this if you have certain conditions or if you travel or work in places where you may be exposed to  hepatitis B. Haemophilus influenzae type b (Hib) vaccine  You may need this if you have certain conditions. You may receive vaccines as individual doses or as more than one vaccine together in one shot (combination vaccines). Talk with your health care provider about the risks and benefits of combination vaccines. What tests do I need? Blood tests  Lipid and cholesterol levels. These may be checked every 5 years, or more frequently depending on your overall health.  Hepatitis C test.  Hepatitis B test. Screening  Lung cancer screening. You may have this screening every year starting at age 76 if you have a 30-pack-year history of smoking and currently smoke or have quit within the past 15 years.  Colorectal cancer screening. All adults should have this screening starting at age 4 and continuing until age 65. Your health care provider may recommend screening at age 68 if you are at increased risk. You will have tests every 1-10 years, depending on your results and the type of screening test.  Diabetes screening. This is done by checking your blood sugar (glucose) after you have not eaten for a while (fasting). You may have this done every 1-3 years.  Mammogram. This may be done every 1-2 years. Talk with your health care provider about how often you should have regular mammograms.  BRCA-related cancer screening. This may be done if you have a family history of breast, ovarian, tubal, or peritoneal cancers. Other tests  Sexually transmitted disease (STD) testing.  Bone density scan. This is done to screen for osteoporosis. You may have this done starting at age 36. Follow these instructions at home: Eating and drinking  Eat a diet that includes fresh fruits and vegetables, whole grains, lean protein, and low-fat dairy products. Limit your intake of foods with high amounts of sugar, saturated fats, and salt.  Take vitamin and mineral supplements as recommended by your health care  provider.  Do not drink alcohol if your health care provider tells you not to drink.  If you drink alcohol: ? Limit how much you have to 0-1 drink a day. ? Be aware of how much alcohol is in your drink.  In the [place] one drink equals one 12 oz bottle of beer (355 mL), one 5 oz glass of wine (148 mL), or one 1 oz glass of hard liquor (44 mL). Lifestyle  Take daily care of your teeth and gums.  Stay active. Exercise for at least 30 minutes on 5 or more days each week.  Do not use any products that contain nicotine or tobacco, such as cigarettes, e-cigarettes, and chewing tobacco. If you need help quitting, ask your health care provider.  If you are sexually active, practice safe sex. Use a condom or other form of protection in order to prevent STIs (sexually transmitted infections).  Talk with your health care provider about taking a low-dose aspirin or statin. What's next?  Go to your health care provider once a year for a well check visit.  Ask your health care provider how often you should have your eyes and teeth checked.  Stay up to date on all vaccines. This information is not intended to replace advice given to you by your health care provider. Make sure you discuss any questions you have with your health care provider. Document Revised: 01/22/2018 Document Reviewed: 01/22/2018 Elsevier Patient Education  2020 [place], [place] [facility]

## 2019-11-26 LAB — COMPREHENSIVE METABOLIC PANEL
AG Ratio: 1.7 (calc) (ref 1.0–2.5)
ALT: 10 U/L (ref 6–29)
AST: 14 U/L (ref 10–35)
Albumin: 4.5 g/dL (ref 3.6–5.1)
Alkaline phosphatase (APISO): 60 U/L (ref 37–153)
BUN: 12 mg/dL (ref 7–25)
CO2: 29 mmol/L (ref 20–32)
Calcium: 9.8 mg/dL (ref 8.6–10.4)
Chloride: 106 mmol/L (ref 98–110)
Creat: 0.93 mg/dL (ref 0.60–0.93)
Globulin: 2.6 g/dL (calc) (ref 1.9–3.7)
Glucose, Bld: 72 mg/dL (ref 65–99)
Potassium: 5.6 mmol/L — ABNORMAL HIGH (ref 3.5–5.3)
Sodium: 143 mmol/L (ref 135–146)
Total Bilirubin: 0.3 mg/dL (ref 0.2–1.2)
Total Protein: 7.1 g/dL (ref 6.1–8.1)

## 2019-11-26 LAB — CBC WITH DIFFERENTIAL/PLATELET
Absolute Monocytes: 616 cells/uL (ref 200–950)
Basophils Absolute: 53 cells/uL (ref 0–200)
Basophils Relative: 0.7 %
Eosinophils Absolute: 175 cells/uL (ref 15–500)
Eosinophils Relative: 2.3 %
HCT: 37.5 % (ref 35.0–45.0)
Hemoglobin: 12.2 g/dL (ref 11.7–15.5)
Lymphs Abs: 1930 cells/uL (ref 850–3900)
MCH: 33.1 pg — ABNORMAL HIGH (ref 27.0–33.0)
MCHC: 32.5 g/dL (ref 32.0–36.0)
MCV: 101.6 fL — ABNORMAL HIGH (ref 80.0–100.0)
MPV: 9.7 fL (ref 7.5–12.5)
Monocytes Relative: 8.1 %
Neutro Abs: 4826 cells/uL (ref 1500–7800)
Neutrophils Relative %: 63.5 %
Platelets: 270 10*3/uL (ref 140–400)
RBC: 3.69 10*6/uL — ABNORMAL LOW (ref 3.80–5.10)
RDW: 11.7 % (ref 11.0–15.0)
Total Lymphocyte: 25.4 %
WBC: 7.6 10*3/uL (ref 3.8–10.8)

## 2019-11-26 LAB — LIPID PANEL
Cholesterol: 225 mg/dL — ABNORMAL HIGH (ref <200)
HDL: 55 mg/dL (ref >=50)
LDL Cholesterol (Calc): 133 mg/dL (calc) — ABNORMAL HIGH
Non-HDL Cholesterol (Calc): 170 mg/dL (calc) — ABNORMAL HIGH (ref <130)
Total CHOL/HDL Ratio: 4.1 (calc) (ref <5.0)
Triglycerides: 231 mg/dL — ABNORMAL HIGH (ref <150)

## 2019-11-26 LAB — VITAMIN B12: Vitamin B-12: 610 pg/mL (ref 200–1100)

## 2019-12-01 ENCOUNTER — Telehealth: Payer: Self-pay | Admitting: Internal Medicine

## 2019-12-01 ENCOUNTER — Other Ambulatory Visit: Payer: Self-pay | Admitting: Internal Medicine

## 2019-12-01 DIAGNOSIS — E785 Hyperlipidemia, unspecified: Secondary | ICD-10-CM

## 2019-12-01 DIAGNOSIS — E1169 Type 2 diabetes mellitus with other specified complication: Secondary | ICD-10-CM

## 2019-12-01 NOTE — Telephone Encounter (Signed)
Patient was calling because she had a question about daily symptoms that she has that in her chart said denies on it.  She is wanting a call back.  She said you can call her home number if she doesn't answer her cell number.

## 2019-12-02 NOTE — Telephone Encounter (Signed)
Spoke with patient and an appointment is scheduled

## 2019-12-07 ENCOUNTER — Other Ambulatory Visit: Payer: Self-pay

## 2019-12-07 ENCOUNTER — Telehealth (INDEPENDENT_AMBULATORY_CARE_PROVIDER_SITE_OTHER): Payer: Medicare Other | Admitting: Internal Medicine

## 2019-12-07 DIAGNOSIS — F339 Major depressive disorder, recurrent, unspecified: Secondary | ICD-10-CM | POA: Diagnosis not present

## 2019-12-07 DIAGNOSIS — G894 Chronic pain syndrome: Secondary | ICD-10-CM

## 2019-12-07 MED ORDER — LINACLOTIDE 290 MCG PO CAPS: ORAL_CAPSULE | ORAL | 2 refills | Status: DC

## 2019-12-07 MED ORDER — FLUOXETINE HCL 40 MG PO CAPS: 40.0000 mg | ORAL_CAPSULE | Freq: Every day | ORAL | 1 refills | Status: DC

## 2019-12-07 NOTE — Progress Notes (Signed)
Virtual Visit via Telephone Note  I connected with Jill Salinas on 12/07/19 at  2:30 PM EDT by telephone and verified that I am speaking with the correct person using two identifiers.   I discussed the limitations, risks, security and privacy concerns of performing an evaluation and management service by telephone and the availability of in person appointments. I also discussed with the patient that there may be a patient responsible charge related to this service. The patient expressed understanding and agreed to proceed.  Location patient: home Location provider: work office Participants present for the call: patient, provider Patient did not have a visit in the prior 7 days to address this/these issue(s).   History of Present Illness:  She has scheduled this visit for medication refills of her Prozac that she takes for depression and Linzess that she takes for her opioid related chronic constipation. She states her mood is stable. She has ups and downs as is related to her chronic pain syndrome. Please see previous notes for details of work-up for chronic back issues.   Observations/Objective: Patient sounds cheerful and well on the phone. I do not appreciate any increased work of breathing. Speech and thought processing are grossly intact. Patient reported vitals: None reported   Current Outpatient Medications:  .  Buprenorphine 15 MCG/HR PTWK, as needed., Disp: , Rfl:  .  cyanocobalamin (,VITAMIN B-12,) 1000 MCG/ML injection, ADMINISTER 1 ML (1,000 MCG) INTRAMUSCULARLY MONTHLY  10 ML BOTTLE, Disp: 10 mL, Rfl: 1 .  denosumab (PROLIA) 60 MG/ML SOLN injection, Inject 60 mg into the skin every 6 (six) months. Administer in upper arm, thigh, or abdomen, Disp: , Rfl:  .  diclofenac sodium (VOLTAREN) 1 % GEL, Apply 2 g topically as needed., Disp: , Rfl:  .  FLUoxetine (PROZAC) 40 MG capsule, Take 1 capsule (40 mg total) by mouth daily., Disp: 90 capsule, Rfl: 1 .  folic acid  (FOLVITE) 1 MG tablet, TOME UNA TABLETA TODOS LOS DIAS, Disp: 90 tablet, Rfl: 3 .  Galcanezumab-gnlm (EMGALITY) 120 MG/ML SOAJ, Inject 120 mg into the skin every 30 (thirty) days., Disp: 1 pen, Rfl: 11 .  linaclotide (LINZESS) 290 MCG CAPS capsule, TAKE 1 CAPSULE BY MOUTH DAILY BEFORE BREAKFAST., Disp: 30 capsule, Rfl: 2 .  LUMIGAN 0.01 % SOLN, Place 1 drop into both eyes at bedtime., Disp: , Rfl:  .  Misc Natural Products (LEG VEIN & CIRCULATION PO), Take by mouth., Disp: , Rfl:  .  modafinil (PROVIGIL) 200 MG tablet, Take 200 mg by mouth daily as needed. , Disp: , Rfl: 1 .  Multiple Vitamins-Minerals (ONE-A-DAY VITACRAVES IMMUNITY PO), Take by mouth., Disp: , Rfl:  .  naratriptan (AMERGE) 2.5 MG tablet, Take 1 tablet (2.5 mg total) by mouth as needed for migraine. Take one (1) tablet at onset of headache; if returns or does not resolve, may repeat after 4 hours; do not exceed five (5) mg in 24 hours., Disp: 10 tablet, Rfl: 5 .  ondansetron (ZOFRAN) 4 MG tablet, TAKE 1 TABLET BY MOUTH EVERY 8 HOURS AS NEEDED, Disp: 20 tablet, Rfl: 0 .  oxyCODONE-acetaminophen (PERCOCET) 10-325 MG tablet, Filled by pain clinic, Disp: , Rfl: 0 .  polyethylene glycol (MIRALAX / GLYCOLAX) packet, Take 17 g by mouth daily., Disp: , Rfl:  .  propranolol ER (INDERAL LA) 60 MG 24 hr capsule, Take 1 capsule (60 mg total) by mouth daily., Disp: 90 capsule, Rfl: 3 .  thiamine 100 MG tablet, TAKE 1 TABLET  BY MOUTH EVERY DAY, Disp: 90 tablet, Rfl: 3 .  tretinoin (RETIN-A) 0.025 % cream, Apply 1 application topically at bedtime., Disp: 45 g, Rfl: 1 .  triamcinolone cream (KENALOG) 0.1 %, APPLY TO CHIN DAILY AT BEDTIME AS NEEDED, Disp: 30 g, Rfl: 0 .  vitamin B-12 (CYANOCOBALAMIN) 1000 MCG tablet, Take 1,000 mcg by mouth daily., Disp: , Rfl:  .  Vitamin D, Ergocalciferol, (DRISDOL) 1.25 MG (50000 UNIT) CAPS capsule, Take 1 capsule (50,000 Units total) by mouth every 7 (seven) days., Disp: 15 capsule, Rfl: 3 .  zolpidem (AMBIEN) 5  MG tablet, Take 5 mg by mouth at bedtime as needed for sleep., Disp: , Rfl:   Review of Systems:  Constitutional: Denies fever, chills, diaphoresis, appetite change. HEENT: Denies photophobia, eye pain, redness, hearing loss, ear pain, congestion, sore throat, rhinorrhea, sneezing, mouth sores, trouble swallowing, neck pain, neck stiffness and tinnitus.   Respiratory: Denies SOB, DOE, cough, chest tightness,  and wheezing.   Cardiovascular: Denies chest pain, palpitations. Gastrointestinal: Denies nausea, vomiting, abdominal pain, diarrhea, constipation, blood in stool and abdominal distention.  Genitourinary: Denies dysuria, urgency, frequency, hematuria, flank pain and difficulty urinating.  Endocrine: Denies: hot or cold intolerance, sweats, changes in hair or nails, polyuria, polydipsia. Musculoskeletal: Denies  joint swelling, arthralgias and gait problem.  Skin: Denies pallor, rash and wound.  Neurological: Denies dizziness, seizures, syncope, weakness, light-headedness, numbness and headaches.  Hematological: Denies adenopathy. Easy bruising, personal or family bleeding history  Psychiatric/Behavioral: Denies suicidal ideation, mood changes, confusion, nervousness, sleep disturbance and agitation   Assessment and Plan:  Depression, recurrent ([place]) -At baseline. -She is requesting prozac refills today.  Chronic pain syndrome -Refill linzess that she uses to manage opioid-related chronic constipation.    I discussed the assessment and treatment plan with the patient. The patient was provided an opportunity to ask questions and all were answered. The patient agreed with the plan and demonstrated an understanding of the instructions.   The patient was advised to call back or seek an in-person evaluation if the symptoms worsen or if the condition fails to improve as anticipated.  I provided 12 minutes of non-face-to-face time during this encounter.   Lelon Frohlich,  MD [facility] at [facility]

## 2019-12-14 ENCOUNTER — Telehealth: Payer: Self-pay | Admitting: Family Medicine

## 2019-12-14 NOTE — Telephone Encounter (Signed)
Pt called wanting to speak to RN regarding a headache she has had for 5 days. Please advise.

## 2019-12-15 NOTE — Telephone Encounter (Signed)
I called pt and relayed the message per AL/NP to take amerge as directed.  See pcp for other concerns.  Also see opthamology.  She verbalized understanding. And will call us back as needed.

## 2019-12-15 NOTE — Telephone Encounter (Signed)
I recommend she take Amerge as directed as it seems to help. She should follow up with PCP for concerns of neck pain, fatigue, hairloss and difficulty sleeping. Ophthalmology for glaucoma follow up. I do not recommend steroids at this time. Healthy lifestyle habits as you recommended are encouraged.

## 2019-12-15 NOTE — Telephone Encounter (Signed)
LMVM for pt that returned her call.  

## 2019-12-15 NOTE — Telephone Encounter (Signed)
I called pt. She has been having migraine headaches for the last 2 wks.  Head, neck, eyes bothering her, (making appt with eye MD for exam -has glaucoma).  No n/v, light and noise sensitivity. Wakes with them and will also wake up during night with them.  Not sleeping / fatigued.  She mentioned hairloss, new thing, has had blood work via pcp.  Fells like she is hydrated, and I relayed that remaining hydrated and eating regular meals helps.  Has oxycodone that she will take prn from other office and she states this helps.  She had not had depacon or prednisone.  I mentioned these 2 things.  Amerge she states has helped some.  Please advise. Last seen 11-16-19.

## 2019-12-16 ENCOUNTER — Telehealth: Payer: Self-pay

## 2019-12-16 NOTE — Telephone Encounter (Signed)
Left the patient a VM making her aware that she is due for Prolia-she is ok to get this on on or after 02/06/20 she owes $0 at check-in

## 2019-12-24 ENCOUNTER — Other Ambulatory Visit: Payer: Self-pay | Admitting: Neurology

## 2019-12-28 NOTE — Telephone Encounter (Signed)
This encounter was created in error - please disregard.

## 2020-01-05 ENCOUNTER — Telehealth: Payer: Self-pay | Admitting: *Deleted

## 2020-01-05 NOTE — Telephone Encounter (Signed)
Patient has not completed the Cologuard test.

## 2020-01-10 NOTE — Telephone Encounter (Signed)
Let's call when possible and ask why.Marland KitchenMarland Kitchen

## 2020-01-11 NOTE — Telephone Encounter (Signed)
Left message on machine for patient to return our call 

## 2020-01-20 NOTE — Telephone Encounter (Signed)
Left message on machine for patient to return our call 

## 2020-01-20 NOTE — Telephone Encounter (Signed)
Pt returned the call to the office. 

## 2020-01-21 NOTE — Telephone Encounter (Signed)
Mychart message sent.

## 2020-02-04 ENCOUNTER — Other Ambulatory Visit: Payer: Self-pay | Admitting: Internal Medicine

## 2020-02-04 DIAGNOSIS — L309 Dermatitis, unspecified: Secondary | ICD-10-CM

## 2020-02-26 ENCOUNTER — Other Ambulatory Visit: Payer: Self-pay | Admitting: Neurology

## 2020-02-26 ENCOUNTER — Other Ambulatory Visit: Payer: Self-pay | Admitting: Internal Medicine

## 2020-03-15 ENCOUNTER — Telehealth: Payer: Self-pay | Admitting: Family Medicine

## 2020-03-15 NOTE — Telephone Encounter (Signed)
Pt asking for a call to discuss propranolol ER (INDERAL LA) 60 MG 24 hr capsule making her very tired, asking to possibly change   EMGALITY 120 MG/ML SOAJ and wants to discuss concerns for memory loss, not being able to remember certain things. Pt has asked for a call 1st before scheduling an appointment.  Pt states if she cant be reached on her mobile to please call her husband's # [phone]

## 2020-03-15 NOTE — Telephone Encounter (Signed)
I spoke to pt and she is c/o worsening memory,(amnesic- forgets things.  I referred her to pcp for eval to r/o metabolic issues, but noticed was mentioned last visit.  She will keep diary of things that happen.  She is not taking propranolol due to fatigue. aimovig she does not like injections, told her to try abdomen injection may be less painful.  She will.  Has appt 05-16-20 at 1330.  Spoke about going to geriatric md , due to her age and all her medications.

## 2020-04-05 ENCOUNTER — Other Ambulatory Visit: Payer: Self-pay

## 2020-04-06 ENCOUNTER — Telehealth (INDEPENDENT_AMBULATORY_CARE_PROVIDER_SITE_OTHER): Payer: Medicare Other | Admitting: Internal Medicine

## 2020-04-06 ENCOUNTER — Encounter: Payer: Self-pay | Admitting: Internal Medicine

## 2020-04-06 VITALS — Wt 135.0 lb

## 2020-04-06 DIAGNOSIS — M79604 Pain in right leg: Secondary | ICD-10-CM

## 2020-04-06 DIAGNOSIS — M79605 Pain in left leg: Secondary | ICD-10-CM

## 2020-04-06 DIAGNOSIS — G894 Chronic pain syndrome: Secondary | ICD-10-CM | POA: Diagnosis not present

## 2020-04-06 NOTE — Progress Notes (Signed)
Virtual Visit via Telephone Note  I connected with Jill Salinas on 04/06/20 at  2:00 PM EST by telephone and verified that I am speaking with the correct person using two identifiers.   I discussed the limitations, risks, security and privacy concerns of performing an evaluation and management service by telephone and the availability of in person appointments. I also discussed with the patient that there may be a patient responsible charge related to this service. The patient expressed understanding and agreed to proceed.  Location patient: home Location provider: work office Participants present for the call: patient, provider Patient did not have a visit in the prior 7 days to address this/these issue(s).   History of Present Illness:  Initially she tells me that she is not quite sure why she has scheduled this visit.  She then proceeds to ask me whether she should continue to take vitamin B1 and what it is for.  Jill Salinas has had issues with chronic pain and leg pain.  I will insert an excerpt from one of my previous notes for details:  Jill Salinas continues to have what we believe is neuropathic pain.  She has burning, tingling type pain of both soles of her feet and both legs, worse on the left.  She has tried medications like gabapentin, Lyrica and amitriptyline in the past but cannot tolerate them.  She states "they make me crazy in the head", make her hyper to the point where she is not able to carry a conversation.  She is followed by chronic pain management specialist in [place] and is on a relatively high doses of Percocet.  She has a neurologist that she sees mainly for migraines.  She had an MRI of her lumbar spine in June 2020 that showed a shallow disc bulge at the L4-L5 level without significant stenosis or nerve root impingement, mild to moderate facet arthropathy L5-S1 on the left with minimal disc bulge at that level without stenosis.  She states she has tried "back injections"  which I can only imagine are epidural injections without any significant relief.  She states "I have lost my life to this pain", she finds it difficult to wake up in the mornings because of this.  She thinks she has may Thurner syndrome.  When I inquire why she believes this she states that "I have been reading a lot on the Internet about it, I believe that my leg pain is due to this".  Of note she has never been diagnosed with DVT before.   Observations/Objective: Patient sounds a little depressed on the phone. I do not appreciate any increased work of breathing. Speech and thought processing are grossly intact. Patient reported vitals: None reported   Current Outpatient Medications:  .  Buprenorphine 15 MCG/HR PTWK, as needed., Disp: , Rfl:  .  cyanocobalamin (,VITAMIN B-12,) 1000 MCG/ML injection, ADMINISTER 1 ML (1,000 MCG) INTRAMUSCULARLY MONTHLY  10 ML BOTTLE, Disp: 10 mL, Rfl: 1 .  denosumab (PROLIA) 60 MG/ML SOLN injection, Inject 60 mg into the skin every 6 (six) months. Administer in upper arm, thigh, or abdomen, Disp: , Rfl:  .  diclofenac sodium (VOLTAREN) 1 % GEL, Apply 2 g topically as needed., Disp: , Rfl:  .  EMGALITY 120 MG/ML SOAJ, INJECT 120 MG INTO THE SKIN EVERY 30 (THIRTY) DAYS., Disp: 1 mL, Rfl: 11 .  FLUoxetine (PROZAC) 40 MG capsule, Take 1 capsule (40 mg total) by mouth daily., Disp: 90 capsule, Rfl: 1 .  folic  acid (FOLVITE) 1 MG tablet, TOME UNA TABLETA TODOS LOS DIAS, Disp: 90 tablet, Rfl: 3 .  linaclotide (LINZESS) 290 MCG CAPS capsule, TAKE 1 CAPSULE BY MOUTH DAILY BEFORE BREAKFAST., Disp: 30 capsule, Rfl: 2 .  LUMIGAN 0.01 % SOLN, Place 1 drop into both eyes at bedtime., Disp: , Rfl:  .  Misc Natural Products (LEG VEIN & CIRCULATION PO), Take by mouth., Disp: , Rfl:  .  modafinil (PROVIGIL) 200 MG tablet, Take 200 mg by mouth daily as needed. , Disp: , Rfl: 1 .  Multiple Vitamins-Minerals (ONE-A-DAY VITACRAVES IMMUNITY PO), Take by mouth., Disp: , Rfl:  .   naratriptan (AMERGE) 2.5 MG tablet, Take 1 tablet (2.5 mg total) by mouth as needed for migraine. Take one (1) tablet at onset of headache; if returns or does not resolve, may repeat after 4 hours; do not exceed five (5) mg in 24 hours., Disp: 10 tablet, Rfl: 5 .  ondansetron (ZOFRAN) 4 MG tablet, TAKE 1 TABLET BY MOUTH EVERY 8 HOURS AS NEEDED, Disp: 20 tablet, Rfl: 0 .  polyethylene glycol (MIRALAX / GLYCOLAX) packet, Take 17 g by mouth daily., Disp: , Rfl:  .  tretinoin (RETIN-A) 0.025 % cream, APPLY TOPICALLY AT BEDTIME., Disp: 45 g, Rfl: 1 .  triamcinolone (KENALOG) 0.1 %, APPLY TO CHIN DAILY AT BEDTIME AS NEEDED, Disp: 30 g, Rfl: 0 .  vitamin B-12 (CYANOCOBALAMIN) 1000 MCG tablet, Take 1,000 mcg by mouth daily., Disp: , Rfl:  .  Vitamin D, Ergocalciferol, (DRISDOL) 1.25 MG (50000 UNIT) CAPS capsule, Take 1 capsule (50,000 Units total) by mouth every 7 (seven) days., Disp: 15 capsule, Rfl: 3 .  zolpidem (AMBIEN) 5 MG tablet, Take 5 mg by mouth at bedtime as needed for sleep., Disp: , Rfl:  .  oxyCODONE-acetaminophen (PERCOCET) 10-325 MG tablet, Filled by pain clinic, Disp: , Rfl: 0 .  propranolol ER (INDERAL LA) 60 MG 24 hr capsule, Take 1 capsule (60 mg total) by mouth daily. (Patient not taking: Reported on 04/06/2020), Disp: 90 capsule, Rfl: 3 .  thiamine 100 MG tablet, TAKE 1 TABLET BY MOUTH EVERY DAY (Patient not taking: Reported on 04/06/2020), Disp: 90 tablet, Rfl: 3  Review of Systems:  Constitutional: Denies fever, chills, diaphoresis, appetite change and fatigue.  HEENT: Denies photophobia, eye pain, redness, hearing loss, ear pain, congestion, sore throat, rhinorrhea, sneezing, mouth sores, trouble swallowing, neck pain, neck stiffness and tinnitus.   Respiratory: Denies SOB, DOE, cough, chest tightness,  and wheezing.   Cardiovascular: Denies chest pain, palpitations and leg swelling.  Gastrointestinal: Denies nausea, vomiting, abdominal pain, diarrhea, constipation, blood in stool  and abdominal distention.  Genitourinary: Denies dysuria, urgency, frequency, hematuria, flank pain and difficulty urinating.  Endocrine: Denies: hot or cold intolerance, sweats, changes in hair or nails, polyuria, polydipsia. Musculoskeletal: Positive for myalgias, back pain, joint swelling, arthralgias and gait problem.  Skin: Denies pallor, rash and wound.  Neurological: Denies dizziness, seizures, syncope, weakness, light-headedness, numbness and headaches.  Hematological: Denies adenopathy. Easy bruising, personal or family bleeding history  Psychiatric/Behavioral: Denies suicidal ideation, mood changes, confusion, nervousness, sleep disturbance and agitation   Assessment and Plan:  Pain in both lower extremities Chronic pain syndrome  -As I have explained to her before, I am out of options to treat her chronic pain.  She remains under the care of a pain management specialist and a neurologist and have encouraged her to follow-up with them to see what other options for treating her pain may be present. -I  do not believe she has May Thurner syndrome.  She has never been diagnosed with a DVT in the past, I do not think we need to work her up for this. -I have explained to her what vitamin B1/thiamine is.  It is not a bad idea for her to continue.    I discussed the assessment and treatment plan with the patient. The patient was provided an opportunity to ask questions and all were answered. The patient agreed with the plan and demonstrated an understanding of the instructions.   The patient was advised to call back or seek an in-person evaluation if the symptoms worsen or if the condition fails to improve as anticipated.  I provided 14 minutes of non-face-to-face time during this encounter.   Lelon Frohlich, MD [facility]

## 2020-05-16 ENCOUNTER — Ambulatory Visit: Payer: Medicare Other | Admitting: Family Medicine

## 2020-05-25 ENCOUNTER — Ambulatory Visit: Payer: Medicare Other | Admitting: Internal Medicine

## 2020-06-02 ENCOUNTER — Other Ambulatory Visit: Payer: Self-pay | Admitting: Neurosurgery

## 2020-06-02 DIAGNOSIS — R1030 Lower abdominal pain, unspecified: Secondary | ICD-10-CM

## 2020-06-02 DIAGNOSIS — G959 Disease of spinal cord, unspecified: Secondary | ICD-10-CM

## 2020-06-07 LAB — HM MAMMOGRAPHY

## 2020-06-08 ENCOUNTER — Encounter: Payer: Self-pay | Admitting: Internal Medicine

## 2020-06-08 ENCOUNTER — Telehealth: Payer: Self-pay

## 2020-06-08 MED ORDER — DIPHENHYDRAMINE HCL 50 MG PO TABS: 50.0000 mg | ORAL_TABLET | Freq: Once | ORAL | 0 refills | Status: DC

## 2020-06-08 MED ORDER — PREDNISONE 50 MG PO TABS: ORAL_TABLET | ORAL | 0 refills | Status: AC

## 2020-06-08 NOTE — Telephone Encounter (Signed)
Phone call to patient to review instructions for 13 hr prep for CT w/ contrast on 06/19/20  at 2:40 PM. Prescription called into [facility], prednisone and benadryl. Pt aware and verbalized understanding of instructions.  Prescription: Pt to take 50 mg of prednisone on 06/19/20 at 1:40 AM, 50 mg of prednisone on 06/19/20 at 7:40 AM, and 50 mg of prednisone on 06/19/20 at 1:40 PM. Pt is also to take 50 mg of benadryl on 06/19/20 at 1:40 PM. Please call [phone] with any questions.  Pt also advised to have a driver the day of taking these medications as the benadryl may make her drowsy. Pt verbalized understanding.

## 2020-06-19 ENCOUNTER — Ambulatory Visit
Admission: RE | Admit: 2020-06-19 | Discharge: 2020-06-19 | Disposition: A | Discharge status: Home / Self Care | Payer: Medicare Other | Source: Ambulatory Visit | Attending: Neurosurgery | Admitting: Neurosurgery

## 2020-06-19 ENCOUNTER — Other Ambulatory Visit: Payer: Self-pay

## 2020-06-19 DIAGNOSIS — G959 Disease of spinal cord, unspecified: Secondary | ICD-10-CM

## 2020-06-19 DIAGNOSIS — R1030 Lower abdominal pain, unspecified: Secondary | ICD-10-CM

## 2020-06-19 IMAGING — CT CT ABD-PELV W/ CM
2 of 3 series · 13 of 32 positions shown, 19 images · IV contrast (APPLIED)
Comparison: CT abdomen pelvis dated [DATE].

CLINICAL DATA: 74-year-old female with lower abdominal pain and
cramping. History of right breast cancer and lumpectomy and prior
chemotherapy.

EXAM:
CT ABDOMEN AND PELVIS WITH CONTRAST
TECHNIQUE: Multidetector CT imaging of the abdomen and pelvis was performed
using the standard protocol following bolus administration of
intravenous contrast.
CONTRAST:  100mL [NR] IOPAMIDOL ([NR]) INJECTION 61%

[Series 2: abd/pelvis w/cm · axial · 0.68mm/px · z∈[-406,-61]mm · 11 of 83 slices shown, 17 images]
[im 7/83  soft-tissue]
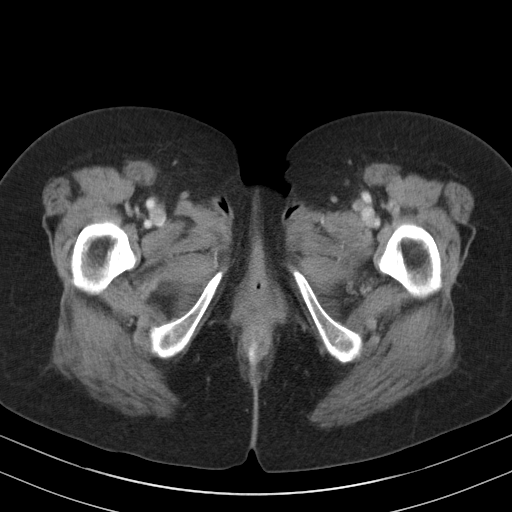
[im 7/83  bone]
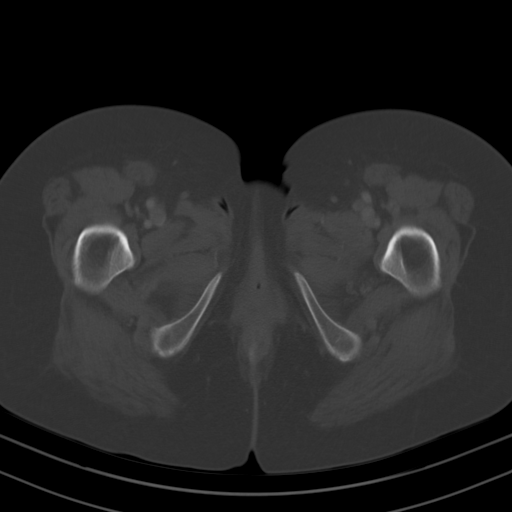
[im 14/83  soft-tissue]
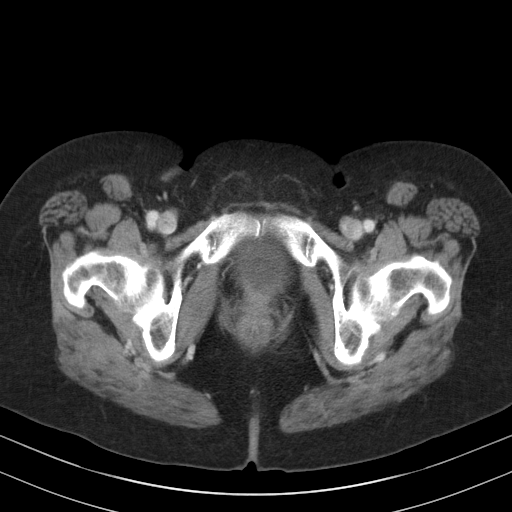
[im 21/83  soft-tissue]
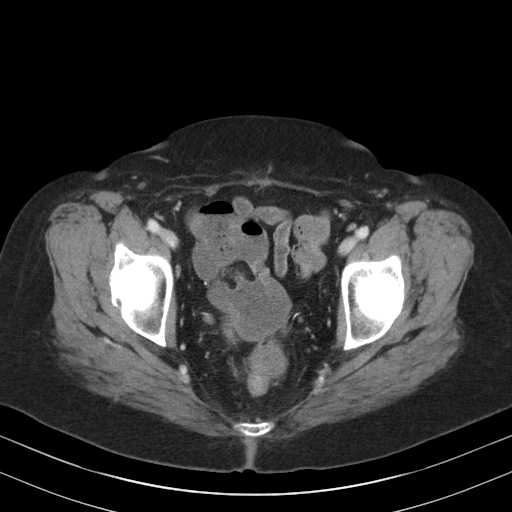
[im 28/83  soft-tissue]
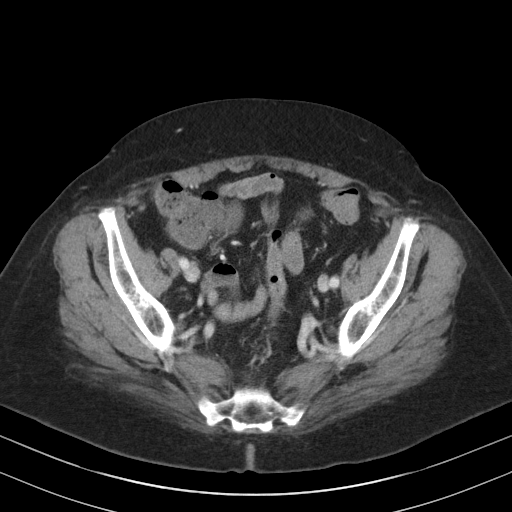
[im 35/83  soft-tissue]
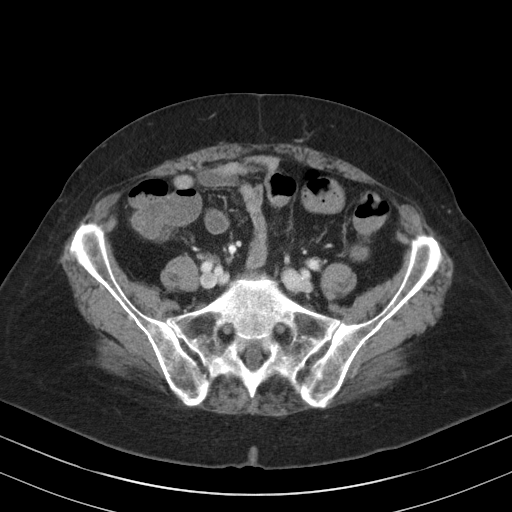
[im 42/83  soft-tissue]
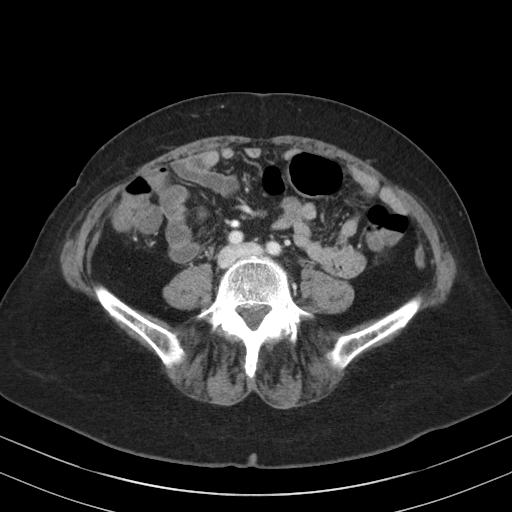
[im 48/83  soft-tissue]
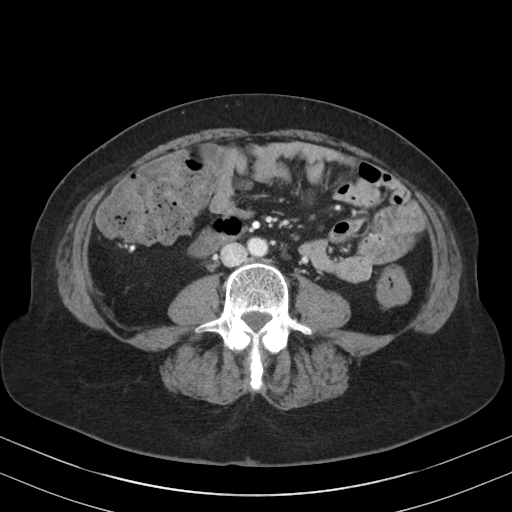
[im 55/83  soft-tissue]
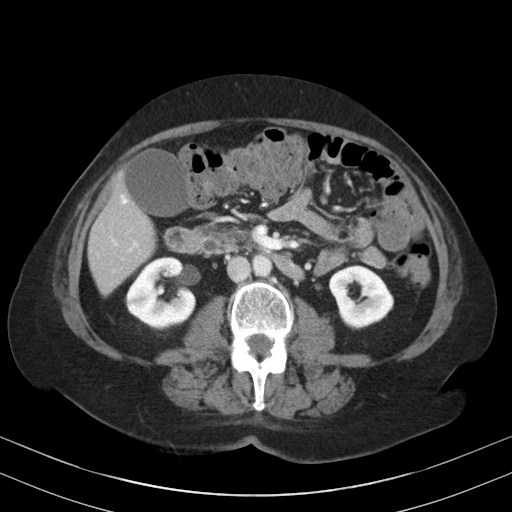
[im 55/83  lung]
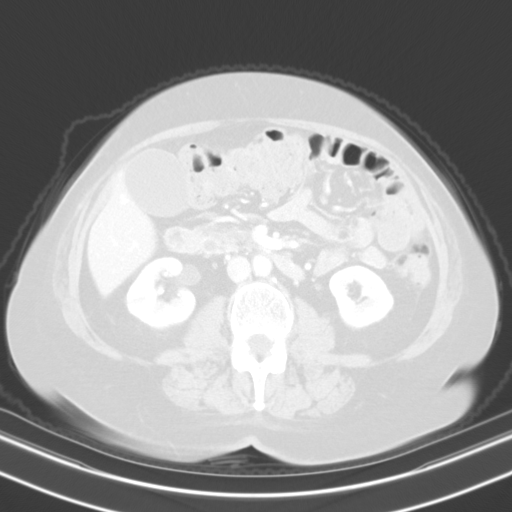
[im 62/83  soft-tissue]
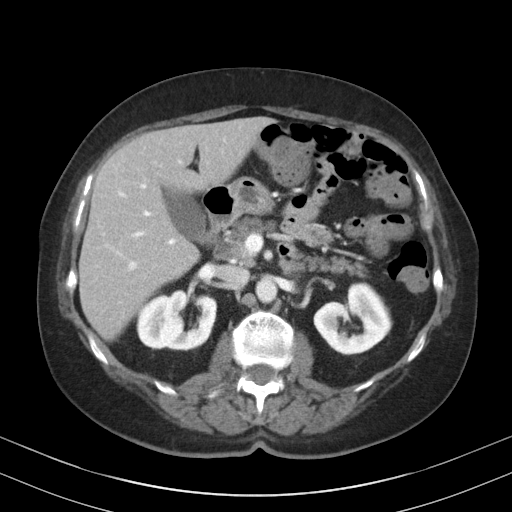
[im 62/83  lung]
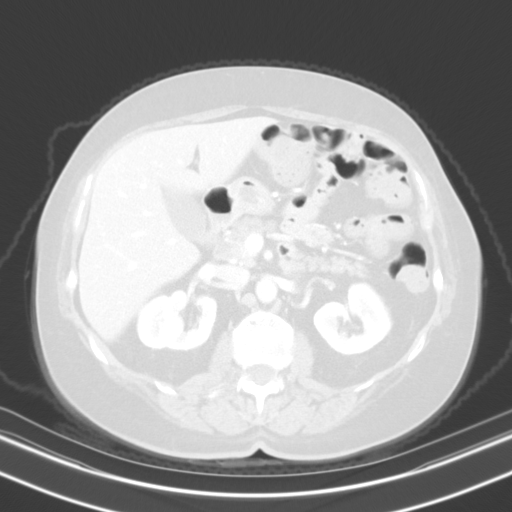
[im 62/83  bone]
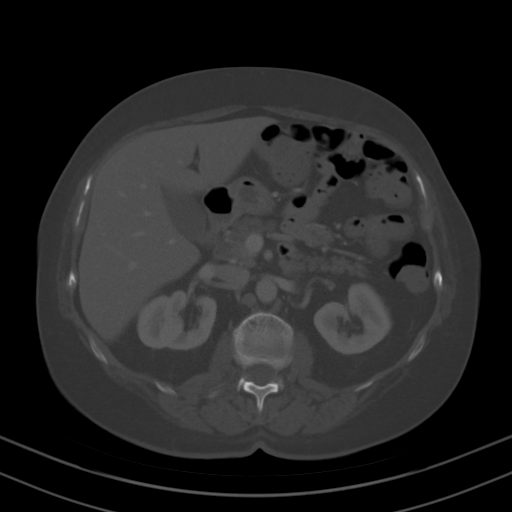
[im 69/83  soft-tissue]
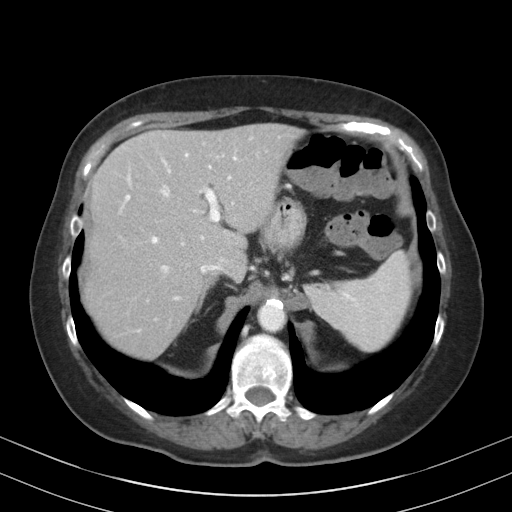
[im 69/83  lung]
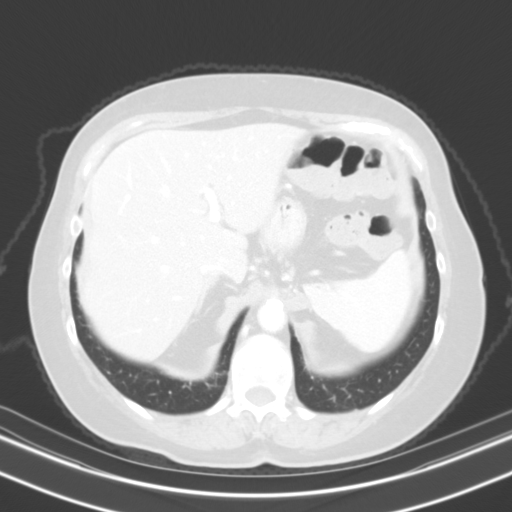
[im 76/83  soft-tissue]
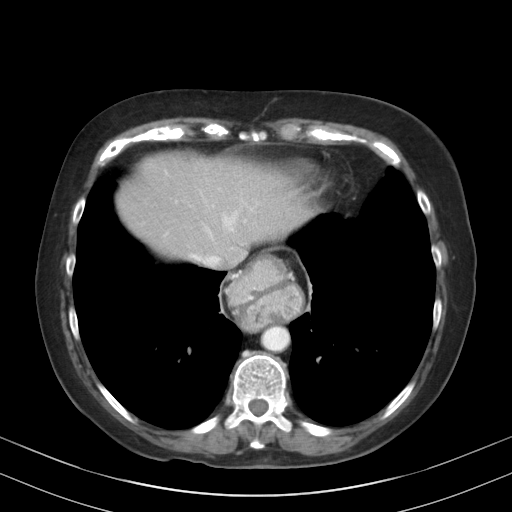
[im 76/83  lung]
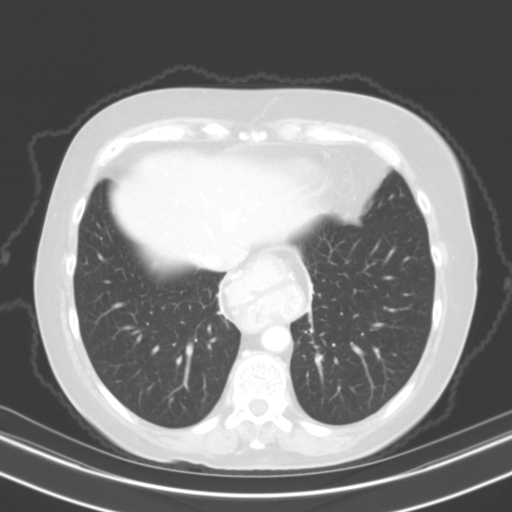

[Series 5: lung · axial · 0.68mm/px · z∈[-176,-152]mm · 2 of 82 slices shown]
[im 7/82  bone]
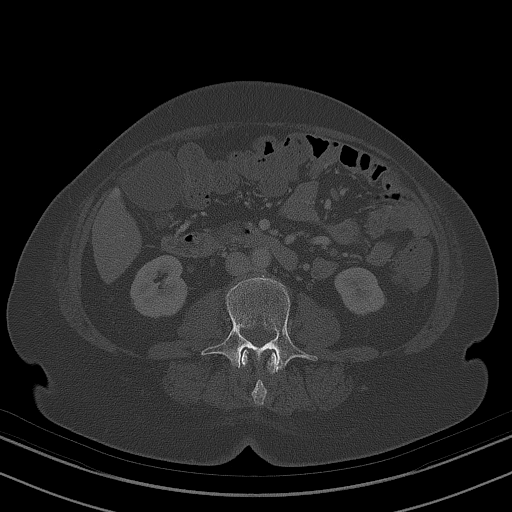
[im 19/82  bone]
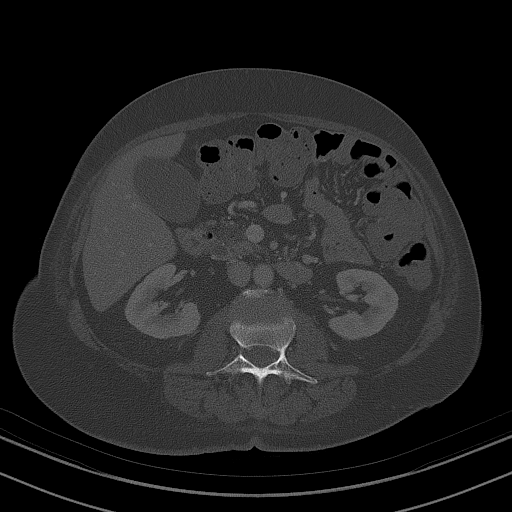

[13 of 32 positions shown; findings below may reference images not displayed]

FINDINGS: Lower chest: Left lung base linear atelectasis/scarring. The
visualized lung bases are otherwise clear.

No intra-abdominal free air or free fluid.

Hepatobiliary: No focal liver abnormality is seen. No gallstones,
gallbladder wall thickening, or biliary dilatation.

Pancreas: Unremarkable. No pancreatic ductal dilatation or
surrounding inflammatory changes.

Spleen: Normal in size without focal abnormality.

Adrenals/Urinary Tract: The adrenal glands unremarkable.
Subcentimeter right renal hypodense focus is not characterized.
There is no hydronephrosis on either side. There is symmetric
enhancement and excretion of contrast by both kidneys. The
visualized ureters, and urinary bladder appear unremarkable.

Stomach/Bowel: There is a moderate size hiatal hernia. There is no
bowel obstruction or active inflammation. The appendix is not
visualized with certainty. No inflammatory changes identified in the
right lower quadrant.

Vascular/Lymphatic: Mild aortoiliac atherosclerotic disease. The IVC
is unremarkable. No portal venous gas. There is no adenopathy.

Reproductive: Hysterectomy.  No adnexal masses.

Other: Indeterminate faint nodular stranding of the presacral fat
similar to prior CT and chronic. No fluid collection.

Musculoskeletal: Osteopenia.  No acute osseous pathology.
IMPRESSION: 1. No acute intra-abdominal or pelvic pathology.
2. Moderate size hiatal hernia.
3. Aortic Atherosclerosis ([NR]-[NR]).

## 2020-06-19 IMAGING — MR MR THORACIC SPINE W/O CM
4 of 5 series · 18 of 48 positions shown · non-contrast
Comparison: [DATE]

CLINICAL DATA: Back pain with bilateral leg weakness

EXAM:
MRI THORACIC SPINE WITHOUT CONTRAST
TECHNIQUE: Multiplanar, multisequence MR imaging of the thoracic spine was
performed. No intravenous contrast was administered.

[Series 4: T2 · sagittal · 4.0mm · 0.53mm/px · 6 of 16 slices shown (1 of 3)]
[im 1/16]
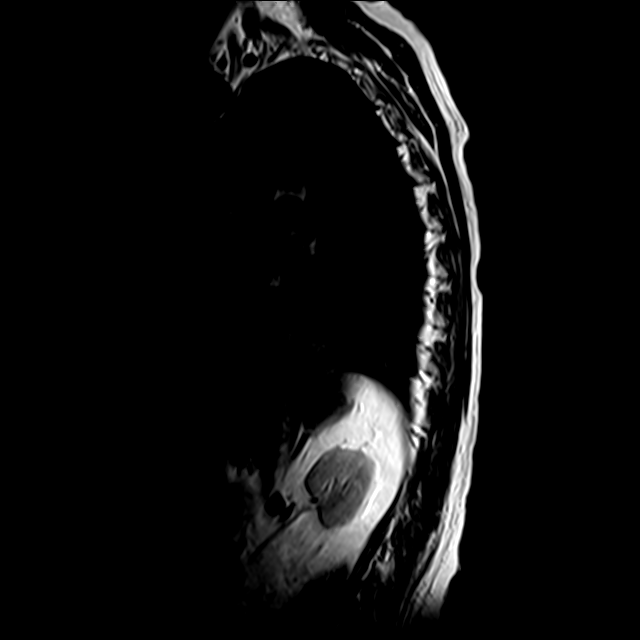
[im 4/16]
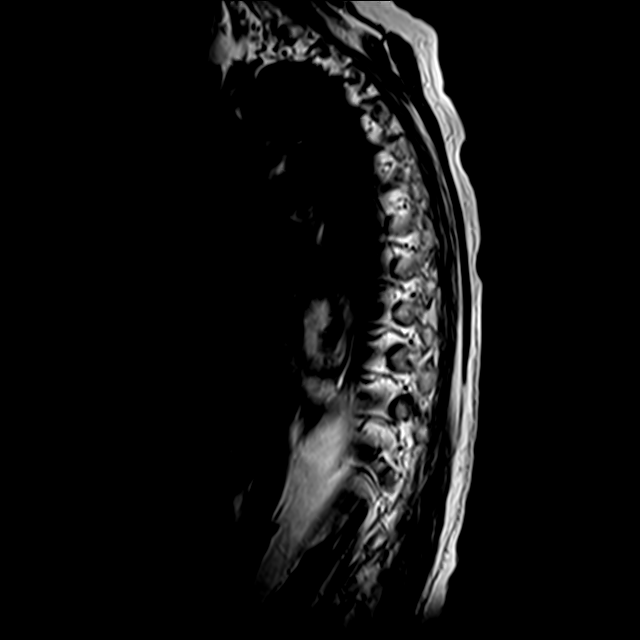
[im 7/16]
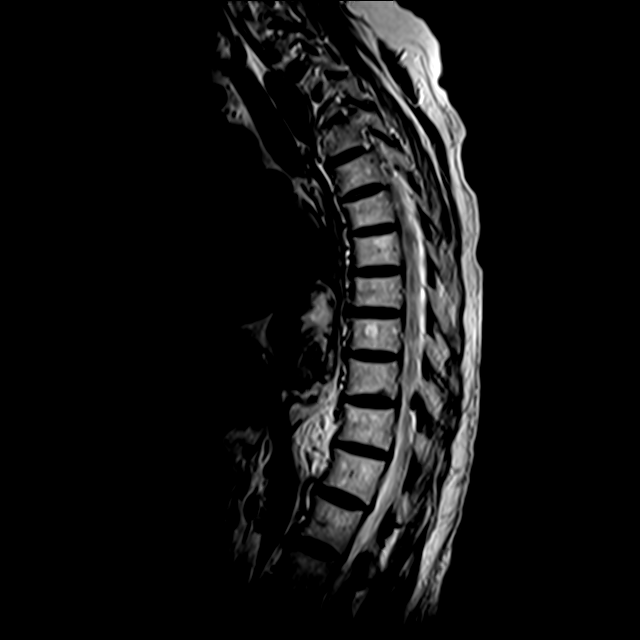
[im 10/16]
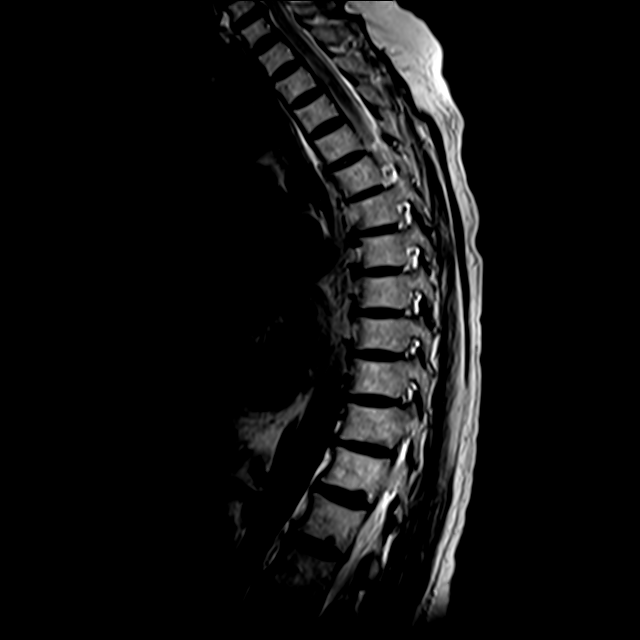
[im 13/16]
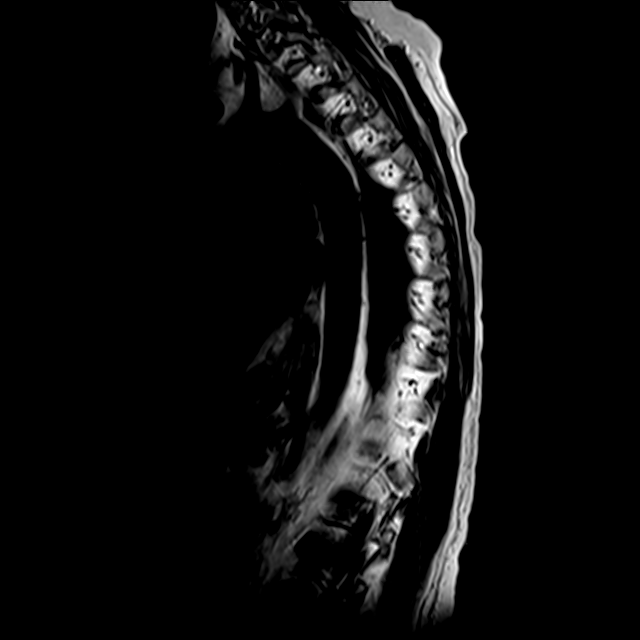
[im 16/16]
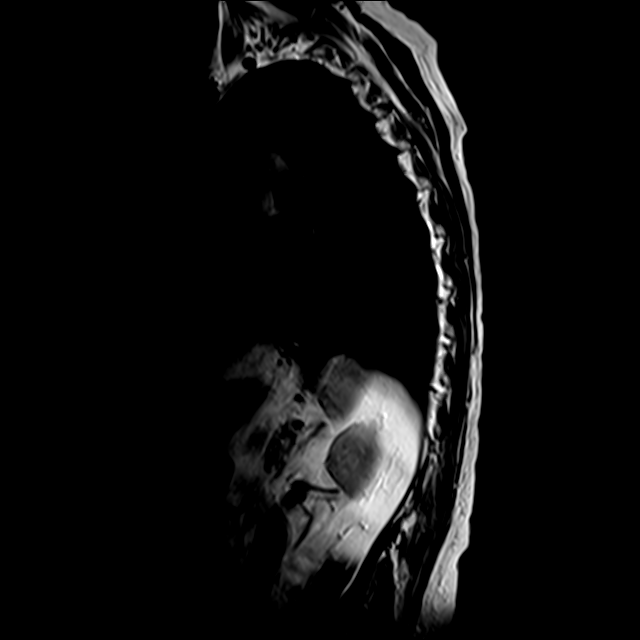

[Series 6: T1 · sagittal · 4.0mm · 1.06mm/px · 3 of 16 slices shown]
[im 4/16]
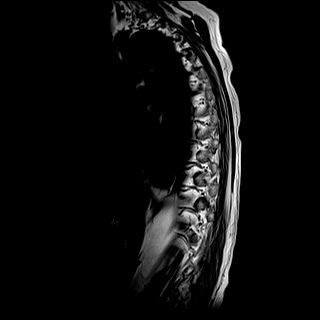
[im 10/16]
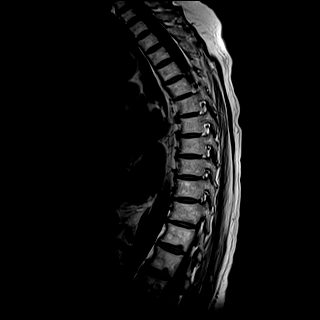
[im 16/16]
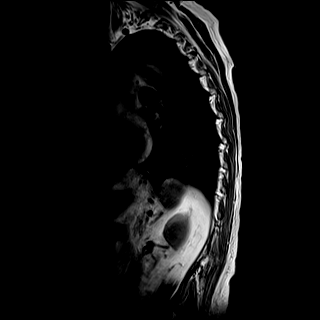

[Series 7: T2 · axial · 4.0mm · 0.39mm/px · z∈[-204,-51]mm · 6 of 39 slices shown (2 of 3)]
[im 1/39]
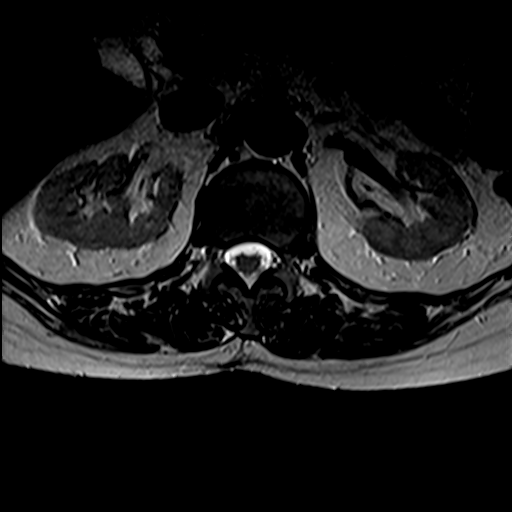
[im 6/39]
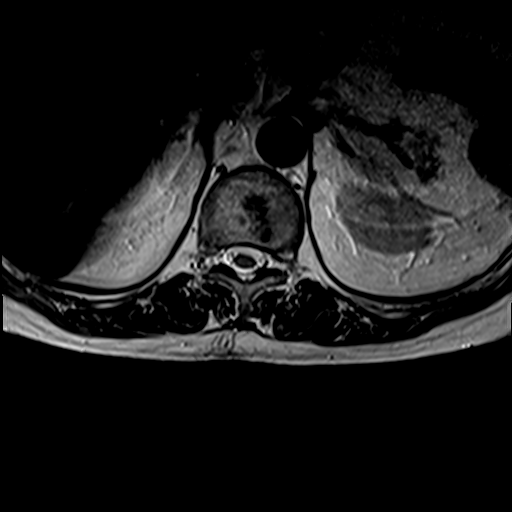
[im 11/39]
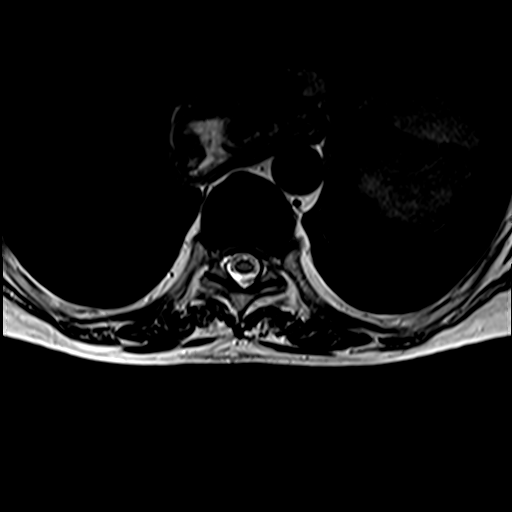
[im 17/39]
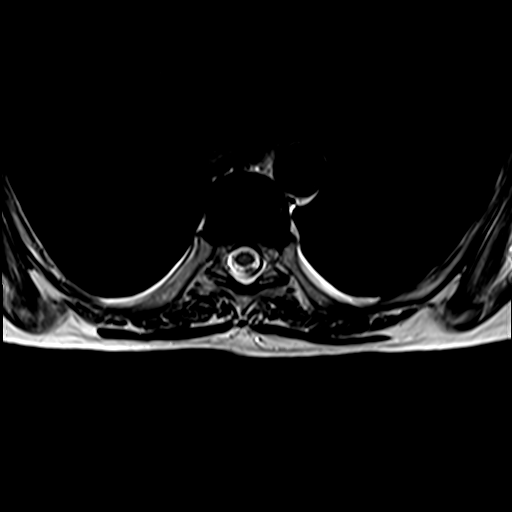
[im 20/39]
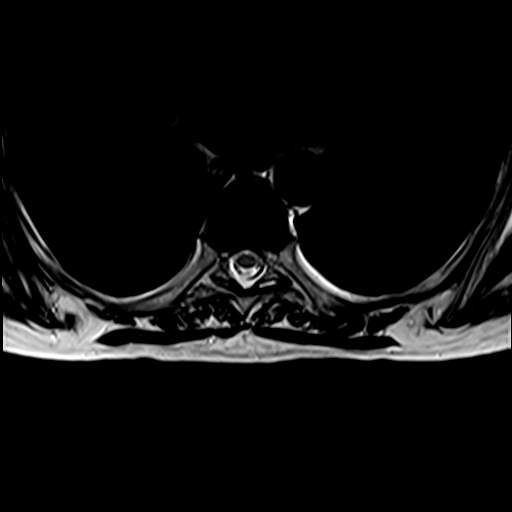
[im 33/39]
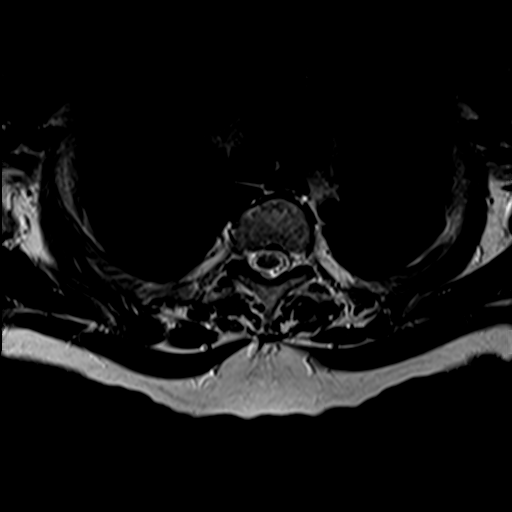

[Series 8: T2 · axial · 4.0mm · 0.39mm/px · z∈[-174,-51]mm · 3 of 39 slices shown (3 of 3)]
[im 6/39]
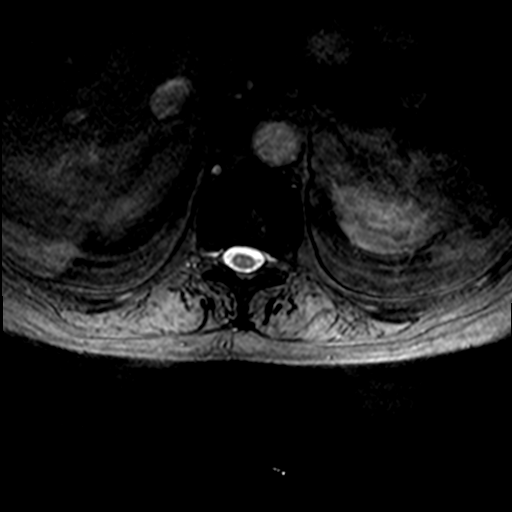
[im 20/39]
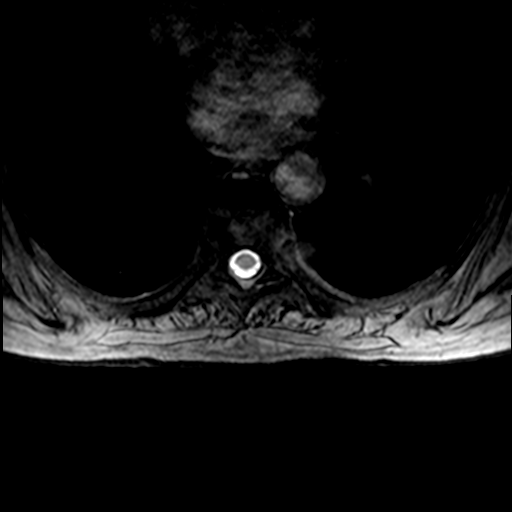
[im 33/39]
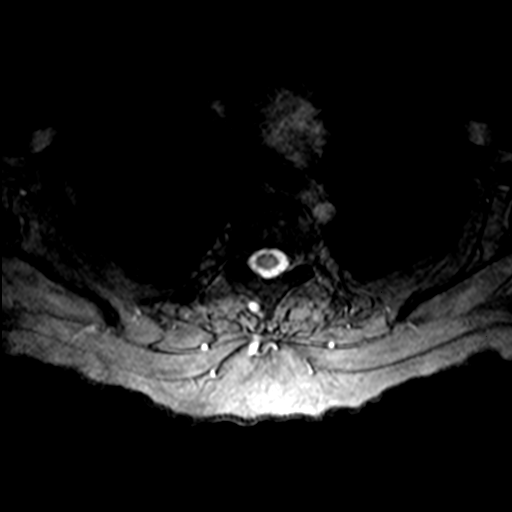

[18 of 48 positions shown; findings below may reference images not displayed]

FINDINGS: Alignment:  Exaggerated thoracic kyphosis.

Vertebrae: No fracture, evidence of discitis, or bone lesion.

Cord:  Normal signal and morphology.

Paraspinal and other soft tissues: Hiatal hernia

Disc levels:

Generalized disc space narrowing and desiccation. No herniation or
impingement. Intermittent mild facet spurring most notable at T10-11
on the right.
IMPRESSION: Ordinary and generalized thoracic spine degeneration without
detected progression from [HZ]. No neural impingement or visible
inflammation.

## 2020-06-19 MED ORDER — IOPAMIDOL (ISOVUE-300) INJECTION 61%
100.0000 mL | Freq: Once | INTRAVENOUS | Status: AC | PRN
  Administered 2020-06-19: 100 mL via INTRAVENOUS

## 2020-06-22 ENCOUNTER — Other Ambulatory Visit: Payer: Self-pay

## 2020-06-22 ENCOUNTER — Encounter: Payer: Self-pay | Admitting: Internal Medicine

## 2020-06-22 ENCOUNTER — Ambulatory Visit (INDEPENDENT_AMBULATORY_CARE_PROVIDER_SITE_OTHER): Payer: Medicare Other | Admitting: Internal Medicine

## 2020-06-22 VITALS — BP 148/100 | HR 87 | Ht 60.0 in | Wt 138.8 lb

## 2020-06-22 DIAGNOSIS — M818 Other osteoporosis without current pathological fracture: Secondary | ICD-10-CM | POA: Diagnosis not present

## 2020-06-22 NOTE — Patient Instructions (Signed)
Please stop at the lab.  Continue ergocalciferol 50,000 units weekly.  Continue Prolia.   Try to get ~1200 mg calcium ideally from diet.  Try to get ~50 g proteins a day.  Please come back for a follow-up appointment in 1 year.

## 2020-06-22 NOTE — Progress Notes (Signed)
Patient ID: Jill Salinas, female   DOB: 1945-03-30, 75 y.o.   MRN: [id number]   This visit occurred during the SARS-CoV-2 public health emergency.  Safety protocols were in place, including screening questions prior to the visit, additional usage of staff PPE, and extensive cleaning of exam room while observing appropriate contact time as indicated for disinfecting solutions.   HPI  [name] is a 75 y.o.-year-old female, initially referred by her PCP, Dr. Jerilee Hoh, returning for follow-up for osteoporosis (OP).  Last visit 1 year ago. She saw Endocrinologist in [place].  Interim history: No fractures or falls since last visit. She feels very tired, also has leg weakness. She will see Dr. Oneida Alar.  Reviewed and addended history: Pt was dx with OP >10 years ago.  I reviewed pt's DXA scan reports: Date L1-L4 T score FN T score 33% distal Radius  06/07/2019 ([facility])  -2.2 RFN: -2.8 LFN: -2.9 n/a   No dizziness/vertigo/orthostasis/poor vision. She has imbalance and has pain in her legs - knees. Also swelling and pain in legs.  Previous OP treatments:  - po Bisphosphonate: Fosamax - no significant effect - Forteo several years ago  - Prolia since started 2004-2005 years ago - until 09/2018 >> generalized bone pain - Restarted Prolia 07/2019  No h/o vitamin D deficiency. Reviewed available vit D levels: Lab Results  Component Value Date   VD25OH 80.41 06/22/2019   VD25OH 78.31 09/16/2018   VD25OH 67.5 05/12/2017   Pt is on:  - vitamin D (ergocalciferol) 50,000 units once a week   No weight bearing exercises 2/2 joint pain.  She does not take high vitamin A doses.  Of note, she has a history of steroid injections in the spine. Also, as a child - she had eczema so she was on steroids long-term: im for many years. She is on oxycodone and seen in pain management clinic for bone and joint pains.  Menopause was at 75 y/o (surgical). No HRT.  FH of osteoporosis: Mother and  father-both with hip fractures.  No h/o hyper/hypocalcemia or hyperparathyroidism. No h/o kidney stones. Lab Results  Component Value Date   CALCIUM 9.8 11/25/2019   CALCIUM 9.9 06/22/2019   CALCIUM 9.6 09/16/2018   CALCIUM 9.4 11/21/2015   No h/o thyrotoxicosis. Reviewed TSH recent levels:  Lab Results  Component Value Date   TSH 3.49 09/16/2018   TSH 2.290 05/12/2017   No h/o CKD. Last BUN/Cr: Lab Results  Component Value Date   BUN 12 11/25/2019   CREATININE 0.93 11/25/2019   She has a history of GERD and is on Nexium.  She has a history of breast cancer, s/p chemotherapy and radiation therapy.  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  ROS: Constitutional: no weight gain/no weight loss, + fatigue Eyes: no blurry vision, no xerophthalmia ENT: no sore throat, no nodules palpated in neck, no dysphagia, no odynophagia, no hoarseness Cardiovascular: no CP/no SOB/no palpitations/+ leg swelling Respiratory: no cough/no SOB/no wheezing Gastrointestinal: no N/no V/no D/no C/+ acid reflux Musculoskeletal: + muscle aches/+ joint aches Skin: no rashes, + hair loss Neurological: no tremors/no numbness/no tingling/no dizziness  I reviewed pt's medications, allergies, PMH, social hx, family hx, and changes were documented in the history of present illness. Otherwise, unchanged from my initial visit note.  Past Medical History:  Diagnosis Date  . Bowel obstruction ([place])   . Breast cancer ([place])   . Headache(784.0)   .  Migraine   . Vision abnormalities    glaucoma   Past Surgical History:  Procedure Laterality Date  . BREAST LUMPECTOMY    . TOTAL ABDOMINAL HYSTERECTOMY N/A    Social History   Socioeconomic History  . Marital status: Married    Spouse name: Jill Salinas   . Number of children: 0  . Years of education: Assoc  . Highest education level: Not on file  Occupational History  . Occupation: retired  Tobacco Use  .  Smoking status: Never Smoker  . Smokeless tobacco: Never Used  Vaping Use  . Vaping Use: Never used  Substance and Sexual Activity  . Alcohol use: Yes    Alcohol/week: 2.0 standard drinks    Types: 2 Standard drinks or equivalent per week    Comment: drinks on the weekend  . Drug use: No  . Sexual activity: Not on file  Other Topics Concern  . Not on file  Social History Narrative   Patient lives at home with her husband. Jill Salinas    Patient has an Geophysicist/field seismologist.    Patient has no children.    Patient is right handed.    Social Determinants of Health   Financial Resource Strain: Not on file  Food Insecurity: Not on file  Transportation Needs: Not on file  Physical Activity: Not on file  Stress: Not on file  Social Connections: Not on file  Intimate Partner Violence: Not on file   Current Outpatient Medications on File Prior to Visit  Medication Sig Dispense Refill  . Buprenorphine 15 MCG/HR PTWK as needed.    . cyanocobalamin (,VITAMIN B-12,) 1000 MCG/ML injection ADMINISTER 1 ML (1,000 MCG) INTRAMUSCULARLY MONTHLY  10 ML BOTTLE 10 mL 1  . denosumab (PROLIA) 60 MG/ML SOLN injection Inject 60 mg into the skin every 6 (six) months. Administer in upper arm, thigh, or abdomen    . diclofenac sodium (VOLTAREN) 1 % GEL Apply 2 g topically as needed.    . diphenhydrAMINE (BENADRYL) 50 MG tablet Take 1 tablet (50 mg total) by mouth once for 1 dose. Pt to take 50 mg of Benadryl on 06/19/20 @ 1:40 PM. Please call [phone] with any questions. 30 tablet 0  . EMGALITY 120 MG/ML SOAJ INJECT 120 MG INTO THE SKIN EVERY 30 (THIRTY) DAYS. 1 mL 11  . FLUoxetine (PROZAC) 40 MG capsule Take 1 capsule (40 mg total) by mouth daily. 90 capsule 1  . folic acid (FOLVITE) 1 MG tablet TOME UNA TABLETA TODOS LOS DIAS 90 tablet 3  . linaclotide (LINZESS) 290 MCG CAPS capsule TAKE 1 CAPSULE BY MOUTH DAILY BEFORE BREAKFAST. 30 capsule 2  . LUMIGAN 0.01 % SOLN Place 1 drop into both eyes at bedtime.     . Misc Natural Products (LEG VEIN & CIRCULATION PO) Take by mouth.    . modafinil (PROVIGIL) 200 MG tablet Take 200 mg by mouth daily as needed.   1  . Multiple Vitamins-Minerals (ONE-A-DAY VITACRAVES IMMUNITY PO) Take by mouth.    . naratriptan (AMERGE) 2.5 MG tablet Take 1 tablet (2.5 mg total) by mouth as needed for migraine. Take one (1) tablet at onset of headache; if returns or does not resolve, may repeat after 4 hours; do not exceed five (5) mg in 24 hours. 10 tablet 5  . ondansetron (ZOFRAN) 4 MG tablet TAKE 1 TABLET BY MOUTH EVERY 8 HOURS AS NEEDED 20 tablet 0  . oxyCODONE-acetaminophen (PERCOCET) 10-325 MG tablet Filled by pain clinic  0  .  polyethylene glycol (MIRALAX / GLYCOLAX) packet Take 17 g by mouth daily.    . predniSONE (DELTASONE) 50 MG tablet Pt to take 50 mg of prednisone on 06/19/20 at 1:40 AM, 50 mg of prednisone on 06/19/20 at 7:40 AM, and 50 mg of prednisone on 06/19/20 at 1:40 PM. Pt is also to take 50 mg of benadryl on 06/19/20 at 1:40 PM. Please call [phone] with any questions. 3 tablet 0  . propranolol ER (INDERAL LA) 60 MG 24 hr capsule Take 1 capsule (60 mg total) by mouth daily. (Patient not taking: Reported on 04/06/2020) 90 capsule 3  . thiamine 100 MG tablet TAKE 1 TABLET BY MOUTH EVERY DAY (Patient not taking: Reported on 04/06/2020) 90 tablet 3  . tretinoin (RETIN-A) 0.025 % cream APPLY TOPICALLY AT BEDTIME. 45 g 1  . triamcinolone (KENALOG) 0.1 % APPLY TO CHIN DAILY AT BEDTIME AS NEEDED 30 g 0  . vitamin B-12 (CYANOCOBALAMIN) 1000 MCG tablet Take 1,000 mcg by mouth daily.    . Vitamin D, Ergocalciferol, (DRISDOL) 1.25 MG (50000 UNIT) CAPS capsule Take 1 capsule (50,000 Units total) by mouth every 7 (seven) days. 15 capsule 3  . zolpidem (AMBIEN) 5 MG tablet Take 5 mg by mouth at bedtime as needed for sleep.     No current facility-administered medications on file prior to visit.   Allergies  Allergen Reactions  . Depakote [Divalproex Sodium] Nausea And  Vomiting  . Gabapentin Other (See Comments)    Made her "feel crazy"  . Iodinated Diagnostic Agents Itching    Throat itching, pt received a 13 hour prep for injection  . Codeine   . Nortriptyline Other (See Comments)    constipation   Family History  Problem Relation Age of Onset  . Heart disease Mother   . Headache Mother   . Heart attack Father     PE: BP (!) 148/100 (BP Location: Left Arm, Patient Position: Sitting, Cuff Size: Normal)   Pulse 87   Ht 5' (1.524 m)   Wt 138 lb 12.8 oz (63 kg)   SpO2 99%   BMI 27.11 kg/m  Wt Readings from Last 3 Encounters:  06/22/20 138 lb 12.8 oz (63 kg)  04/06/20 135 lb (61.2 kg)  11/25/19 125 lb 11.2 oz (57 kg)   Constitutional: Normal weight, in NAD. No kyphosis. Eyes: PERRLA, EOMI, no exophthalmos ENT: moist mucous membranes, no thyromegaly, no cervical lymphadenopathy Cardiovascular: RRR, No MRG Respiratory: CTA B Gastrointestinal: abdomen soft, NT, ND, BS+ Musculoskeletal: no deformities, strength intact in all 4 Skin: moist, warm, no rashes Neurological: + mild  tremor with outstretched hands, DTR normal in all 4  Assessment: 1. Osteoporosis  Plan: 1. Osteoporosis -Likely postmenopausal (she had early, surgical, menopause)/ age-related and she also has family history of osteoporosis and a long history of steroid use (IM injections and also spine injections). -Latest DXA scan was reviewed and she had an increased risk for fractures based on the T-scores. -She had no falls or fractures since last visit -We again discussed fall precautions.  I also recommended exercise a higher protein diet (approximately 50 g daily for her -but ideally plant, not animal protein), and at last visit we discussed about the concept of low acid eating -At last visit we discussed about osteoporotic medication regimen I suggested to start Prolia after discussion about benefits and possible side effects. -She got her first Prolia injection 07/2019,  which she tolerated well -Unfortunately, she did not continue with the injections as  she had back and left thigh pain.  She had imaging: MRI of thoracic spine and also CT abdomen which did not show fractures.  Of note, she is followed by pain management in [place] and is on Percocet.  She did not tolerate Neurontin, Lyrica, or other medicines -At this visit, we discussed about resuming Prolia and I strongly advised her not to miss Prolia or delayed for more than 1 month - Will check a vitamin D level today and if normal, will proceed with a new Prolia injection.  Latest kidney function was normal 11/2019 -Plan to check a new DXA scan 2 years from the previous, 05/2021- the first indication that the treatment is working is her not having fractures. DXA scan changes are secondary: unchanged or slightly higher T-scores are desirable - will see pt back in 1 year  Component     Latest Ref Rng & Units 06/22/2020  Vitamin D, 25-Hydroxy     30.0 - 100.0 ng/mL 62.4  Normal vitamin D.  We will try to start her back on Prolia ASAP.  Philemon Kingdom, MD PhD [facility]

## 2020-06-23 ENCOUNTER — Encounter: Payer: Self-pay | Admitting: Internal Medicine

## 2020-06-23 LAB — VITAMIN D 25 HYDROXY (VIT D DEFICIENCY, FRACTURES): Vit D, 25-Hydroxy: 62.4 ng/mL (ref 30.0–100.0)

## 2020-06-26 ENCOUNTER — Telehealth: Payer: Self-pay

## 2020-06-26 NOTE — Telephone Encounter (Signed)
Prolia VOB initiated via [email or web address] 

## 2020-06-26 NOTE — Progress Notes (Signed)
Prolia VOB initiated  °

## 2020-07-05 NOTE — Telephone Encounter (Signed)
VOB pending 

## 2020-07-17 ENCOUNTER — Other Ambulatory Visit: Payer: Self-pay | Admitting: Internal Medicine

## 2020-07-17 DIAGNOSIS — F339 Major depressive disorder, recurrent, unspecified: Secondary | ICD-10-CM

## 2020-07-17 DIAGNOSIS — G894 Chronic pain syndrome: Secondary | ICD-10-CM

## 2020-07-23 NOTE — Telephone Encounter (Signed)
Pt ready for scheduling  Out-of-pocket cost due at time of visit: $0.00  Primary: Medicare Prolia co-insurance: 20% ($255)  Admin fee co-insurance: 20% ($25)  Secondary: [employer] BCBS Prolia co-insurance: will consider the Medicare Part B co-insurance but does not cover the Medicare Part B deductible Admin fee co-insurance: will consider the Medicare Part B co-insurance but does not cover the Medicare Part B deductible  Deductible: $233 deductible ($295 met)  Prior Auth: n/a PA# Valid:

## 2020-07-24 NOTE — Telephone Encounter (Signed)
LMTCB to schedule prolia

## 2020-08-04 ENCOUNTER — Telehealth: Payer: Self-pay | Admitting: Family Medicine

## 2020-08-04 NOTE — Telephone Encounter (Signed)
Pt called stating that she is having a really bad headache and is wanting to know if something can be called in for her. Please advise.

## 2020-08-07 ENCOUNTER — Telehealth: Payer: Self-pay | Admitting: Internal Medicine

## 2020-08-07 NOTE — Telephone Encounter (Signed)
Patient is scheduled for Prolia injection on 08/11/20 and Prolia will need to be provided

## 2020-08-07 NOTE — Telephone Encounter (Signed)
I called pt and LMVM for her that was following up on her phone call last Friday.  Hopefully she was feeling better.  She is to call us back if needed.

## 2020-08-08 ENCOUNTER — Telehealth: Payer: Self-pay | Admitting: Family Medicine

## 2020-08-08 MED ORDER — NARATRIPTAN HCL 2.5 MG PO TABS: 2.5000 mg | ORAL_TABLET | ORAL | 5 refills | Status: DC | PRN

## 2020-08-08 NOTE — Telephone Encounter (Signed)
Pt called stating that the Emgality is not working for her. She states she is still getting the headaches and would like to discuss possibly some medication options. Please advise.

## 2020-08-08 NOTE — Telephone Encounter (Signed)
I made appt for her with AL/NP 11-24-20 at 1430 next afternoon available.  Pt is able to call and see if any cancellations prior to that.

## 2020-08-08 NOTE — Telephone Encounter (Signed)
I called pt.  She stated that emaglity and sumatriptan not working. Last seen 10/021, she cancelled 05-16-20.   She wanted something different.  I tried to make appt with her tomorrow or the next day with AL and she could not make it.  No availability in afternoons in the next 1-2 weeks.  I offered next available 11/2020 with AL or YY 08-29-20 (did not want these).  [facility] per her request about naratriptan and placed new prescription.  She picked up 07-21-20 may be too soon , per Ebony Hail, pharmacy she may pay OOP to get early if able.  Pt stated will try to go to someone else as having issues with appts.

## 2020-08-10 NOTE — Telephone Encounter (Signed)
Noted  

## 2020-08-11 ENCOUNTER — Ambulatory Visit: Payer: Medicare Other

## 2020-08-16 ENCOUNTER — Other Ambulatory Visit: Payer: Self-pay | Admitting: Neurology

## 2020-08-16 ENCOUNTER — Ambulatory Visit: Payer: Medicare Other

## 2020-08-23 ENCOUNTER — Ambulatory Visit: Payer: Medicare Other

## 2020-08-27 ENCOUNTER — Other Ambulatory Visit: Payer: Self-pay | Admitting: Internal Medicine

## 2020-08-27 DIAGNOSIS — F339 Major depressive disorder, recurrent, unspecified: Secondary | ICD-10-CM

## 2020-08-29 ENCOUNTER — Ambulatory Visit (INDEPENDENT_AMBULATORY_CARE_PROVIDER_SITE_OTHER): Payer: Medicare Other

## 2020-08-29 ENCOUNTER — Other Ambulatory Visit: Payer: Self-pay

## 2020-08-29 DIAGNOSIS — M818 Other osteoporosis without current pathological fracture: Secondary | ICD-10-CM

## 2020-08-29 MED ORDER — DENOSUMAB 60 MG/ML ~~LOC~~ SOSY
60.0000 mg | PREFILLED_SYRINGE | Freq: Once | SUBCUTANEOUS | Status: AC
  Administered 2020-08-29: 60 mg via SUBCUTANEOUS

## 2020-08-29 NOTE — Progress Notes (Signed)
Prolia injection administered to pt's left arm. Pt tolerated well. °

## 2020-09-05 ENCOUNTER — Telehealth: Payer: Self-pay | Admitting: Family Medicine

## 2020-09-05 NOTE — Telephone Encounter (Signed)
Left a detailed message with the pharmacy advising the patient had contacted our office in regards to her Emgality. Advised the pharmacy a script should be on file for the patient until 12/2020 since there were 11 refills on the script sent in nov 2021. Advised the pharmacy if anything is needed from [place] they should call our office otherwise they should contact the patient to see about getting her refill to her.  If the pharmacy is out of stock of the medication, they are responsible of checking with the other [facility] around them to see if it can't be filled somewhere else for the patient.

## 2020-09-05 NOTE — Telephone Encounter (Signed)
Pt states [facility] for a refill on her  EMGALITY 120 MG/ML SOAJ and was told they were out.  Pt states that didn't seem right to her and she is asking RN calls the pharmacy for her on that request

## 2020-09-05 NOTE — Telephone Encounter (Signed)
Called the pharmacy. They were on lunch until 2 pm/

## 2020-09-07 ENCOUNTER — Other Ambulatory Visit: Payer: Self-pay | Admitting: Family Medicine

## 2020-09-09 ENCOUNTER — Other Ambulatory Visit: Payer: Self-pay | Admitting: Internal Medicine

## 2020-09-09 DIAGNOSIS — F339 Major depressive disorder, recurrent, unspecified: Secondary | ICD-10-CM

## 2020-09-28 ENCOUNTER — Other Ambulatory Visit: Payer: Self-pay | Admitting: Internal Medicine

## 2020-09-28 DIAGNOSIS — G894 Chronic pain syndrome: Secondary | ICD-10-CM

## 2020-09-28 DIAGNOSIS — F339 Major depressive disorder, recurrent, unspecified: Secondary | ICD-10-CM

## 2020-10-07 ENCOUNTER — Other Ambulatory Visit: Payer: Self-pay | Admitting: Internal Medicine

## 2020-10-29 ENCOUNTER — Other Ambulatory Visit: Payer: Self-pay | Admitting: Internal Medicine

## 2020-10-29 DIAGNOSIS — F339 Major depressive disorder, recurrent, unspecified: Secondary | ICD-10-CM

## 2020-11-19 ENCOUNTER — Other Ambulatory Visit: Payer: Self-pay | Admitting: Neurology

## 2020-11-27 ENCOUNTER — Encounter: Payer: Self-pay | Admitting: Family Medicine

## 2020-11-27 ENCOUNTER — Ambulatory Visit (INDEPENDENT_AMBULATORY_CARE_PROVIDER_SITE_OTHER): Payer: Medicare Other | Admitting: Family Medicine

## 2020-11-27 VITALS — BP 150/90 | HR 95 | Ht 60.0 in | Wt 138.0 lb

## 2020-11-27 DIAGNOSIS — G43909 Migraine, unspecified, not intractable, without status migrainosus: Secondary | ICD-10-CM | POA: Diagnosis not present

## 2020-11-27 MED ORDER — NARATRIPTAN HCL 2.5 MG PO TABS: 2.5000 mg | ORAL_TABLET | ORAL | 5 refills | Status: DC | PRN

## 2020-11-27 MED ORDER — EMGALITY 120 MG/ML ~~LOC~~ SOAJ: 120.0000 mg | SUBCUTANEOUS | 11 refills | Status: AC

## 2020-11-27 NOTE — Progress Notes (Signed)
PATIENT: Jill Salinas DOB: [birth year]  REASON FOR VISIT: follow up HISTORY FROM: patient  Chief Complaint  Patient presents with   Follow-up    Pt alone, NCV rm 3, pt states since she is taking the emgality about 3.5 wks and that has been helpful with reducing them.       HISTORY OF PRESENT ILLNESS: 11/27/20 ALL: She returns for follow up for migraines. Last visit 11/2019, advised 6 month follow up. She was advised to continue propranolol ER 60mg  daily and Emgality monthly. Eletriptan called in for abortive therapy but not covered so naratriptan was started. She reports to me that she has continued Emgality but has not taken propranolol in some time. She is not sure when she stopped this. She thinks it was making her feel badly but not sure of specific side effects. She reports that Dr Raliegh Ip with [facility] started a "similar medication" for chest pain and tightness but she is not sure what it was called. Maybe Protonix??? No medications listed on her medication list that match description. She does feel that naratriptan helps with abortive therapy. She is uncertain how many headaches she has had recently but states that the past two months "have been good."   11/16/2019 ALL:  Jill Salinas is a 75 y.o. female here today for follow up for miraines. She was last seen 05/2019. We reeducated her on previous plan to start propranolol (had not been started) and continued Emgality and sumatriptan. She called in September to report migraine unresponsive to sumatriptan. She had only taken propranolol for two days according to her phone report. She states that she is now taking it daily. She switched from daytime to nighttime due to concerns of dizziness. Dizziness has resolved since changing dose to night. She reports that this may have helped her headaches some. She took last Emgality injection on 9/29. She has had one migraine since starting propranolol.   She reports multiple concerns today  including biting lips, profuse sweating every day around 3-4am and "crazy" movements in abdomen. She also reports dizziness that has been present for 1.5 years. She has been seeing yellow bulbs in her eyes at night. She has glaucoma. She has an appt with ophthalmology this month. She has not seen PCP for these concerns. Unclear how long symptoms have been present.   HISTORY: (copied from my note on 06/10/2019)  Jill Salinas is a 75 y.o. female here today for follow up for chronic migraines. She was previously taking Topamax 200mg  at bedtime, Emgality and was started on propranolol LA 60mg  daily in 10/2018. Today, she reports that she is no longer taking topiramate and doesn't think she started propranolol. She is quite unclear about why. She thinks she threw the propranolol away because she wasn't sure what it was for. Per telephone note on 10/16, patient had called with concerns of continuing topiramate as it was a "seizure" medication. She was advised to wean topiramate and start nortriptyline. She states that nortriptyline " was a terrible drug". No clear side effects other than she did not like it. Per Dr Rhea Belton last follow up note, she was using Imitrex for abortive therapy but when questioned, she doesn't think she has been taking this. She reports using Aleve for migraine management. She is unable to quantify how much Aleve she is taking. She is followed regularly by pain management on on multiple medications for chronic pain including buprenorphine patch, oxycodone 10-325mg  five times daily. She is on  duloxetine and fluoxetine for mood and pain management.  She does not sleep well and takes Ambien every night. She is taking modafinil during the day for daytime sleepiness.    HISTORY: (copied from Dr Rhea Belton note on 11/09/2018)   She has migraines for many many years currently well controlled on 200 mg of Topamax at night. She also takes gabapentin 900mg  daily, which has helped her migraines as well. She  uses Imitrex acutely both the injectable and the oral preparation. She thinks her headaches are in good control. She used imitrex po first, if that does not take care of her headaches, she would use injections.   This an early visit for her complains of 2 months history of right side neck pain, right occipital area pain, it bothers her 2/week, lasting all day, she felt spacy dealing with the pain, she does not like to drive,   She also had trouble walking, do not have the strength to get up an even steps, xone year, she has no incontinence, more so on the left leg, no involvement of left arm, she also has left knee swelling, felt so bad. She had 30 Lb weight gain over one year "can not get into any of my cloth" She also complains of low back pain, radiating pain to her left leg, has tried physical therapy recently, which has been helpful, no incontinence.     UPDATE June 24th 2015:YY EMG/NCS in June 24th 2015 showed no evidence of left lumbar radiculopathy, there was evidence of right triceps irritation, but no neuropathic changes at other selected right upper extremity needle examination.   We have reviewed MRI lumbar film together, only mild degenerative disc disease, there was no significant foraminal, or canal stenosis    She is taking Cymbalta now, which has helped her some, but continued complaint bilateral lower extremity deep achy pain, especially at nighttime, she no longer has shooting pain from her neck to her shoulders, or arms, but she complains of deep achy pain in her midline upper cervical region,   She also complains fatigued easily, lack of stamina She had left lumbar epidural injection, by interventional radiologist Dr. Lawrence Santiago at left L4-5 space, in August 24th 2015, she only had transient improvement,   Now she complains of left lower extremity pain, bilateral lower extremity deep achy pain, feet paresthesia, gait difficulty,   UPDATE April 25th 2016:YY She complains of  two months history of neck pain, spreading forward to bilateral retrorbital area, daily, can go up to 10/10, radiating to her right shoulder, woke up at night with headaches,has been using frequent Imitrex tablet, sometimes injection   She has history of migraine all her life, previous headaches is lateralized severe pounding headache, now it is at her neck,  She tries to sleep different ways,Change pillows without helping   She is under pain management for her low back pain,is taking daily hydrocodone, tried Elavil before, complains of dizziness, weight gain.   Update July third 2017:YY She has severe constipation, has not used bathroom for 3 months,  She complains of bloating, tried different methods, medications, abdomen massage,  Her migraine has much improved, she has migraines headaches every few times, she is taking imitrex as needed, which has been helpful she is worried about side effect of Trokendi xr, I will decrease the dosage from 200mg  to 100mg  qhs.   UPDATE May 12 2017: Today she coming with different complaints, complains of frequent awakening at nighttime, drenching sweats, pain in her  legs, difficulty falling to sleep, chronic constipation,   Laboratory evaluations in October 2017 showed mild anemia hemoglobin of 12.5, normal CMP,   MRI of cervical and lumbar spine 2015 showed degenerative changes, but there was no significant canal or foraminal narrowing.   Update November 09, 2018: She complains of depression, poor appetite, diffuse body achy pain, frequent almost daily headaches, despite polypharmacy treatment, including Prozac 40 mg daily, Topamax 100 mg 2 tablets at bedtime, Imitrex as needed is helpful most of the time   REVIEW OF SYSTEMS: Out of a complete 14 system review of symptoms, the patient complains only of the following symptoms, chronic back pain, headaches, anxiety and all other reviewed systems are negative.  ALLERGIES: Allergies  Allergen Reactions    Depakote [Divalproex Sodium] Nausea And Vomiting   Gabapentin Other (See Comments)    Made her "feel crazy"   Iodinated Diagnostic Agents Itching    Throat itching, pt received a 13 hour prep for injection   Codeine    Nortriptyline Other (See Comments)    constipation    HOME MEDICATIONS: Outpatient Medications Prior to Visit  Medication Sig Dispense Refill   Buprenorphine 15 MCG/HR PTWK as needed.     cyanocobalamin (,VITAMIN B-12,) 1000 MCG/ML injection ADMINISTER 1 ML (1,000 MCG) INTRAMUSCULARLY MONTHLY 10 ML BOTTLE 10 mL 1   denosumab (PROLIA) 60 MG/ML SOLN injection Inject 60 mg into the skin every 6 (six) months. Administer in upper arm, thigh, or abdomen     diclofenac sodium (VOLTAREN) 1 % GEL Apply 2 g topically as needed.     DULoxetine (CYMBALTA) 60 MG capsule Take 60 mg by mouth daily.     folic acid (FOLVITE) 1 MG tablet TAKE 1 TABLET BY MOUTH EVERY DAY 90 tablet 0   LINZESS 290 MCG CAPS capsule TAKE 1 CAPSULE BY MOUTH EVERY DAY BEFORE BREAKFAST 30 capsule 2   LUMIGAN 0.01 % SOLN Place 1 drop into both eyes at bedtime.     Misc Natural Products (LEG VEIN & CIRCULATION PO) Take by mouth.     modafinil (PROVIGIL) 200 MG tablet Take 200 mg by mouth daily as needed.   1   Multiple Vitamins-Minerals (ONE-A-DAY VITACRAVES IMMUNITY PO) Take by mouth.     ondansetron (ZOFRAN) 4 MG tablet TAKE 1 TABLET BY MOUTH EVERY 8 HOURS AS NEEDED 20 tablet 0   oxyCODONE-acetaminophen (PERCOCET) 10-325 MG tablet Filled by pain clinic  0   polyethylene glycol (MIRALAX / GLYCOLAX) packet Take 17 g by mouth daily.     predniSONE (DELTASONE) 50 MG tablet Pt to take 50 mg of prednisone on 06/19/20 at 1:40 AM, 50 mg of prednisone on 06/19/20 at 7:40 AM, and 50 mg of prednisone on 06/19/20 at 1:40 PM. Pt is also to take 50 mg of benadryl on 06/19/20 at 1:40 PM. Please call [phone] with any questions. 3 tablet 0   propranolol ER (INDERAL LA) 60 MG 24 hr capsule Take 1 capsule (60 mg total) by mouth daily.  90 capsule 3   tretinoin (RETIN-A) 0.025 % cream APPLY TOPICALLY AT BEDTIME. 45 g 1   vitamin B-12 (CYANOCOBALAMIN) 1000 MCG tablet Take 1,000 mcg by mouth daily.     Vitamin D, Ergocalciferol, (DRISDOL) 1.25 MG (50000 UNIT) CAPS capsule TAKE 1 CAPSULE BY MOUTH EVERY 7 DAYS 12 capsule 4   zolpidem (AMBIEN) 5 MG tablet Take 5 mg by mouth at bedtime as needed for sleep.     EMGALITY 120 MG/ML SOAJ INJECT 120  MG INTO THE SKIN EVERY 30 (THIRTY) DAYS. 1 mL 11   naratriptan (AMERGE) 2.5 MG tablet Take 1 tablet (2.5 mg total) by mouth as needed for migraine. Take one (1) tablet at onset of headache; if returns or does not resolve, may repeat after 4 hours; do not exceed five (5) mg in 24 hours. 10 tablet 5   diphenhydrAMINE (BENADRYL) 50 MG tablet Take 1 tablet (50 mg total) by mouth once for 1 dose. Pt to take 50 mg of Benadryl on 06/19/20 @ 1:40 PM. Please call [phone] with any questions. 30 tablet 0   FLUoxetine (PROZAC) 40 MG capsule TAKE 1 CAPSULE BY MOUTH EVERY DAY 90 capsule 0   thiamine 100 MG tablet TAKE 1 TABLET BY MOUTH EVERY DAY 30 tablet 11   triamcinolone (KENALOG) 0.1 % APPLY TO CHIN DAILY AT BEDTIME AS NEEDED 30 g 0   No facility-administered medications prior to visit.    PAST MEDICAL HISTORY: Past Medical History:  Diagnosis Date   Bowel obstruction ([place])    Breast cancer ([place])    Headache(784.0)    Migraine    Vision abnormalities    glaucoma    PAST SURGICAL HISTORY: Past Surgical History:  Procedure Laterality Date   BREAST LUMPECTOMY     TOTAL ABDOMINAL HYSTERECTOMY N/A     FAMILY HISTORY: Family History  Problem Relation Age of Onset   Heart disease Mother    Headache Mother    Heart attack Father     SOCIAL HISTORY: Social History   Socioeconomic History   Marital status: Married    Spouse name: Gwyndolyn Saxon    Number of children: 0   Years of education: Assoc   Highest education level: Not on file  Occupational History   Occupation: retired  Tobacco  Use   Smoking status: Never   Smokeless tobacco: Never  Vaping Use   Vaping Use: Never used  Substance and Sexual Activity   Alcohol use: Yes    Alcohol/week: 2.0 standard drinks    Types: 2 Standard drinks or equivalent per week    Comment: drinks on the weekend   Drug use: No   Sexual activity: Not on file  Other Topics Concern   Not on file  Social History Narrative   Patient lives at home with her husband. Gwyndolyn Saxon    Patient has an Geophysicist/field seismologist.    Patient has no children.    Patient is right handed.    Social Determinants of Health   Financial Resource Strain: Not on file  Food Insecurity: Not on file  Transportation Needs: Not on file  Physical Activity: Not on file  Stress: Not on file  Social Connections: Not on file  Intimate Partner Violence: Not on file      PHYSICAL EXAM  Vitals:   11/27/20 1352  BP: (!) 150/90  Pulse: 95  Weight: 138 lb (62.6 kg)  Height: 5' (1.524 m)    Body mass index is 26.95 kg/m.  Generalized: Well developed, in no acute distress  Cardiology: normal rate and rhythm, no murmur noted Respiratory: clear to auscultation bilaterally  Neurological examination  Mentation: Alert oriented to time, place, history taking. Follows all commands speech and language fluent Cranial nerve II-XII: Pupils were equal round reactive to light. Extraocular movements were full, visual field were full on confrontational test. Facial sensation and strength were normal. Head turning and shoulder shrug  were normal and symmetric. Motor: The motor testing reveals 5 over 5 strength of  all 4 extremities. Good symmetric motor tone is noted throughout.  Sensory: Sensory testing is intact to soft touch on all 4 extremities. No evidence of extinction is noted.  Coordination: Cerebellar testing reveals good finger-nose-finger and heel-to-shin bilaterally.  Gait and station: Gait is normal.    DIAGNOSTIC DATA (LABS, IMAGING, TESTING) - I reviewed patient  records, labs, notes, testing and imaging myself where available.  No flowsheet data found.   Lab Results  Component Value Date   WBC 7.6 11/25/2019   HGB 12.2 11/25/2019   HCT 37.5 11/25/2019   MCV 101.6 (H) 11/25/2019   PLT 270 11/25/2019      Component Value Date/Time   NA 143 11/25/2019 1456   K 5.6 (H) 11/25/2019 1456   CL 106 11/25/2019 1456   CO2 29 11/25/2019 1456   GLUCOSE 72 11/25/2019 1456   BUN 12 11/25/2019 1456   CREATININE 0.93 11/25/2019 1456   CALCIUM 9.8 11/25/2019 1456   PROT 7.1 11/25/2019 1456   ALBUMIN 4.4 09/16/2018 1458   AST 14 11/25/2019 1456   ALT 10 11/25/2019 1456   ALKPHOS 76 09/16/2018 1458   BILITOT 0.3 11/25/2019 1456   GFRNONAA >60 11/21/2015 1104   GFRAA >60 11/21/2015 1104   Lab Results  Component Value Date   CHOL 225 (H) 11/25/2019   HDL 55 11/25/2019   LDLCALC 133 (H) 11/25/2019   TRIG 231 (H) 11/25/2019   CHOLHDL 4.1 11/25/2019   Lab Results  Component Value Date   HGBA1C 5.2 06/22/2019   Lab Results  Component Value Date   VITAMINB12 610 11/25/2019   Lab Results  Component Value Date   TSH 3.49 09/16/2018       ASSESSMENT AND PLAN 75 y.o. year old female  has a past medical history of Bowel obstruction ([place]), Breast cancer ([place]), Headache(784.0), Migraine, and Vision abnormalities. here with     ICD-10-CM   1. Migraine without status migrainosus, not intractable, unspecified migraine type  G43.909        Tonishia is doing well, today. Headaches have improved since adding propranolol 60mg  daily about 2-3 weeks ago. She was advised to continue Emgality injections every 30 days. We will continue naratriptan 2.5mg  as needed for abortive therapy. She was advised to follow up closely with PCP. She will bring me or call with updated medication list. We will not restart propranolol at this time as she feels headaches are well managed and she did not like the way she felt on propranolol.  Healthy lifestyle habits encouraged.  She wil follow up in 1 year, sooner if needed. She verbalizes understanding and agreement with this plan.    No orders of the defined types were placed in this encounter.    Meds ordered this encounter  Medications   Galcanezumab-gnlm (EMGALITY) 120 MG/ML SOAJ    Sig: Inject 120 mg into the skin every 30 (thirty) days.    Dispense:  1 mL    Refill:  11    Order Specific Question:   Supervising Provider    Answer:   Melvenia Beam [[id number]]   naratriptan (AMERGE) 2.5 MG tablet    Sig: Take 1 tablet (2.5 mg total) by mouth as needed for migraine. Take one (1) tablet at onset of headache; if returns or does not resolve, may repeat after 4 hours; do not exceed five (5) mg in 24 hours.    Dispense:  10 tablet    Refill:  5    Order Specific Question:  Supervising Provider    Answer:   Melvenia Beam [[id number]]       Debbora Presto, FNP-C 11/27/2020, 2:34 PM [facility] [address] [place], [place] [address] [phone]

## 2020-11-27 NOTE — Patient Instructions (Addendum)
Below is our plan:  We will continue Emgality injections every 30 days. Continue naratriptan to stop a bad headache. We will not restart propranolol at this time as it seemed to make you feel badly and migraines seem to be well managed.   Please call me when you get home with a current medication list.   Please make sure you are staying well hydrated. I recommend 50-60 ounces daily. Well balanced diet and regular exercise encouraged. Consistent sleep schedule with 6-8 hours recommended.   Please continue follow up with care team as directed.   Follow up with me in 1 year  You may receive a survey regarding today's visit. I encourage you to leave honest feed back as I do use this information to improve patient care. Thank you for seeing me today!

## 2020-12-15 LAB — COLOGUARD

## 2020-12-19 ENCOUNTER — Other Ambulatory Visit: Payer: Self-pay | Admitting: Adult Medicine

## 2020-12-19 DIAGNOSIS — N63 Unspecified lump in unspecified breast: Secondary | ICD-10-CM

## 2020-12-19 DIAGNOSIS — Z853 Personal history of malignant neoplasm of breast: Secondary | ICD-10-CM

## 2020-12-20 LAB — HM MAMMOGRAPHY

## 2020-12-26 ENCOUNTER — Other Ambulatory Visit: Payer: Self-pay | Admitting: Internal Medicine

## 2020-12-26 DIAGNOSIS — G894 Chronic pain syndrome: Secondary | ICD-10-CM

## 2021-01-18 ENCOUNTER — Encounter: Payer: Self-pay | Admitting: Internal Medicine

## 2021-02-16 ENCOUNTER — Other Ambulatory Visit: Payer: Self-pay | Admitting: Internal Medicine

## 2021-02-16 DIAGNOSIS — F339 Major depressive disorder, recurrent, unspecified: Secondary | ICD-10-CM

## 2021-02-22 NOTE — Telephone Encounter (Signed)
2023 VOB initiated for Prolia °

## 2021-03-04 ENCOUNTER — Other Ambulatory Visit: Payer: Self-pay

## 2021-03-04 ENCOUNTER — Encounter (HOSPITAL_COMMUNITY): Payer: Self-pay

## 2021-03-04 ENCOUNTER — Emergency Department (HOSPITAL_COMMUNITY): Payer: Medicare Other

## 2021-03-04 ENCOUNTER — Emergency Department (HOSPITAL_COMMUNITY)
Admission: EM | Admit: 2021-03-04 | Discharge: 2021-03-04 | Disposition: A | Discharge status: Home / Self Care | Payer: Medicare Other | Attending: Emergency Medicine | Admitting: Emergency Medicine

## 2021-03-04 DIAGNOSIS — K5903 Drug induced constipation: Secondary | ICD-10-CM | POA: Diagnosis not present

## 2021-03-04 DIAGNOSIS — R109 Unspecified abdominal pain: Secondary | ICD-10-CM | POA: Diagnosis present

## 2021-03-04 LAB — URINALYSIS, ROUTINE W REFLEX MICROSCOPIC
Bilirubin Urine: NEGATIVE
Glucose, UA: NEGATIVE mg/dL
Hgb urine dipstick: NEGATIVE
Ketones, ur: NEGATIVE mg/dL
Leukocytes,Ua: NEGATIVE
Nitrite: NEGATIVE
Protein, ur: NEGATIVE mg/dL
Specific Gravity, Urine: 1.011 (ref 1.005–1.030)
pH: 6 (ref 5.0–8.0)

## 2021-03-04 LAB — CBC WITH DIFFERENTIAL/PLATELET
Abs Immature Granulocytes: 0.02 10*3/uL (ref 0.00–0.07)
Basophils Absolute: 0.1 10*3/uL (ref 0.0–0.1)
Basophils Relative: 1 %
Eosinophils Absolute: 0.1 10*3/uL (ref 0.0–0.5)
Eosinophils Relative: 1 %
HCT: 37.9 % (ref 36.0–46.0)
Hemoglobin: 12.3 g/dL (ref 12.0–15.0)
Immature Granulocytes: 0 %
Lymphocytes Relative: 22 %
Lymphs Abs: 1.5 10*3/uL (ref 0.7–4.0)
MCH: 33.7 pg (ref 26.0–34.0)
MCHC: 32.5 g/dL (ref 30.0–36.0)
MCV: 103.8 fL — ABNORMAL HIGH (ref 80.0–100.0)
Monocytes Absolute: 0.5 10*3/uL (ref 0.1–1.0)
Monocytes Relative: 8 %
Neutro Abs: 4.8 10*3/uL (ref 1.7–7.7)
Neutrophils Relative %: 68 %
Platelets: 300 10*3/uL (ref 150–400)
RBC: 3.65 MIL/uL — ABNORMAL LOW (ref 3.87–5.11)
RDW: 12.8 % (ref 11.5–15.5)
WBC: 7 10*3/uL (ref 4.0–10.5)
nRBC: 0 % (ref 0.0–0.2)

## 2021-03-04 LAB — COMPREHENSIVE METABOLIC PANEL
ALT: 23 U/L (ref 0–44)
AST: 25 U/L (ref 15–41)
Albumin: 3.9 g/dL (ref 3.5–5.0)
Alkaline Phosphatase: 64 U/L (ref 38–126)
Anion gap: 9 (ref 5–15)
BUN: 12 mg/dL (ref 8–23)
CO2: 26 mmol/L (ref 22–32)
Calcium: 10 mg/dL (ref 8.9–10.3)
Chloride: 101 mmol/L (ref 98–111)
Creatinine, Ser: 0.91 mg/dL (ref 0.44–1.00)
GFR, Estimated: >60 mL/min (ref >60)
Glucose, Bld: 98 mg/dL (ref 70–99)
Potassium: 4.3 mmol/L (ref 3.5–5.1)
Sodium: 136 mmol/L (ref 135–145)
Total Bilirubin: 0.5 mg/dL (ref 0.3–1.2)
Total Protein: 7 g/dL (ref 6.5–8.1)

## 2021-03-04 LAB — LIPASE, BLOOD: Lipase: 28 U/L (ref 11–51)

## 2021-03-04 IMAGING — CT CT ABD-PELV W/O CM
2 of 4 series · 15 of 46 positions shown, 17 images · non-contrast
Comparison: [DATE]

CLINICAL DATA: Abdominal pain. Constipation. Question bowel
obstruction.



[Series 3: abd/ pelvis 5.0 i30f 2 · axial · 0.84mm/px · z∈[+1120,+1500]mm · 12 of 84 slices shown, 14 images]
[im 4/84  soft-tissue]
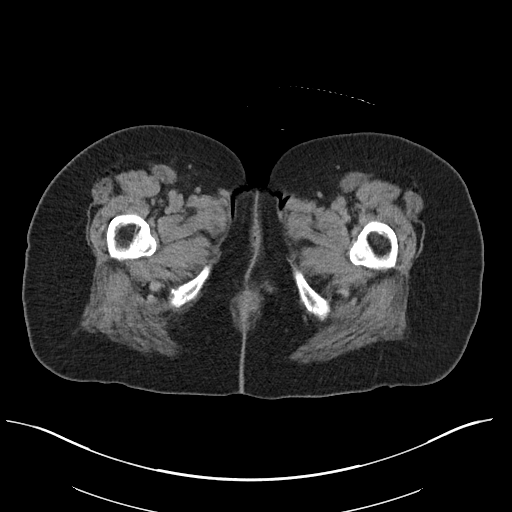
[im 4/84  bone]
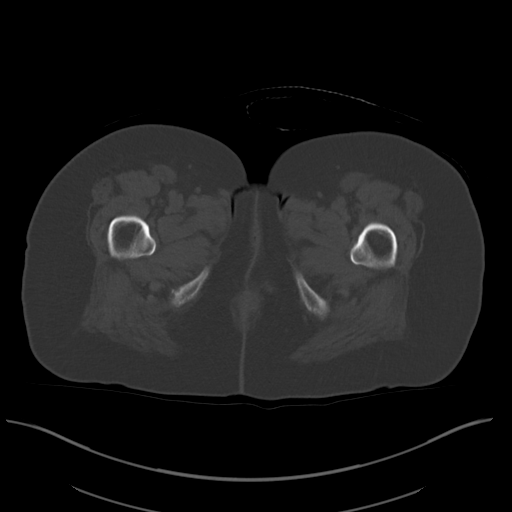
[im 11/84  soft-tissue]
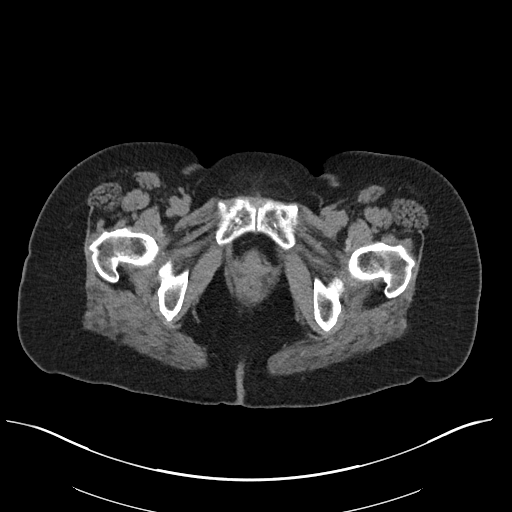
[im 18/84  soft-tissue]
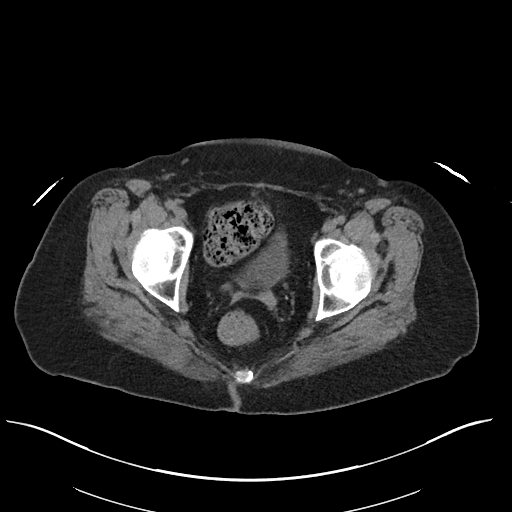
[im 25/84  soft-tissue]
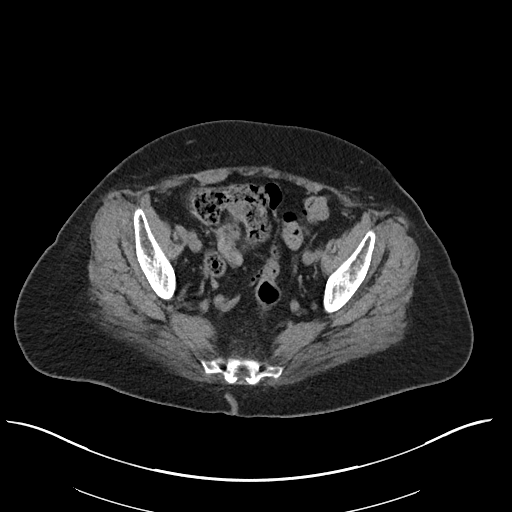
[im 32/84  soft-tissue]
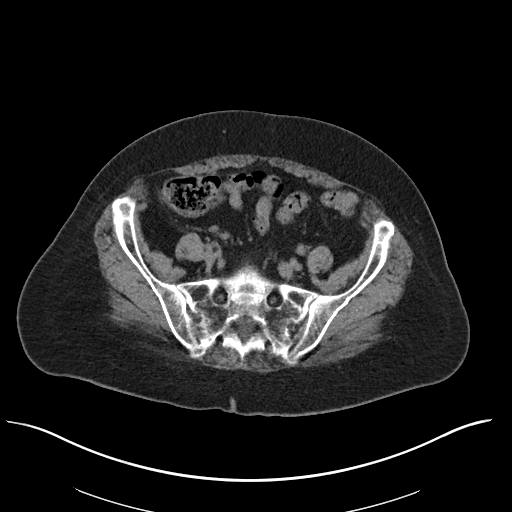
[im 39/84  soft-tissue]
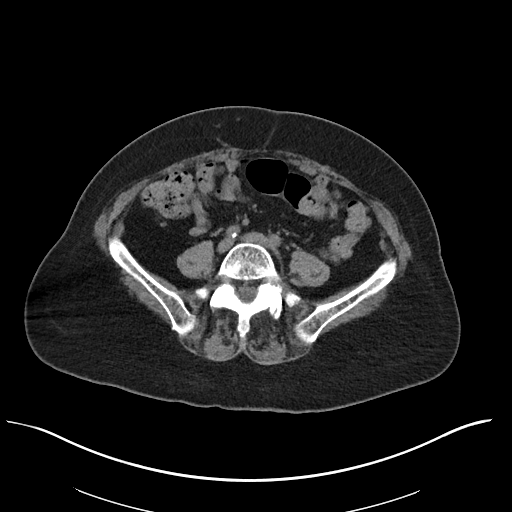
[im 45/84  soft-tissue]
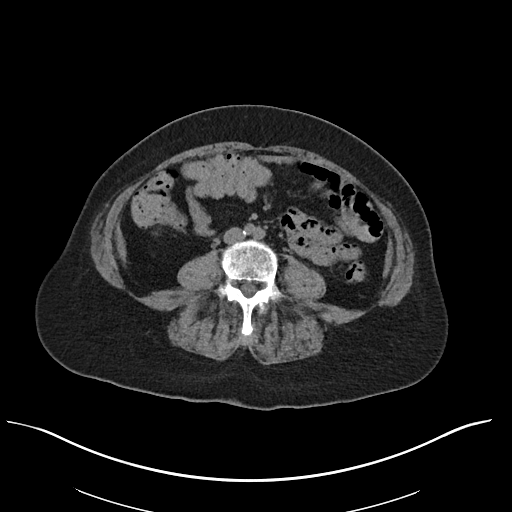
[im 52/84  soft-tissue]
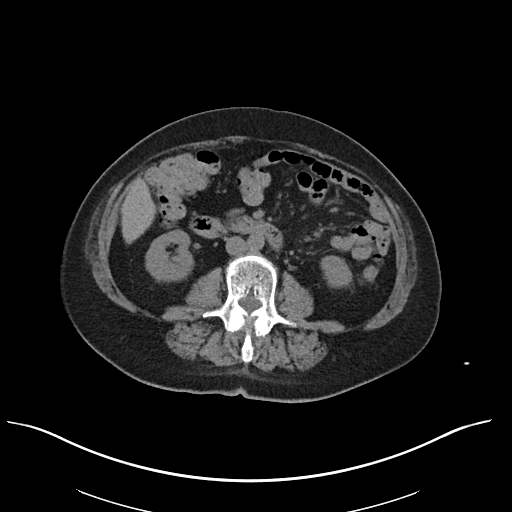
[im 59/84  soft-tissue]
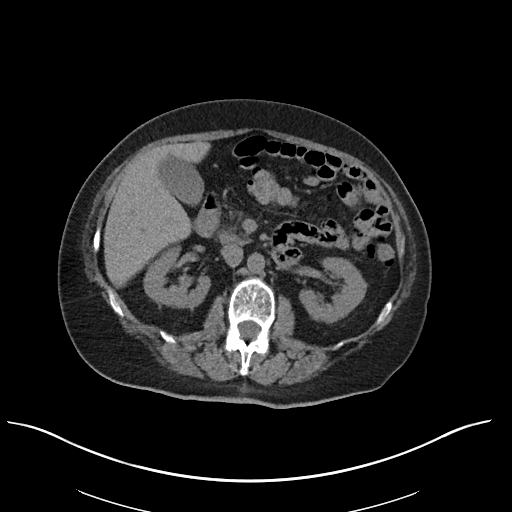
[im 59/84  bone]
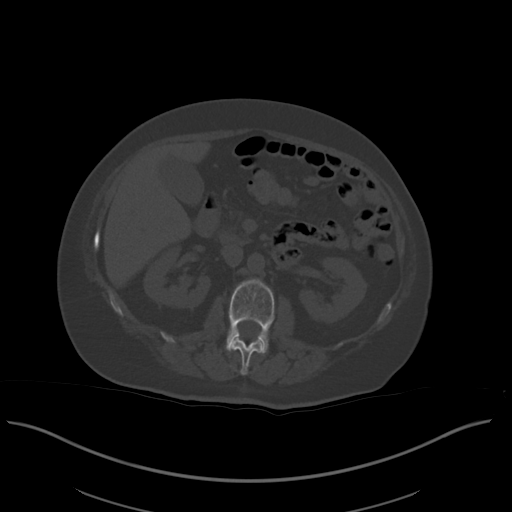
[im 66/84  soft-tissue]
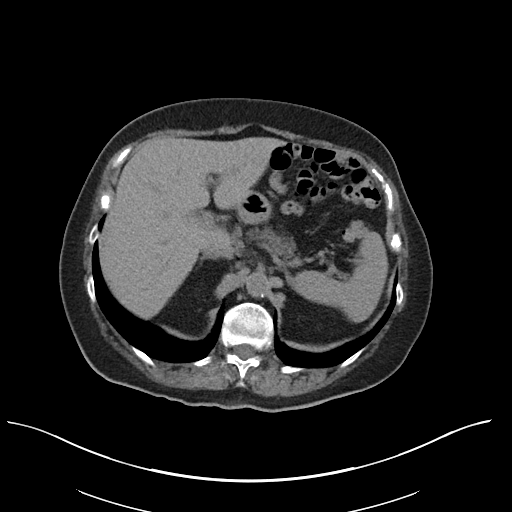
[im 73/84  soft-tissue]
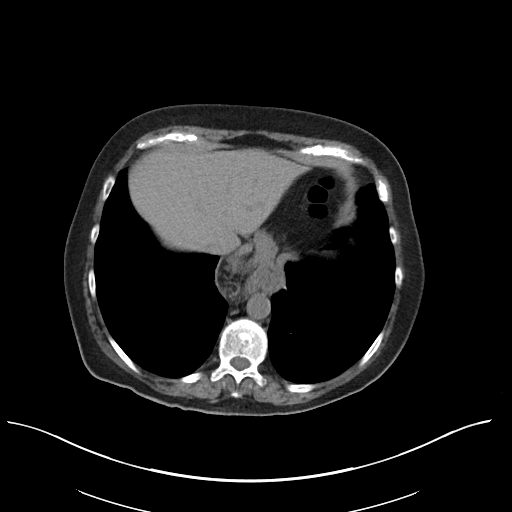
[im 80/84  soft-tissue]
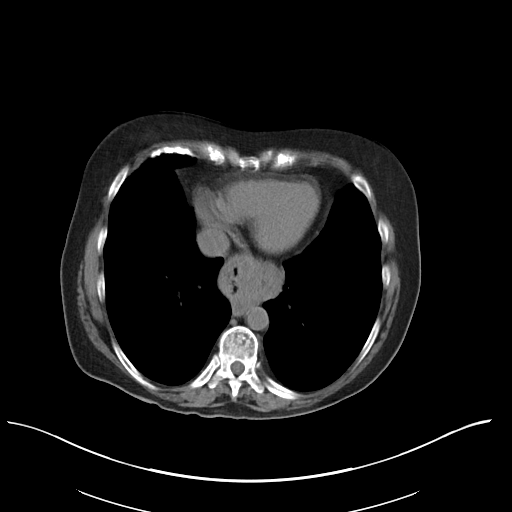

[Series 6: cor st · coronal · 0.71mm/px · 3 of 101 slices shown]
[im 34/101  soft-tissue]
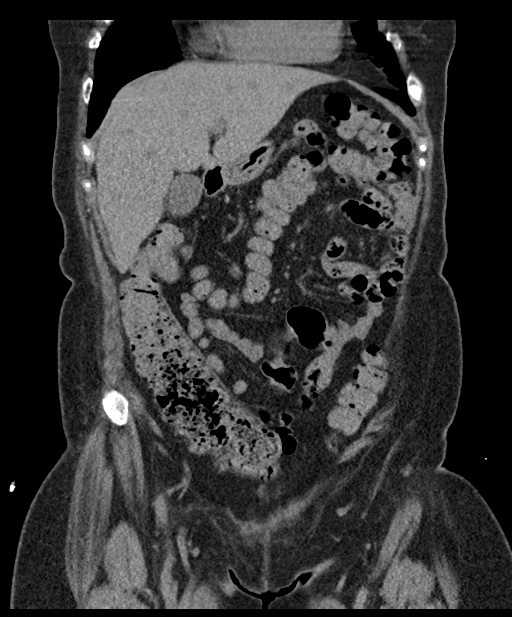
[im 45/101  soft-tissue]
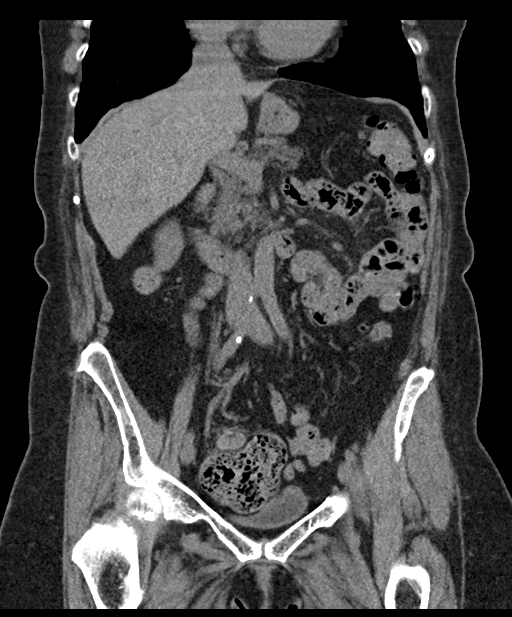
[im 56/101  soft-tissue]
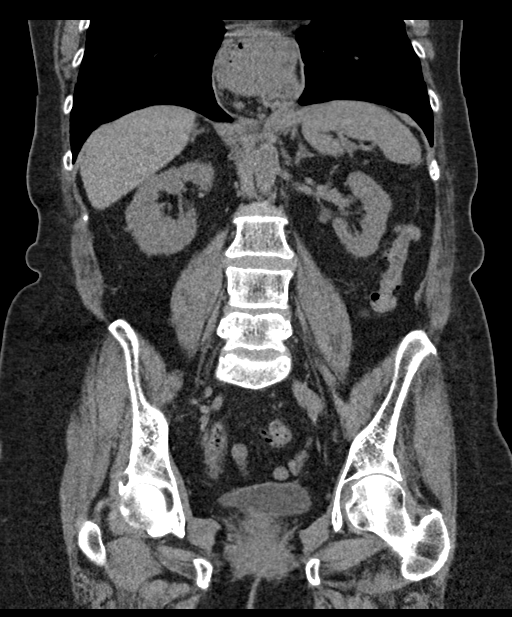

[15 of 46 positions shown; findings below may reference images not displayed]

FINDINGS: Lower chest: Moderate to large hiatal hernia.

Hepatobiliary: No focal abnormality in the liver on this study
without intravenous contrast. There is no evidence for gallstones,
gallbladder wall thickening, or pericholecystic fluid. No
intrahepatic or extrahepatic biliary dilation.

Pancreas: No focal mass lesion. No dilatation of the main duct. No
intraparenchymal cyst. No peripancreatic edema.

Spleen: No splenomegaly. No focal mass lesion.

Adrenals/Urinary Tract: No adrenal nodule or mass. Kidneys
unremarkable. No evidence for hydroureter. The urinary bladder
appears normal for the degree of distention.

Stomach/Bowel: Moderate to large hiatal hernia with 50-75% of the
stomach contained in the chest. Duodenum is normally positioned as
is the ligament of Treitz. Duodenal diverticulum noted. No small
bowel wall thickening. No small bowel dilatation. The terminal ileum
is normal. The appendix is not well visualized, but there is no
edema or inflammation in the region of the cecum. Moderate stool
volume noted right colon with transverse and left colon largely free
of stool. A few scattered left colonic diverticuli evident without
diverticulitis.

Vascular/Lymphatic: There is mild atherosclerotic calcification of
the abdominal aorta without aneurysm. There is no gastrohepatic or
hepatoduodenal ligament lymphadenopathy. No retroperitoneal or
mesenteric lymphadenopathy. No pelvic sidewall lymphadenopathy.

Reproductive: Uterus surgically absent.  There is no adnexal mass.

Other: No intraperitoneal free fluid.

Musculoskeletal: No worrisome lytic or sclerotic osseous
abnormality.
IMPRESSION: 1. No acute findings in the abdomen or pelvis. Specifically, no
evidence for bowel obstruction.
2. Moderate to large hiatal hernia with 50-75% of the stomach
contained in the chest. This is not substantially changed since
prior study.
3. Mild left colonic diverticulosis without diverticulitis.
4. Aortic Atherosclerosis ([BQ]-[BQ]).

## 2021-03-04 MED ORDER — POLYETHYLENE GLYCOL 3350 17 G PO PACK
17.0000 g | PACK | Freq: Every day | ORAL | Status: DC
  Administered 2021-03-04: 17 g via ORAL
  Filled 2021-03-04: qty 1

## 2021-03-04 MED ORDER — SORBITOL 70 % SOLN
960.0000 mL | TOPICAL_OIL | Freq: Once | ORAL | Status: DC
  Filled 2021-03-04 (×3): qty 473

## 2021-03-04 NOTE — ED Provider Triage Note (Signed)
Emergency Medicine Provider Triage Evaluation Note  Jill Salinas , a 76 y.o. female  was evaluated in triage.  Pt complains of abdominal pain and constipation.  She states she has not had a mild bowel movement in multiple.  She takes Linzess and narcotic pain meds.  She denies any fevers.  She states that she had to manually disimpact herself for any stool to come out.  Review of Systems  Positive:  Negative: Abdominal pain  Physical Exam  BP (!) 146/74 (BP Location: Left Arm)    Pulse 87    Temp 98.9 F (37.2 C) (Oral)    Resp 20    Ht 5' (1.524 m)    Wt 62.6 kg    SpO2 100%    BMI 26.95 kg/m  Gen:   Awake, no distress   Resp:  Normal effort  MSK:   Moves extremities without difficulty  Other:  Normal speech  Medical Decision Making  Medically screening exam initiated at 12:02 PM.  Appropriate orders placed.  Jill Salinas was informed that the remainder of the evaluation will be completed by another provider, this initial triage assessment does not replace that evaluation, and the importance of remaining in the ED until their evaluation is complete.  Patient has allergy to iodinated contrast material with throat itching. Will initiate her work-up with a noncontrasted CT scan, and labs.    Lorin Glass, [place] 03/04/21 1206

## 2021-03-04 NOTE — ED Provider Notes (Signed)
[facility] EMERGENCY DEPARTMENT Provider Note   CSN: [id number] Arrival date & time: 03/04/21  1131     History  Chief Complaint  Patient presents with   Abdominal Pain    Jill Salinas is a 76 y.o. female.  HPI She is presenting for evaluation of ongoing problems with constipation due to chronic narcotic use.  She also takes Linzess to help with that.  She feels distended and has had some nausea and "trying to vomit to help the obstruction."  She also tried to manually disimpact herself yesterday.  She denies fever, chills, cough or dizziness.    Home Medications Prior to Admission medications   Medication Sig Start Date End Date Taking? Authorizing Provider  Buprenorphine 15 MCG/HR PTWK as needed. 10/01/18   [provider]  cyanocobalamin (,VITAMIN B-12,) 1000 MCG/ML injection ADMINISTER 1 ML (1,000 MCG) INTRAMUSCULARLY MONTHLY 10 ML BOTTLE 10/10/20   Isaac Bliss, Rayford Halsted, MD  denosumab (PROLIA) 60 MG/ML SOLN injection Inject 60 mg into the skin every 6 (six) months. Administer in upper arm, thigh, or abdomen    [provider]  diclofenac sodium (VOLTAREN) 1 % GEL Apply 2 g topically as needed.    [provider]  diphenhydrAMINE (BENADRYL) 50 MG tablet Take 1 tablet (50 mg total) by mouth once for 1 dose. Pt to take 50 mg of Benadryl on 06/19/20 @ 1:40 PM. Please call [phone] with any questions. 06/08/20 06/08/20  Logan Bores, MD  DULoxetine (CYMBALTA) 60 MG capsule Take 60 mg by mouth daily. 10/06/20   [provider]  folic acid (FOLVITE) 1 MG tablet TAKE 1 TABLET BY MOUTH EVERY DAY 02/16/21   Isaac Bliss, Rayford Halsted, MD  Galcanezumab-gnlm [facility]) 120 MG/ML SOAJ Inject 120 mg into the skin every 30 (thirty) days. 11/27/20   Lomax, Amy, NP  LINZESS 290 MCG CAPS capsule TAKE 1 CAPSULE BY MOUTH EVERY DAY BEFORE BREAKFAST 12/26/20   Isaac Bliss, Rayford Halsted, MD  LUMIGAN 0.01 % SOLN Place 1 drop into both eyes at  bedtime. 11/13/15   [provider]  Misc Natural Products (LEG VEIN & CIRCULATION PO) Take by mouth.    [provider]  modafinil (PROVIGIL) 200 MG tablet Take 200 mg by mouth daily as needed.  06/17/16   [provider]  Multiple Vitamins-Minerals (ONE-A-DAY VITACRAVES IMMUNITY PO) Take by mouth.    [provider]  naratriptan (AMERGE) 2.5 MG tablet Take 1 tablet (2.5 mg total) by mouth as needed for migraine. Take one (1) tablet at onset of headache; if returns or does not resolve, may repeat after 4 hours; do not exceed five (5) mg in 24 hours. 11/27/20   Lomax, Amy, NP  ondansetron (ZOFRAN) 4 MG tablet TAKE 1 TABLET BY MOUTH EVERY 8 HOURS AS NEEDED 11/20/20   Lomax, Amy, NP  oxyCODONE-acetaminophen (PERCOCET) 10-325 MG tablet Filled by pain clinic 06/17/16   [provider]  polyethylene glycol (MIRALAX / GLYCOLAX) packet Take 17 g by mouth daily.    [provider]  predniSONE (DELTASONE) 50 MG tablet Pt to take 50 mg of prednisone on 06/19/20 at 1:40 AM, 50 mg of prednisone on 06/19/20 at 7:40 AM, and 50 mg of prednisone on 06/19/20 at 1:40 PM. Pt is also to take 50 mg of benadryl on 06/19/20 at 1:40 PM. Please call [phone] with any questions. 06/08/20   Logan Bores, MD  propranolol ER (INDERAL LA) 60 MG 24 hr capsule Take 1 capsule (  60 mg total) by mouth daily. 06/10/19   Lomax, Amy, NP  tretinoin (RETIN-A) 0.025 % cream APPLY TOPICALLY AT BEDTIME. 02/29/20   Isaac Bliss, Rayford Halsted, MD  vitamin B-12 (CYANOCOBALAMIN) 1000 MCG tablet Take 1,000 mcg by mouth daily.    [provider]  Vitamin D, Ergocalciferol, (DRISDOL) 1.25 MG (50000 UNIT) CAPS capsule TAKE 1 CAPSULE BY MOUTH EVERY 7 DAYS 07/18/20   Philemon Kingdom, MD  zolpidem (AMBIEN) 5 MG tablet Take 5 mg by mouth at bedtime as needed for sleep.    [provider]      Allergies    Depakote [divalproex sodium], Gabapentin, Iodinated contrast media, Codeine, and  Nortriptyline    Review of Systems   Review of Systems  Physical Exam Updated Vital Signs BP 139/65    Pulse 81    Temp 98.9 F (37.2 C) (Oral)    Resp 13    Ht 5' (1.524 m)    Wt 62.6 kg    SpO2 94%    BMI 26.95 kg/m  Physical Exam Vitals and nursing note reviewed.  Constitutional:      General: She is not in acute distress.    Appearance: She is well-developed. She is not ill-appearing, toxic-appearing or diaphoretic.  HENT:     Head: Normocephalic and atraumatic.     Right Ear: External ear normal.     Left Ear: External ear normal.  Eyes:     Conjunctiva/sclera: Conjunctivae normal.     Pupils: Pupils are equal, round, and reactive to light.  Neck:     Trachea: Phonation normal.  Cardiovascular:     Rate and Rhythm: Normal rate and regular rhythm.     Heart sounds: Normal heart sounds. No murmur heard. Pulmonary:     Effort: Pulmonary effort is normal. No respiratory distress.     Breath sounds: Normal breath sounds. No stridor.  Abdominal:     General: There is no distension.     Palpations: Abdomen is soft.     Tenderness: There is no abdominal tenderness.  Genitourinary:    Comments: Normal anus and rectum.  Small amount of stool in the rectum which is brown in color.  No fecal impaction in the rectum. Musculoskeletal:        General: Normal range of motion.     Cervical back: Normal range of motion and neck supple.  Skin:    General: Skin is warm and dry.  Neurological:     Mental Status: She is alert and oriented to person, place, and time.     Cranial Nerves: No cranial nerve deficit.     Sensory: No sensory deficit.     Motor: No abnormal muscle tone.     Coordination: Coordination normal.  Psychiatric:        Mood and Affect: Mood normal.        Behavior: Behavior normal.    ED Results / Procedures / Treatments   Labs (all labs ordered are listed, but only abnormal results are displayed) Labs Reviewed  CBC WITH DIFFERENTIAL/PLATELET - Abnormal;  Notable for the following components:      Result Value   RBC 3.65 (*)    MCV 103.8 (*)    All other components within normal limits  COMPREHENSIVE METABOLIC PANEL  LIPASE, BLOOD  URINALYSIS, ROUTINE W REFLEX MICROSCOPIC    EKG None  Radiology CT ABDOMEN PELVIS WO CONTRAST  Result Date: 03/04/2021 CLINICAL DATA:  Abdominal pain. Constipation. Question bowel obstruction. EXAM: CT  ABDOMEN AND PELVIS WITHOUT CONTRAST TECHNIQUE: Multidetector CT imaging of the abdomen and pelvis was performed following the standard protocol without IV contrast. RADIATION DOSE REDUCTION: This exam was performed according to the departmental dose-optimization program which includes automated exposure control, adjustment of the mA and/or kV according to patient size and/or use of iterative reconstruction technique. COMPARISON:  06/19/2020 FINDINGS: Lower chest: Moderate to large hiatal hernia. Hepatobiliary: No focal abnormality in the liver on this study without intravenous contrast. There is no evidence for gallstones, gallbladder wall thickening, or pericholecystic fluid. No intrahepatic or extrahepatic biliary dilation. Pancreas: No focal mass lesion. No dilatation of the main duct. No intraparenchymal cyst. No peripancreatic edema. Spleen: No splenomegaly. No focal mass lesion. Adrenals/Urinary Tract: No adrenal nodule or mass. Kidneys unremarkable. No evidence for hydroureter. The urinary bladder appears normal for the degree of distention. Stomach/Bowel: Moderate to large hiatal hernia with 50-75% of the stomach contained in the chest. Duodenum is normally positioned as is the ligament of Treitz. Duodenal diverticulum noted. No small bowel wall thickening. No small bowel dilatation. The terminal ileum is normal. The appendix is not well visualized, but there is no edema or inflammation in the region of the cecum. Moderate stool volume noted right colon with transverse and left colon largely free of stool. A few  scattered left colonic diverticuli evident without diverticulitis. Vascular/Lymphatic: There is mild atherosclerotic calcification of the abdominal aorta without aneurysm. There is no gastrohepatic or hepatoduodenal ligament lymphadenopathy. No retroperitoneal or mesenteric lymphadenopathy. No pelvic sidewall lymphadenopathy. Reproductive: Uterus surgically absent.  There is no adnexal mass. Other: No intraperitoneal free fluid. Musculoskeletal: No worrisome lytic or sclerotic osseous abnormality. IMPRESSION: 1. No acute findings in the abdomen or pelvis. Specifically, no evidence for bowel obstruction. 2. Moderate to large hiatal hernia with 50-75% of the stomach contained in the chest. This is not substantially changed since prior study. 3. Mild left colonic diverticulosis without diverticulitis. 4. Aortic Atherosclerosis (ICD10-I70.0). Electronically Signed   By: Misty Stanley M.D.   On: 03/04/2021 12:59    Procedures Procedures    Medications Ordered in ED Medications  sorbitol, milk of mag, mineral oil, glycerin (SMOG) enema (has no administration in time range)  polyethylene glycol (MIRALAX / GLYCOLAX) packet 17 g (17 g Oral Given 03/04/21 1558)    ED Course/ Medical Decision Making/ A&P                           Medical Decision Making Presenting for symptoms of constipation despite taking Linzess.  Problems Addressed: Drug-induced constipation: chronic illness or injury  Amount and/or Complexity of Data Reviewed Labs: ordered.    Details: CBC, metabolic panel, lipase and urinalysis-normal findings Radiology: ordered.    Details: CT abdomen pelvis-no acute abnormalities.  Increased fecalization, right colon.  Doubt obstruction or perforation.  Risk Risk Details: Ordered enema to assist patient with evacuation.  She declined it and wanted to leave before receiving enema.  She is advised to use MiraLAX and home enemas.           Final Clinical Impression(s) / ED  Diagnoses Final diagnoses:  Drug-induced constipation    Rx / DC Orders ED Discharge Orders     None         Daleen Bo, MD 03/04/21 2045

## 2021-03-04 NOTE — ED Triage Notes (Signed)
Pt arrived via POV for nausea, 7/10 diffuse abdominal pain, pt states abdomen is swollenx1wk. Pt last BM over a wk ago. Pt states she manually disimpacted herself this morning and got some stool out. Pt states she believes she has a bowel blockage.

## 2021-03-04 NOTE — ED Notes (Signed)
Pt left without dc vital signs and enema.

## 2021-03-04 NOTE — Discharge Instructions (Signed)
To help your constipation improve I recommend that you use MiraLAX, 2-3 times a day until you are having a bowel movement.  It will also help to use a fleets enema 2 or 3 times.  Follow-up with your doctor for further care and treatment.  Make sure you are not eating a high-fiber diet and drinking a lot of water.

## 2021-03-10 NOTE — Telephone Encounter (Signed)
Pt ready for scheduling on or after 03/02/21  Out-of-pocket cost due at time of visit: $226 (deductible)  Primary: Medicare Prolia co-insurance: 20% (approximately $276) Admin fee co-insurance: 20% (approximately $25)  Secondary: [employer] [place] [employer] Prolia co-insurance: will consider Medicare Part B co-insurance but does not cover Medicare deductible Admin fee co-insurance: will consider Medicare Part B co-insurance but does not cover Medicare deductible  Deductible: $0 of $226 met (NOT covered by secondary)  Prior Auth: not required PA# Valid:   ** This summary of benefits is an estimation of the patient's out-of-pocket cost. Exact cost may vary based on individual plan coverage.

## 2021-04-11 NOTE — Telephone Encounter (Signed)
Per visit 12/2020: ? ?Gerome Apley, MD - 12/19/2020 3:59 PM EST ?Formatting of this note might be different from the original. ?Labs in August and October found very high parathyroid hormone (PTH) levels. ?However, you calcium level in August was low-normal, vitamin D level was very high and your kidney function normal. ?High PTH level could be from Prolia making it harder to get calcium out of your bones and into your blood. Your last dose of Prolia was in July ?Check labs today for current evaluation. This includes a 24 hour urine collection for calcium. ?Stop Vitamin D2 50,000IU #1 capsule once a week. ?Recommend taking over-the-counter Citracal+D 500 or 600mg  #1 tablet twice a day with meals. ?Repeat labs in 3 months at [facility] in [place]. Call office when arriving to have lab orders faxed. ?Follow-up in 3 months. ?Electronically signed by Gerome Apley, MD at 12/19/2020 4:04 PM EST ?

## 2021-05-20 ENCOUNTER — Other Ambulatory Visit: Payer: Self-pay | Admitting: Internal Medicine

## 2021-05-20 DIAGNOSIS — F339 Major depressive disorder, recurrent, unspecified: Secondary | ICD-10-CM

## 2021-05-21 ENCOUNTER — Telehealth: Payer: Self-pay | Admitting: Family Medicine

## 2021-05-21 ENCOUNTER — Ambulatory Visit (HOSPITAL_COMMUNITY)
Admission: EM | Admit: 2021-05-21 | Discharge: 2021-05-21 | Disposition: A | Discharge status: Home / Self Care | Payer: Medicare Other | Attending: Emergency Medicine | Admitting: Emergency Medicine

## 2021-05-21 ENCOUNTER — Encounter (HOSPITAL_COMMUNITY): Payer: Self-pay

## 2021-05-21 DIAGNOSIS — G43911 Migraine, unspecified, intractable, with status migrainosus: Secondary | ICD-10-CM | POA: Diagnosis not present

## 2021-05-21 MED ORDER — METOCLOPRAMIDE HCL 5 MG/ML IJ SOLN
INTRAMUSCULAR | Status: AC
  Filled 2021-05-21: qty 2

## 2021-05-21 MED ORDER — KETOROLAC TROMETHAMINE 30 MG/ML IJ SOLN
30.0000 mg | Freq: Once | INTRAMUSCULAR | Status: AC
  Administered 2021-05-21: 30 mg via INTRAMUSCULAR

## 2021-05-21 MED ORDER — DEXAMETHASONE SODIUM PHOSPHATE 10 MG/ML IJ SOLN
10.0000 mg | Freq: Once | INTRAMUSCULAR | Status: AC
  Administered 2021-05-21: 10 mg via INTRAMUSCULAR

## 2021-05-21 MED ORDER — PROCHLORPERAZINE EDISYLATE 10 MG/2ML IJ SOLN: 10.0000 mg | Freq: Once | INTRAMUSCULAR | Status: DC

## 2021-05-21 MED ORDER — METOCLOPRAMIDE HCL 5 MG/ML IJ SOLN
10.0000 mg | Freq: Once | INTRAMUSCULAR | Status: AC
  Administered 2021-05-21: 10 mg via INTRAMUSCULAR

## 2021-05-21 MED ORDER — KETOROLAC TROMETHAMINE 30 MG/ML IJ SOLN
INTRAMUSCULAR | Status: AC
  Filled 2021-05-21: qty 1

## 2021-05-21 MED ORDER — DEXAMETHASONE SODIUM PHOSPHATE 10 MG/ML IJ SOLN
INTRAMUSCULAR | Status: AC
  Filled 2021-05-21: qty 1

## 2021-05-21 NOTE — ED Provider Notes (Addendum)
?[place] ? ? ? ?CSN: [id number] ?Arrival date & time: 05/21/21  1401 ? ? ?  ? ?History   ?Chief Complaint ?Chief Complaint  ?Patient presents with  ? Headache  ? ? ?HPI ?Jill Salinas is a 76 y.o. female.  ? ?Patient presents with concerns of migraine headache for the past week. The patient has history of chronic migraines, followed by neurology. She states she has been taking her triptan and Emgality which lately have been controlling her headaches. However, this time they don't seem to be helping as much as usual. She reports the headache is all over, more in the back of her head, and similar to her usual migraines. She has sensitivity to light and sound, nausea with some vomiting, fatigue. She denies any head injury. The patient called her neurologist and was told to come to urgent care.  ? ?The history is provided by the patient.  ?Headache ?Associated symptoms: fatigue, nausea, photophobia and vomiting   ?Associated symptoms: no numbness and no weakness   ? ?Past Medical History:  ?Diagnosis Date  ? Bowel obstruction ([place])   ? Breast cancer ([place])   ? Headache(784.0)   ? Migraine   ? Vision abnormalities   ? glaucoma  ? ? ?Patient Active Problem List  ? Diagnosis Date Noted  ? B12 deficiency 08/31/2019  ? Fatigue 11/09/2018  ? Migraine without status migrainosus, not intractable 11/09/2018  ? Depression, recurrent ([place]) 09/16/2018  ? Dizziness 06/11/2018  ? New onset headache 06/11/2018  ? Migraine   ? Neck pain 08/04/2013  ? Leg pain 08/04/2013  ? Low back pain 06/25/2013  ? Migraine without aura 06/15/2012  ? ? ?Past Surgical History:  ?Procedure Laterality Date  ? BREAST LUMPECTOMY    ? TOTAL ABDOMINAL HYSTERECTOMY N/A   ? ? ?OB History   ?No obstetric history on file. ?  ? ? ? ?Home Medications   ? ?Prior to Admission medications   ?Medication Sig Start Date End Date Taking? Authorizing Provider  ?Buprenorphine 15 MCG/HR PTWK as needed. 10/01/18   [provider]  ?cyanocobalamin  (,VITAMIN B-12,) 1000 MCG/ML injection ADMINISTER 1 ML (1,000 MCG) INTRAMUSCULARLY MONTHLY 10 ML BOTTLE 10/10/20   Isaac Bliss, Rayford Halsted, MD  ?denosumab (PROLIA) 60 MG/ML SOLN injection Inject 60 mg into the skin every 6 (six) months. Administer in upper arm, thigh, or abdomen    [provider]  ?diclofenac sodium (VOLTAREN) 1 % GEL Apply 2 g topically as needed.    [provider]  ?diphenhydrAMINE (BENADRYL) 50 MG tablet Take 1 tablet (50 mg total) by mouth once for 1 dose. Pt to take 50 mg of Benadryl on 06/19/20 @ 1:40 PM. Please call [phone] with any questions. 06/08/20 06/08/20  Logan Bores, MD  ?DULoxetine (CYMBALTA) 60 MG capsule Take 60 mg by mouth daily. 10/06/20   [provider]  ?folic acid (FOLVITE) 1 MG tablet TAKE 1 TABLET BY MOUTH EVERY DAY 05/21/21   Isaac Bliss, Rayford Halsted, MD  ?Galcanezumab-gnlm [facility]) 120 MG/ML SOAJ Inject 120 mg into the skin every 30 (thirty) days. 11/27/20   Lomax, Decari Duggar, NP  ?Rolan Lipa 290 MCG CAPS capsule TAKE 1 CAPSULE BY MOUTH EVERY DAY BEFORE BREAKFAST 12/26/20   Isaac Bliss, Rayford Halsted, MD  ?LUMIGAN 0.01 % SOLN Place 1 drop into both eyes at bedtime. 11/13/15   [provider]  ?Misc Natural Products (LEG VEIN & CIRCULATION PO) Take by mouth.    [provider]  ?modafinil (PROVIGIL)  200 MG tablet Take 200 mg by mouth daily as needed.  06/17/16   [provider]  ?Multiple Vitamins-Minerals (ONE-A-DAY VITACRAVES IMMUNITY PO) Take by mouth.    [provider]  ?naratriptan (AMERGE) 2.5 MG tablet Take 1 tablet (2.5 mg total) by mouth as needed for migraine. Take one (1) tablet at onset of headache; if returns or does not resolve, may repeat after 4 hours; do not exceed five (5) mg in 24 hours. 11/27/20   Lomax, Zarius Furr, NP  ?ondansetron (ZOFRAN) 4 MG tablet TAKE 1 TABLET BY MOUTH EVERY 8 HOURS AS NEEDED 11/20/20   Lomax, Mylisa Brunson, NP  ?oxyCODONE-acetaminophen (PERCOCET) 10-325 MG tablet Filled by pain clinic  06/17/16   [provider]  ?polyethylene glycol (MIRALAX / GLYCOLAX) packet Take 17 g by mouth daily.    [provider]  ?predniSONE (DELTASONE) 50 MG tablet Pt to take 50 mg of prednisone on 06/19/20 at 1:40 AM, 50 mg of prednisone on 06/19/20 at 7:40 AM, and 50 mg of prednisone on 06/19/20 at 1:40 PM. Pt is also to take 50 mg of benadryl on 06/19/20 at 1:40 PM. Please call [phone] with any questions. 06/08/20   Logan Bores, MD  ?propranolol ER (INDERAL LA) 60 MG 24 hr capsule Take 1 capsule (60 mg total) by mouth daily. 06/10/19   Lomax, Ladaja Yusupov, NP  ?tretinoin (RETIN-A) 0.025 % cream APPLY TOPICALLY AT BEDTIME. 02/29/20   Isaac Bliss, Rayford Halsted, MD  ?vitamin B-12 (CYANOCOBALAMIN) 1000 MCG tablet Take 1,000 mcg by mouth daily.    [provider]  ?Vitamin D, Ergocalciferol, (DRISDOL) 1.25 MG (50000 UNIT) CAPS capsule TAKE 1 CAPSULE BY MOUTH EVERY 7 DAYS 07/18/20   Philemon Kingdom, MD  ?zolpidem (AMBIEN) 5 MG tablet Take 5 mg by mouth at bedtime as needed for sleep.    [provider]  ? ? ?Family History ?Family History  ?Problem Relation Age of Onset  ? Heart disease Mother   ? Headache Mother   ? Heart attack Father   ? ? ?Social History ?Social History  ? ?Tobacco Use  ? Smoking status: Never  ? Smokeless tobacco: Never  ?Vaping Use  ? Vaping Use: Never used  ?Substance Use Topics  ? Alcohol use: Yes  ?  Alcohol/week: 2.0 standard drinks  ?  Types: 2 Standard drinks or equivalent per week  ?  Comment: occ  ? Drug use: No  ? ? ? ?Allergies   ?Depakote [divalproex sodium], Gabapentin, Iodinated contrast media, Codeine, and Nortriptyline ? ? ?Review of Systems ?Review of Systems  ?Constitutional:  Positive for fatigue.  ?Eyes:  Positive for photophobia.  ?Respiratory:  Negative for shortness of breath.   ?Gastrointestinal:  Positive for nausea and vomiting.  ?Skin:  Negative for rash.  ?Neurological:  Positive for headaches. Negative for syncope, weakness and numbness.  ? ? ?Physical  Exam ?Triage Vital Signs ?ED Triage Vitals  ?Enc Vitals Group  ?   BP 05/21/21 1521 (!) 148/87  ?   Pulse Rate 05/21/21 1521 95  ?   Resp 05/21/21 1521 16  ?   Temp 05/21/21 1521 98.8 ?F (37.1 ?C)  ?   Temp Source 05/21/21 1521 Oral  ?   SpO2 05/21/21 1521 96 %  ?   Weight --   ?   Height --   ?   Head Circumference --   ?   Peak Flow --   ?   Pain Score 05/21/21 1519 10  ?  Pain Loc --   ?   Pain Edu? --   ?   Excl. in [place]? --   ? ?No data found. ? ?Updated Vital Signs ?BP (!) 148/87 (BP Location: Left Arm)   Pulse 95   Temp 98.8 ?F (37.1 ?C) (Oral)   Resp 16   SpO2 96%  ? ?Visual Acuity ?Right Eye Distance:   ?Left Eye Distance:   ?Bilateral Distance:   ? ?Right Eye Near:   ?Left Eye Near:    ?Bilateral Near:    ? ?Physical Exam ?Vitals and nursing note reviewed.  ?Constitutional:   ?   General: She is not in acute distress. ?HENT:  ?   Head: Normocephalic and atraumatic.  ?Eyes:  ?   Extraocular Movements: Extraocular movements intact.  ?   Conjunctiva/sclera: Conjunctivae normal.  ?   Pupils: Pupils are equal, round, and reactive to light.  ?Cardiovascular:  ?   Rate and Rhythm: Normal rate and regular rhythm.  ?   Heart sounds: Normal heart sounds.  ?Pulmonary:  ?   Effort: Pulmonary effort is normal.  ?   Breath sounds: Normal breath sounds.  ?Musculoskeletal:  ?   Cervical back: Normal range of motion.  ?Neurological:  ?   Mental Status: She is alert.  ?   Gait: Gait normal.  ?Psychiatric:     ?   Mood and Affect: Mood normal.  ? ? ? ?UC Treatments / Results  ?Labs ?(all labs ordered are listed, but only abnormal results are displayed) ?Labs Reviewed - No data to display ? ?EKG ? ? ?Radiology ?No results found. ? ?Procedures ?Procedures (including critical care time) ? ?Medications Ordered in UC ?Medications  ?dexamethasone (DECADRON) injection 10 mg (has no administration in time range)  ?ketorolac (TORADOL) 30 MG/ML injection 30 mg (has no administration in time range)  ?metoCLOPramide (REGLAN)  injection 10 mg (has no administration in time range)  ? ? ?Initial Impression / Assessment and Plan / UC Course  ?I have reviewed the triage vital signs and the nursing notes. ? ?Pertinent labs & imaging results that we

## 2021-05-21 NOTE — Discharge Instructions (Addendum)
Injections given today to help with headache. Go to the ER if headache worsens or new concerning symptoms such as pass out. Follow-up with neurology for further management.  ?

## 2021-05-21 NOTE — Telephone Encounter (Signed)
Pt called and spoke to Dale. She stated that someone had recommended for her to come in and receive an injection to alleviate a headache that she has been suffering from for days. Pt confirmed that whom she spoke to was not from this office that had recommended the injection. Pt got agitated with Naida Sleight and started commenting that she will just come in to the office and sit in the waiting room and wait till her head exploded and she repeated and I quote "I really hope my head does explode"  ?Jill Salinas then transferred the call to me and I started to listen to her repeat herself and also state that she had administered her Emgality Inj and it did not work for her. I informed  her that unfortunately we are not a walk in clinic or an emergency clinic so we advise for her to go to an Urgent Care or to the ED. Pt proceeded to say and I quote "You have got to be (F bomb) kidding me ! I was just at Urgent Care yesterday and they told me to go to the ED. I stated to her to go ahead and follow those directions because that is what we would advise as well especially since there were no openings in our office today. She elevated her voice and stated and I quote "This is (F bomb) ridiculous ! "So am I to tell the Emergency Room that you, Jill Salinas told me to go?" I informed her that this is the office policies and that it will be documented that the pt was advised to go to the ER due to pt having a headache for days with vomiting. Pt started yelling "Can you please stop repeating to me that I need to go to the ER. Im old enough to understand and not have things repeated to me." I informed the pt that I was only trying to make sure that she would go to the ED since this has been going on for days.  Pt was really Irate and proceeded to say "Well you have really been some help. I hope no one else has to be advised by you today !" I told the pt thank you for calling said my goodbye and proceeded to disconnect the call.   ?

## 2021-05-21 NOTE — ED Triage Notes (Signed)
Pt presents today with a headaches for the past 7 days. Pt states history of migraines.  Pt states vomiting. Pt states swelling in her throat.  ?

## 2021-05-24 ENCOUNTER — Telehealth: Payer: Self-pay | Admitting: Family Medicine

## 2021-05-24 ENCOUNTER — Other Ambulatory Visit: Payer: Self-pay | Admitting: Family Medicine

## 2021-05-24 DIAGNOSIS — G43009 Migraine without aura, not intractable, without status migrainosus: Secondary | ICD-10-CM

## 2021-05-24 MED ORDER — ONDANSETRON HCL 4 MG PO TABS: 4.0000 mg | ORAL_TABLET | Freq: Three times a day (TID) | ORAL | 0 refills | Status: AC | PRN

## 2021-05-24 MED ORDER — NURTEC 75 MG PO TBDP
75.0000 mg | ORAL_TABLET | Freq: Every day | ORAL | 11 refills | Status: AC | PRN
Start: 2021-05-24 — End: ?

## 2021-05-24 NOTE — Telephone Encounter (Signed)
Pt would like to know if she is to keep taking her prescribed naratriptan (AMERGE) 2.5 MG tablet pt states it is not helping headaches the way is has before. Has not been working for her for about 2 weeks.  ?Pt went to urgent care and got injection ( does not know the name)  that did not help headache.Pt would like a call back.  ?[phone]  ?

## 2021-05-24 NOTE — Telephone Encounter (Signed)
Received feedback from Amy, NP ?I have reviewed recent notes from UC on 05/21/2021. She was given Toradol, dexamethasone and metoclopramide injection. She can continue current treatment plan. She should be taking Emgality every 30 days and Amerge as needed (remind her of approp admin of 1 at onset, repeat 1 in 2 hours, max 2 in 24 hours or 10 per month). May also take ondansetron as needed. I will call in Nurtec to see if we can get this covered for her in event Amerge does not help. She can take Nurtec '75mg'$  daily as needed for migraine abortion.  ? ?I called pt and relayed this information. She verbalized understanding and appreciation for this recommendation. She currently is out of zofran so this rx has been sent. I advised the nurtec will likely need a PA and I have proactively sent one in through [facility] (Key: [facility]).  ?Will update accordingly.  ? ? ?

## 2021-05-28 NOTE — Telephone Encounter (Signed)
PA approved for the patient 05/24/2021 until further notice.  10 tab over 30 days.  ?

## 2021-05-29 ENCOUNTER — Encounter: Payer: Self-pay | Admitting: Family Medicine

## 2021-06-28 ENCOUNTER — Ambulatory Visit: Payer: Medicare Other | Admitting: Internal Medicine

## 2021-07-09 ENCOUNTER — Other Ambulatory Visit: Payer: Self-pay | Admitting: Internal Medicine

## 2021-07-09 DIAGNOSIS — G894 Chronic pain syndrome: Secondary | ICD-10-CM

## 2021-07-24 ENCOUNTER — Other Ambulatory Visit: Payer: Self-pay | Admitting: Internal Medicine

## 2021-07-29 ENCOUNTER — Other Ambulatory Visit: Payer: Self-pay | Admitting: Internal Medicine

## 2021-08-02 ENCOUNTER — Other Ambulatory Visit: Payer: Self-pay | Admitting: Internal Medicine

## 2021-08-02 DIAGNOSIS — G894 Chronic pain syndrome: Secondary | ICD-10-CM

## 2021-08-10 ENCOUNTER — Other Ambulatory Visit: Payer: Self-pay | Admitting: Internal Medicine

## 2021-08-16 ENCOUNTER — Encounter: Payer: Self-pay | Admitting: Internal Medicine

## 2021-08-16 ENCOUNTER — Ambulatory Visit (INDEPENDENT_AMBULATORY_CARE_PROVIDER_SITE_OTHER): Payer: Medicare Other | Admitting: Internal Medicine

## 2021-08-16 VITALS — BP 120/78 | HR 101 | Ht 60.0 in | Wt 140.4 lb

## 2021-08-16 DIAGNOSIS — M818 Other osteoporosis without current pathological fracture: Secondary | ICD-10-CM | POA: Diagnosis not present

## 2021-08-16 DIAGNOSIS — E211 Secondary hyperparathyroidism, not elsewhere classified: Secondary | ICD-10-CM

## 2021-08-16 MED ORDER — DENOSUMAB 60 MG/ML ~~LOC~~ SOSY
60.0000 mg | PREFILLED_SYRINGE | Freq: Once | SUBCUTANEOUS | Status: AC
  Administered 2021-08-16: 60 mg via SUBCUTANEOUS

## 2021-08-16 NOTE — Progress Notes (Signed)
Patient verbally confirmed name, date of birth, and correct medication to be administered. Prolia injection administered and pt tolerated well.  

## 2021-08-16 NOTE — Patient Instructions (Addendum)
Please stop at the lab.  Continue ergocalciferol 50,000 units weekly.  Let's restart Prolia.   Try to get ~1200 mg calcium daily.  Please come back for a follow-up appointment in 1 year.

## 2021-08-16 NOTE — Progress Notes (Signed)
Patient ID: Jill Salinas, female   DOB: Jul 07, 1945, 76 y.o.   MRN: [id number]   HPI  Jill Salinas is a 76 y.o.-year-old female, initially referred by her PCP, Dr. Jerilee Hoh, returning for follow-up for osteoporosis (OP).  Last visit 1 year ago. She saw Endocrinologist in [place].  Interim history: No fractures or falls since last visit.   She has leg weakness and also 2 low back herniated disc.  She cannot exercise. She continues to have significant fatigue, leg edema, muscle and joint aches, increased thirst.  She mentions she has memory loss.  Since last visit, she saw Dr. Hartford Poli with [facility] endocrinology.  She is not sure why she went to see him.  She only saw him once, on 12/19/2020.  I reviewed his note.  He diagnosed her with secondary hyperparathyroidism most likely due to low urinary calcium.  At that time, the 24-hour urine calcium was <16.  He suggested to start calcium citrate -combined with vitamin D twice a day, but she did not do so.  She continues on ergocalciferol 50,000 units weekly.  Reviewed and addended history: Pt was dx with OP >10 years ago.  I reviewed pt's DXA scan reports: Date L1-L4 T score FN T score 33% distal Radius  06/07/2019 ([facility])  -2.2 RFN: -2.8 LFN: -2.9 n/a   No dizziness/vertigo/orthostasis/poor vision. She has imbalance and has pain in her legs - knees. Also swelling and pain in legs.  Previous OP treatments:  - po Bisphosphonate: Fosamax - no significant effect - Forteo several years ago  - Prolia since started 2004-2005 years ago - until 09/2018 >> generalized bone pain - Restarted Prolia 07/2019, but she did not continue... - Restarted Prolia 08/29/2020, but she did not continue...  No h/o vitamin D deficiency. Reviewed available vit D levels: 12/19/2020: Vitamin D 56.3, calcitriol 92.5 (24.8-81.5) 08/2020: Vitamin D 105 (per reviewof Dr. Shirlyn Goltz note) Lab Results  Component Value Date   VD25OH 62.4 06/22/2020   VD25OH 80.41 06/22/2019   VD25OH  78.31 09/16/2018   VD25OH 67.5 05/12/2017   Pt is on: - vitamin D (ergocalciferol) 50,000 units once a week   No weight bearing exercises 2/2 joint pain.  She does not take high vitamin A doses.  Of note, she has a history of steroid injections in the spine. Also, as a child - she had eczema so she was on steroids long-term: im for many years. She is on oxycodone and seen in pain management clinic for bone and joint pains.  Menopause was at 76 y/o (surgical). No HRT.  FH of osteoporosis: Mother and father-both with hip fractures.  No h/o hyper/hypocalcemia or hyperparathyroidism. No h/o kidney stones. 07/27/2021: Calcium 9.8, albumin 4.7 Lab Results  Component Value Date   CALCIUM 10.0 03/04/2021   CALCIUM 9.8 11/25/2019   CALCIUM 9.9 06/22/2019   CALCIUM 9.6 09/16/2018   CALCIUM 9.4 11/21/2015    Ref Range & Units 12/24/2020 Comments  Calcium, Ur Not Estab. mg/dL <0.8  **Verified by repeat analysis**  Total Volume: 2000 mL  Calcium, 24H Urine 0 - 320 mg/24 hr <16    Creatinine, Urine Not Estab. mg/dL 29.5    Calcium/Creat.Ratio 29 - 442 mg/g creat <27 Low      No h/o thyrotoxicosis. Reviewed TSH recent levels:  07/27/2021: TSH 4.0 Lab Results  Component Value Date   TSH 3.49 09/16/2018   TSH 2.290 05/12/2017   No h/o CKD. Last BUN/Cr: 07/27/2021: BUN/Cr 17/0.99 Lab Results  Component Value  Date   BUN 12 03/04/2021   CREATININE 0.91 03/04/2021   12/19/2020: Magnesium 2.4 (1.6-2.3)  She has a history of GERD and is on Nexium.  She has a history of breast cancer, s/p chemotherapy and radiation therapy.  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  ROS: + See HPI  I reviewed pt's medications, allergies, PMH, social hx, family hx, and changes were documented in the history of present illness. Otherwise, unchanged from my initial visit note.  Past Medical History:  Diagnosis Date   Bowel obstruction ([place])    Breast cancer  ([place])    Headache(784.0)    Migraine    Vision abnormalities    glaucoma   Past Surgical History:  Procedure Laterality Date   BREAST LUMPECTOMY     TOTAL ABDOMINAL HYSTERECTOMY N/A    Social History   Socioeconomic History   Marital status: Married    Spouse name: Jill Salinas    Number of children: 0   Years of education: Assoc   Highest education level: Not on file  Occupational History   Occupation: retired  Tobacco Use   Smoking status: Never   Smokeless tobacco: Never  Vaping Use   Vaping Use: Never used  Substance and Sexual Activity   Alcohol use: Yes    Alcohol/week: 2.0 standard drinks of alcohol    Types: 2 Standard drinks or equivalent per week    Comment: occ   Drug use: No   Sexual activity: Not on file  Other Topics Concern   Not on file  Social History Narrative   Patient lives at home with her husband. Jill Salinas    Patient has an Geophysicist/field seismologist.    Patient has no children.    Patient is right handed.    Social Determinants of Health   Financial Resource Strain: Not on file  Food Insecurity: Not on file  Transportation Needs: Not on file  Physical Activity: Not on file  Stress: Not on file  Social Connections: Not on file  Intimate Partner Violence: Not on file   Current Outpatient Medications on File Prior to Visit  Medication Sig Dispense Refill   Buprenorphine 15 MCG/HR PTWK as needed.     cyanocobalamin (,VITAMIN B-12,) 1000 MCG/ML injection ADMINISTER 1 ML (1,000 MCG) INTRAMUSCULARLY MONTHLY 10 ML BOTTLE 10 mL 1   denosumab (PROLIA) 60 MG/ML SOLN injection Inject 60 mg into the skin every 6 (six) months. Administer in upper arm, thigh, or abdomen     diclofenac sodium (VOLTAREN) 1 % GEL Apply 2 g topically as needed.     diphenhydrAMINE (BENADRYL) 50 MG tablet Take 1 tablet (50 mg total) by mouth once for 1 dose. Pt to take 50 mg of Benadryl on 06/19/20 @ 1:40 PM. Please call [phone] with any questions. 30 tablet 0   DULoxetine (CYMBALTA)  60 MG capsule Take 60 mg by mouth daily.     folic acid (FOLVITE) 1 MG tablet TAKE 1 TABLET BY MOUTH EVERY DAY 90 tablet 0   Galcanezumab-gnlm (EMGALITY) 120 MG/ML SOAJ Inject 120 mg into the skin every 30 (thirty) days. 1 mL 11   LINZESS 290 MCG CAPS capsule TAKE 1 CAPSULE BY MOUTH EVERY DAY BEFORE BREAKFAST 90 capsule 1   LUMIGAN 0.01 % SOLN Place 1 drop into both eyes at bedtime.     Misc Natural Products (LEG VEIN & CIRCULATION PO) Take by mouth.     modafinil (PROVIGIL) 200  MG tablet Take 200 mg by mouth daily as needed.   1   Multiple Vitamins-Minerals (ONE-A-DAY VITACRAVES IMMUNITY PO) Take by mouth.     naratriptan (AMERGE) 2.5 MG tablet Take 1 tablet (2.5 mg total) by mouth as needed for migraine. Take one (1) tablet at onset of headache; if returns or does not resolve, may repeat after 4 hours; do not exceed five (5) mg in 24 hours. 10 tablet 5   ondansetron (ZOFRAN) 4 MG tablet Take 1 tablet (4 mg total) by mouth every 8 (eight) hours as needed. 20 tablet 0   oxyCODONE-acetaminophen (PERCOCET) 10-325 MG tablet Filled by pain clinic  0   polyethylene glycol (MIRALAX / GLYCOLAX) packet Take 17 g by mouth daily.     predniSONE (DELTASONE) 50 MG tablet Pt to take 50 mg of prednisone on 06/19/20 at 1:40 AM, 50 mg of prednisone on 06/19/20 at 7:40 AM, and 50 mg of prednisone on 06/19/20 at 1:40 PM. Pt is also to take 50 mg of benadryl on 06/19/20 at 1:40 PM. Please call [phone] with any questions. 3 tablet 0   Rimegepant Sulfate (NURTEC) 75 MG TBDP Take 75 mg by mouth daily as needed (take for abortive therapy of migraine, no more than 1 tablet in 24 hours or 10 per month). 8 tablet 11   tretinoin (RETIN-A) 0.025 % cream APPLY TOPICALLY AT BEDTIME. 45 g 1   vitamin B-12 (CYANOCOBALAMIN) 1000 MCG tablet Take 1,000 mcg by mouth daily.     Vitamin D, Ergocalciferol, (DRISDOL) 1.25 MG (50000 UNIT) CAPS capsule TAKE 1 CAPSULE BY MOUTH ONE TIME PER WEEK 4 capsule 0   zolpidem (AMBIEN) 5 MG tablet Take 5  mg by mouth at bedtime as needed for sleep.     No current facility-administered medications on file prior to visit.   Allergies  Allergen Reactions   Depakote [Divalproex Sodium] Nausea And Vomiting   Gabapentin Other (See Comments)    Made her "feel crazy"   Iodinated Contrast Media Itching    Throat itching, pt received a 13 hour prep for injection   Codeine    Nortriptyline Other (See Comments)    constipation   Family History  Problem Relation Age of Onset   Heart disease Mother    Headache Mother    Heart attack Father     PE: BP 120/78 (BP Location: Left Arm, Patient Position: Sitting, Cuff Size: Normal)   Pulse (!) 101   Ht 5' (1.524 m)   Wt 140 lb 6.4 oz (63.7 kg)   SpO2 98%   BMI 27.42 kg/m  Wt Readings from Last 3 Encounters:  08/16/21 140 lb 6.4 oz (63.7 kg)  03/04/21 138 lb 0.1 oz (62.6 kg)  11/27/20 138 lb (62.6 kg)   Constitutional: Normal weight, in NAD. No kyphosis. Eyes: PERRLA, EOMI, no exophthalmos ENT: moist mucous membranes, no thyromegaly, no cervical lymphadenopathy Cardiovascular: RRR, No MRG Respiratory: CTA B Musculoskeletal: no deformities, strength intact in all 4 Skin: moist, warm, no rashes Neurological: + mild  tremor with outstretched hands, DTR normal in all 4  Assessment: 1. Osteoporosis  Plan: 1. Osteoporosis -Likely postmenopausal (she had early, surgical, menopause)/ age-related and she also has family history of osteoporosis and a long history of steroid use (IM injections and also spine injections). -Latest DXA scan was reviewed and she had an increased risk for fractures based on the T-scores. -She had no falls or fractures since last visit -We again discussed fall precautions.  I  also recommended exercise a higher protein diet (approximately 50 g daily for her -but ideally plant, not animal protein), and at last visit we discussed about the concept of low acid eating -At last visit we discussed about osteoporotic  medication regimen I suggested to start Prolia after discussion about benefits and possible side effects. -She got her first Prolia injection 07/2019, which she tolerated well -Unfortunately, she did not continue with the injections as she had back and left thigh pain.  She had imaging: MRI of thoracic spine and also CT abdomen which did not show fractures.  Of note, she is followed by pain management in [place] and is on Percocet.  She did not tolerate Neurontin, Lyrica, or other medicines -At this visit, we discussed about resuming Prolia and I strongly advised her not to miss Prolia or delayed for more than 1 month - Will check a vitamin D level today and if normal, will proceed with a new Prolia injection.  Latest kidney function was normal 11/2019 -Plan to check a new DXA scan 2 years from the previous, 05/2021- the first indication that the treatment is working is her not having fractures. DXA scan changes are secondary: unchanged or slightly higher T-scores are desirable - will see pt back in 1 year  Component     Latest Ref Rng & Units 06/22/2020  Vitamin D, 25-Hydroxy     30.0 - 100.0 ng/mL 62.4  Normal vitamin D.  We will try to start her back on Prolia ASAP.  Philemon Kingdom, MD PhD [facility] Endocrinology   Since last visit, she saw Dr. Hartford Poli who diagnosed her with secondary hyperparathyroidism.  This was believed to be related to low urinary calcium.  Her 24-hour urine calcium was <27.  Her ergocalciferol 50,000 units weekly dose was stopped (she had a high vitamin D of 105 in 09/2020).  She was started on Citracal with vitamin D twice a day with meals.  Repeat vitamin D level was normal, at 56.3 on 12/19/2020.  Calcitriol level was high at 92.5 (24.8-81.5), magnesium was slightly high at 2.4 (1.6-2.3), a Phos was normal at 93 (44-121).  Reviewed and addended history: Pt was dx with OP >10 years ago.  I reviewed pt's DXA scan reports: Date L1-L4 T score FN T score 33% distal  Radius  06/07/2019 ([facility])  -2.2 RFN: -2.8 LFN: -2.9 n/a   No dizziness/vertigo/orthostasis/poor vision.  She has imbalance and has pain in her legs - knees. Also swelling and pain in legs.  Previous OP treatments:  - po Bisphosphonate: Fosamax - no significant effect - Forteo several years ago  - Prolia since started 2004-2005 years ago - until 09/2018 >> generalized bone pain - Restarted Prolia 07/2019, but she only had 1 dose on 08/29/2020  No h/o vitamin D deficiency. Reviewed available vit D levels: Lab Results  Component Value Date   VD25OH 62.4 06/22/2020   VD25OH 80.41 06/22/2019   VD25OH 78.31 09/16/2018   VD25OH 67.5 05/12/2017   Pt is on: -Calcitriol-vitamin D twice a day  No weight bearing exercises 2/2 joint pain.  She does not take high vitamin A doses.  Of note, she has a history of steroid injections in the spine. Also, as a child - she had eczema so she was on steroids long-term: im for many years. She is on oxycodone and seen in pain management clinic for bone and joint pains.  Menopause was at 76 y/o (surgical). No HRT.  FH of osteoporosis: Mother and father-both  with hip fractures.  No h/o hyper/hypocalcemia or hyperparathyroidism. No h/o kidney stones. Lab Results  Component Value Date   CALCIUM 10.0 03/04/2021   CALCIUM 9.8 11/25/2019   CALCIUM 9.9 06/22/2019   CALCIUM 9.6 09/16/2018   CALCIUM 9.4 11/21/2015   No h/o thyrotoxicosis. Reviewed TSH recent levels:  Lab Results  Component Value Date   TSH 3.49 09/16/2018   TSH 2.290 05/12/2017   No h/o CKD. Last BUN/Cr: Lab Results  Component Value Date   BUN 12 03/04/2021   CREATININE 0.91 03/04/2021   She has a history of GERD and is on Nexium.  She has a history of breast cancer, s/p chemotherapy and radiation therapy.  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  ROS: Constitutional: no weight gain/no weight loss, + fatigue Eyes: no blurry  vision, no xerophthalmia ENT: no sore throat, no nodules palpated in neck, no dysphagia, no odynophagia, no hoarseness Cardiovascular: no CP/no SOB/no palpitations/+ leg swelling Respiratory: no cough/no SOB/no wheezing Gastrointestinal: no N/no V/no D/no C/+ acid reflux Musculoskeletal: + muscle aches/+ joint aches Skin: no rashes, + hair loss Neurological: no tremors/no numbness/no tingling/no dizziness  I reviewed pt's medications, allergies, PMH, social hx, family hx, and changes were documented in the history of present illness. Otherwise, unchanged from my initial visit note.  Past Medical History:  Diagnosis Date   Bowel obstruction ([place])    Breast cancer ([place])    Headache(784.0)    Migraine    Vision abnormalities    glaucoma   Past Surgical History:  Procedure Laterality Date   BREAST LUMPECTOMY     TOTAL ABDOMINAL HYSTERECTOMY N/A    Social History   Socioeconomic History   Marital status: Married    Spouse name: Jill Salinas    Number of children: 0   Years of education: Assoc   Highest education level: Not on file  Occupational History   Occupation: retired  Tobacco Use   Smoking status: Never   Smokeless tobacco: Never  Vaping Use   Vaping Use: Never used  Substance and Sexual Activity   Alcohol use: Yes    Alcohol/week: 2.0 standard drinks of alcohol    Types: 2 Standard drinks or equivalent per week    Comment: occ   Drug use: No   Sexual activity: Not on file  Other Topics Concern   Not on file  Social History Narrative   Patient lives at home with her husband. Jill Salinas    Patient has an Geophysicist/field seismologist.    Patient has no children.    Patient is right handed.    Social Determinants of Health   Financial Resource Strain: Not on file  Food Insecurity: Not on file  Transportation Needs: Not on file  Physical Activity: Not on file  Stress: Not on file  Social Connections: Not on file  Intimate Partner Violence: Not on file   Current Outpatient  Medications on File Prior to Visit  Medication Sig Dispense Refill   Buprenorphine 15 MCG/HR PTWK as needed.     cyanocobalamin (,VITAMIN B-12,) 1000 MCG/ML injection ADMINISTER 1 ML (1,000 MCG) INTRAMUSCULARLY MONTHLY 10 ML BOTTLE 10 mL 1   denosumab (PROLIA) 60 MG/ML SOLN injection Inject 60 mg into the skin every 6 (six) months. Administer in upper arm, thigh, or abdomen     diclofenac sodium (VOLTAREN) 1 % GEL Apply 2 g topically as needed.     diphenhydrAMINE (BENADRYL) 50 MG tablet  Take 1 tablet (50 mg total) by mouth once for 1 dose. Pt to take 50 mg of Benadryl on 06/19/20 @ 1:40 PM. Please call [phone] with any questions. 30 tablet 0   DULoxetine (CYMBALTA) 60 MG capsule Take 60 mg by mouth daily.     folic acid (FOLVITE) 1 MG tablet TAKE 1 TABLET BY MOUTH EVERY DAY 90 tablet 0   Galcanezumab-gnlm (EMGALITY) 120 MG/ML SOAJ Inject 120 mg into the skin every 30 (thirty) days. 1 mL 11   LINZESS 290 MCG CAPS capsule TAKE 1 CAPSULE BY MOUTH EVERY DAY BEFORE BREAKFAST 90 capsule 1   LUMIGAN 0.01 % SOLN Place 1 drop into both eyes at bedtime.     Misc Natural Products (LEG VEIN & CIRCULATION PO) Take by mouth.     modafinil (PROVIGIL) 200 MG tablet Take 200 mg by mouth daily as needed.   1   Multiple Vitamins-Minerals (ONE-A-DAY VITACRAVES IMMUNITY PO) Take by mouth.     naratriptan (AMERGE) 2.5 MG tablet Take 1 tablet (2.5 mg total) by mouth as needed for migraine. Take one (1) tablet at onset of headache; if returns or does not resolve, may repeat after 4 hours; do not exceed five (5) mg in 24 hours. 10 tablet 5   ondansetron (ZOFRAN) 4 MG tablet Take 1 tablet (4 mg total) by mouth every 8 (eight) hours as needed. 20 tablet 0   oxyCODONE-acetaminophen (PERCOCET) 10-325 MG tablet Filled by pain clinic  0   polyethylene glycol (MIRALAX / GLYCOLAX) packet Take 17 g by mouth daily.     predniSONE (DELTASONE) 50 MG tablet Pt to take 50 mg of prednisone on 06/19/20 at 1:40 AM, 50 mg of prednisone  on 06/19/20 at 7:40 AM, and 50 mg of prednisone on 06/19/20 at 1:40 PM. Pt is also to take 50 mg of benadryl on 06/19/20 at 1:40 PM. Please call [phone] with any questions. 3 tablet 0   Rimegepant Sulfate (NURTEC) 75 MG TBDP Take 75 mg by mouth daily as needed (take for abortive therapy of migraine, no more than 1 tablet in 24 hours or 10 per month). 8 tablet 11   tretinoin (RETIN-A) 0.025 % cream APPLY TOPICALLY AT BEDTIME. 45 g 1   vitamin B-12 (CYANOCOBALAMIN) 1000 MCG tablet Take 1,000 mcg by mouth daily.     Vitamin D, Ergocalciferol, (DRISDOL) 1.25 MG (50000 UNIT) CAPS capsule TAKE 1 CAPSULE BY MOUTH ONE TIME PER WEEK 4 capsule 0   zolpidem (AMBIEN) 5 MG tablet Take 5 mg by mouth at bedtime as needed for sleep.     No current facility-administered medications on file prior to visit.   Allergies  Allergen Reactions   Depakote [Divalproex Sodium] Nausea And Vomiting   Gabapentin Other (See Comments)    Made her "feel crazy"   Iodinated Contrast Media Itching    Throat itching, pt received a 13 hour prep for injection   Codeine    Nortriptyline Other (See Comments)    constipation   Family History  Problem Relation Age of Onset   Heart disease Mother    Headache Mother    Heart attack Father     PE: BP 120/78 (BP Location: Left Arm, Patient Position: Sitting, Cuff Size: Normal)   Pulse (!) 101   Ht 5' (1.524 m)   Wt 140 lb 6.4 oz (63.7 kg)   SpO2 98%   BMI 27.42 kg/m  Wt Readings from Last 3 Encounters:  08/16/21 140 lb 6.4 oz (63.7  kg)  03/04/21 138 lb 0.1 oz (62.6 kg)  11/27/20 138 lb (62.6 kg)   Constitutional: Normal weight, in NAD. No kyphosis. Eyes: PERRLA, EOMI, no exophthalmos ENT: moist mucous membranes, no thyromegaly, no cervical lymphadenopathy Cardiovascular: RRR, No MRG Respiratory: CTA B Musculoskeletal: no deformities, strength intact in all 4 Skin: moist, warm, no rashes Neurological: + mild  tremor with outstretched hands, DTR normal in all  4  Assessment: 1. Osteoporosis  2.  Secondary hyperparathyroidism  Plan: 1. Osteoporosis -Likely postmenopausal (she had early, surgical, menopause)/ age-related/she also has family history of osteoporosis/she has a long history of steroid use (IM injections and also spine injections -Her latest DEXA scan result was reviewed we discussed that based on this she is at high risk for fractures -She had no falls or fractures since last visit -We again discussed about trying to do some exercise, but this is hindered by her fatigue and pain.  Discussed about chair exercises at least. -She tried to start Prolia 3 times but she did not continue with this.  She is not sure why.  We discussed that if we start this again, she needs to have it every 6 months and not delay the next injection due to the increased risk of fractures and bone density decreased.  She would like to start it again.  Injection given today. -She got her first Prolia injection 07/2019, which she tolerated well -At this visit we will also check a vitamin D level -Plan to check a bone density scan next year, hopefully after 2 Prolia injections - will see pt back in 1 year  2.  Secondary hyperparathyroidism -Patient with history of a low 24-hour urinary calcium of less than 16 -She is not taking calcium supplements, as recommended by Dr. Hartford Poli.  She is not sure why she did not start these... -I will have her start Citracal at 1200 mg daily split into 2 doses -At this visit, we discussed about checking her vitamin D and if this is normal, to change from ergocalciferol to Citracal with vitamin D.  I will let her know about the dose after the result returns  Unfortunately, patient has somewhat disjointed care, seeing different providers for the same problem (S) and not completely following the treatment plans.  If she continues to come and see me, I am hoping that we can get her on a consistent Prolia, calcium, and vitamin D  schedule.  Philemon Kingdom, MD PhD [facility] Endocrinology

## 2021-08-17 ENCOUNTER — Other Ambulatory Visit: Payer: Self-pay | Admitting: Internal Medicine

## 2021-08-17 DIAGNOSIS — F339 Major depressive disorder, recurrent, unspecified: Secondary | ICD-10-CM

## 2021-08-21 ENCOUNTER — Telehealth: Payer: Self-pay

## 2021-08-21 NOTE — Telephone Encounter (Signed)
Pt contacted office to request recommendation for Calcium. Pt also had questions about secondary dx from last office visit.

## 2021-08-22 NOTE — Telephone Encounter (Signed)
Atc but mailbox was full.

## 2021-08-22 NOTE — Telephone Encounter (Signed)
T, please advise her to take Citracal 500 over 600 mg twice a day, which ever she can find. This is for what is called secondary hyperparathyroidism, or an increase in the parathyroid hormone (the hormone that controls calcium) due to low urine calcium.  This was found during investigation by Dr. Hartford Poli, as discussed at last visit.  Starting calcium supplements will likely improve this.

## 2021-08-24 NOTE — Telephone Encounter (Signed)
Pt contacted and advised to take Citracal 500 or 600 mg twice a day, which ever she can find. This is for what is called secondary hyperparathyroidism, or an increase in the parathyroid hormone (the hormone that controls calcium) due to low urine calcium.  This was found during investigation by Dr. Hartford Poli, as discussed at last visit.  Starting calcium supplements will likely improve this.

## 2021-08-31 ENCOUNTER — Other Ambulatory Visit: Payer: Self-pay | Admitting: Internal Medicine

## 2021-08-31 DIAGNOSIS — G894 Chronic pain syndrome: Secondary | ICD-10-CM

## 2021-09-01 ENCOUNTER — Other Ambulatory Visit: Payer: Self-pay | Admitting: Internal Medicine

## 2021-09-06 ENCOUNTER — Ambulatory Visit: Payer: Medicare Other | Admitting: Family Medicine

## 2021-09-06 ENCOUNTER — Ambulatory Visit: Payer: Medicare Other | Admitting: Internal Medicine

## 2021-09-15 NOTE — Telephone Encounter (Signed)
Last Prolia inj 08/16/21 Next Prolia inj due 02/17/22

## 2021-09-19 ENCOUNTER — Other Ambulatory Visit: Payer: Self-pay | Admitting: Student

## 2021-09-19 DIAGNOSIS — M5416 Radiculopathy, lumbar region: Secondary | ICD-10-CM

## 2021-09-23 ENCOUNTER — Other Ambulatory Visit: Payer: Self-pay | Admitting: Internal Medicine

## 2021-09-23 DIAGNOSIS — F339 Major depressive disorder, recurrent, unspecified: Secondary | ICD-10-CM

## 2021-09-25 ENCOUNTER — Other Ambulatory Visit: Payer: Self-pay | Admitting: Internal Medicine

## 2021-10-03 ENCOUNTER — Ambulatory Visit
Admission: RE | Admit: 2021-10-03 | Discharge: 2021-10-03 | Disposition: A | Discharge status: Home / Self Care | Payer: Medicare Other | Source: Ambulatory Visit | Attending: Student | Admitting: Student

## 2021-10-03 DIAGNOSIS — M5416 Radiculopathy, lumbar region: Secondary | ICD-10-CM

## 2021-10-23 LAB — COLOGUARD: COLOGUARD: NEGATIVE

## 2021-11-27 ENCOUNTER — Ambulatory Visit: Payer: Medicare Other | Admitting: Family Medicine

## 2021-12-23 ENCOUNTER — Other Ambulatory Visit: Payer: Self-pay | Admitting: Internal Medicine

## 2022-02-05 NOTE — Telephone Encounter (Signed)
Prolia VOB initiated via [email or web address]  Last Prolia inj 08/16/21 Next Prolia inj due 02/17/22   Scheduled 02/20/22

## 2022-02-19 NOTE — Telephone Encounter (Signed)
Re-submitting VOB

## 2022-02-20 ENCOUNTER — Ambulatory Visit: Payer: Medicare Other

## 2022-02-22 ENCOUNTER — Ambulatory Visit: Payer: Medicare Other

## 2022-02-27 ENCOUNTER — Other Ambulatory Visit: Payer: Self-pay | Admitting: Internal Medicine

## 2022-02-28 NOTE — Telephone Encounter (Signed)
Pt ready for scheduling on or after 02/17/22  Out-of-pocket cost due at time of visit: $240 (Medicare deductible)  Primary: Medicare Prolia co-insurance: 20% (approximately $276) Admin fee co-insurance: 20% (approximately $25)  Deductible: $0 of $240 met  Secondary: [employer] [place] Prolia co-insurance: will consider part B co-insurance Admin fee co-insurance: will consider part B co-insurance  Deductible: NOT covered by secondary  Prior Auth: NOT required PA# Valid:   ** This summary of benefits is an estimation of the patient's out-of-pocket cost. Exact cost may vary based on individual plan coverage.

## 2022-03-01 ENCOUNTER — Ambulatory Visit: Payer: Medicare Other

## 2022-03-04 ENCOUNTER — Other Ambulatory Visit: Payer: Self-pay | Admitting: Internal Medicine

## 2022-03-04 NOTE — Telephone Encounter (Signed)
Patient's spouse will have patient call to schedule.

## 2022-03-07 NOTE — Telephone Encounter (Signed)
Patient's spouse will have patient call to schedule next Prolia injection.

## 2022-04-03 ENCOUNTER — Other Ambulatory Visit: Payer: Self-pay | Admitting: Internal Medicine

## 2022-04-04 ENCOUNTER — Other Ambulatory Visit: Payer: Self-pay

## 2022-04-04 MED ORDER — NARATRIPTAN HCL 2.5 MG PO TABS
2.5000 mg | ORAL_TABLET | ORAL | 0 refills | Status: AC | PRN
Start: 2022-04-04 — End: ?

## 2022-04-08 NOTE — Telephone Encounter (Signed)
Pt contacted and advised she is past due for her Prolia injection. Pt states she is trying to find a location she can do her bone scan and have her injection done at the same place. Pt understands that she is now off schedule with her injections and needs to get scheduled asap. Pt states she will have a different office processing her Prolia prior auths and injections. Pt will call to follow up regarding upcoming appt.

## 2022-04-08 NOTE — Telephone Encounter (Signed)
FYI  Patient > 30 days past due for PROLIA inj (last inj 08/16/21)  If you would like for pt to continue with Prolia therapy, please have clinical staff reach out to pt for scheduling and to explain to importance of receiving Prolia injections every 6 months as abrupt cessation of Prolia raises risk of osteoporotic fracture.    "Discontinuation of Dmab is associated with a 3- to 5-fold higher risk for vertebral, major osteoporotic, and hip fractures [38,39]."   [email or web address]

## 2022-05-31 ENCOUNTER — Ambulatory Visit: Payer: Medicare Other

## 2022-06-23 ENCOUNTER — Other Ambulatory Visit: Payer: Self-pay | Admitting: Internal Medicine

## 2022-06-25 ENCOUNTER — Other Ambulatory Visit: Payer: Self-pay | Admitting: Internal Medicine

## 2022-06-26 LAB — HM DEXA SCAN

## 2022-06-26 LAB — HM MAMMOGRAPHY

## 2022-06-27 ENCOUNTER — Encounter: Payer: Self-pay | Admitting: Internal Medicine

## 2022-08-20 ENCOUNTER — Ambulatory Visit: Payer: Medicare Other | Admitting: Internal Medicine

## 2022-08-20 ENCOUNTER — Encounter: Payer: Self-pay | Admitting: Internal Medicine

## 2022-08-20 VITALS — BP 120/70 | HR 94 | Ht 60.0 in | Wt 142.4 lb

## 2022-08-20 DIAGNOSIS — E211 Secondary hyperparathyroidism, not elsewhere classified: Secondary | ICD-10-CM | POA: Diagnosis not present

## 2022-08-20 DIAGNOSIS — M818 Other osteoporosis without current pathological fracture: Secondary | ICD-10-CM | POA: Diagnosis not present

## 2022-08-20 NOTE — Progress Notes (Signed)
Patient ID: Jill Salinas, female   DOB: 01/08/1946, 77 y.o.   MRN: [id number]   HPI  Jill Salinas is a 77 y.o.-year-old female, initially referred by her PCP, Dr. Ardyth Harps, returning for follow-up for osteoporosis (OP).  Last visit 1 year ago. She saw Endocrinologist in [place].  Interim history: No fractures or falls since last visit.   She has leg weakness and also 2 low back herniated disc.  She cannot exercise. She continues to complain of significant fatigue, chronic leg edema, muscle and joint aches, memory loss. She has nausea and weight gain.  Since last visit, she saw Dr. Shawnee Knapp with [facility] endocrinology.  She is not sure why she went to see him.  She only saw him once, on 12/19/2020.  I reviewed his note.  He diagnosed her with secondary hyperparathyroidism most likely due to low urinary calcium.  At that time, the 24-hour urine calcium was <16.  He suggested to start calcium citrate -combined with vitamin D twice a day, but she did not do so.  She continues on ergocalciferol 50,000 units weekly.  Reviewed and addended history: Pt was dx with OP >10 years ago.  I reviewed pt's DXA scan reports: Date L1-L4 T score FN T score 33% distal Radius  06/26/2022 ([facility]) -2.2 RFN: -2.8 LFN: -3.0 N/a  06/07/2019 ([facility])  -2.2 RFN: -2.8 LFN: -2.9 n/a   No dizziness/vertigo/orthostasis/poor vision. She has imbalance and has pain in her legs - knees. Also swelling and pain in legs.  Previous OP treatments:  - po Bisphosphonate: Fosamax - no significant effect - Forteo several years ago  -she mentions she did well on this - Prolia since started 2004-2005 years ago - until 09/2018 >> generalized bone pain - Restarted Prolia 07/2019, but she did not continue... - Restarted Prolia 08/29/2020, but she did not continue...  No h/o vitamin D deficiency. Reviewed available vit D levels: 12/19/2020: Vitamin D 56.3, calcitriol 92.5 (24.8-81.5) 08/2020: Vitamin D 105 (per reviewof Dr. Lacie Draft note) Lab  Results  Component Value Date   VD25OH 62.4 06/22/2020   VD25OH 80.41 06/22/2019   VD25OH 78.31 09/16/2018   VD25OH 67.5 05/12/2017   Pt is on: - calcium  - >> 2-3 tablets daily - vitamin D (ergocalciferol) 50,000 units once a week   No weight bearing exercises 2/2 joint pain.  She does not take high vitamin A doses.  Of note, she has a history of steroid injections in the spine. Also, as a child - she had eczema so she was on steroids long-term: im for many years. She is on oxycodone and seen in pain management clinic for bone and joint pains.  Menopause was at 77 y/o (surgical). No HRT.  FH of osteoporosis: Mother and father-both with hip fractures.  No h/o hyper/hypocalcemia or hyperparathyroidism. No h/o kidney stones. 12/13/2021: Calcium 9.4, albumin 4.5 07/27/2021: Calcium 9.8, albumin 4.7 Lab Results  Component Value Date   CALCIUM 10.0 03/04/2021   CALCIUM 9.8 11/25/2019   CALCIUM 9.9 06/22/2019   CALCIUM 9.6 09/16/2018   CALCIUM 9.4 11/21/2015    Ref Range & Units 12/24/2020 Comments  Calcium, Ur Not Estab. mg/dL <4.7  **Verified by repeat analysis**  Total Volume: 2000 mL  Calcium, 24H Urine 0 - 320 mg/24 hr <16    Creatinine, Urine Not Estab. mg/dL 82.9    Calcium/Creat.Ratio 29 - 442 mg/g creat <27 Low      No h/o thyrotoxicosis. Reviewed TSH recent levels:  12/13/2021: TSH 1.39 07/27/2021:  TSH 4.0 Lab Results  Component Value Date   TSH 3.49 09/16/2018   TSH 2.290 05/12/2017   + mild CKD. Last BUN/Cr: Component 12/13/21 07/27/21  HX SODIUM 139 141  HX POTASSIUM 4.4  3.9   HX CHLORIDE 103 102  HX CO2 24 26  HX BUN 16 17  HX GLUCOSE 93 96   HX CREATININE 1.09 0.99  HX CALCIUM 9.4 9.8  HX TOTAL PROTEIN 7.2 7.8   HX ALBUMIN 4.5 4.7  HX BILIRUBIN TOTAL 0.3 0.4   HX ALKALINE PHOSPHATASE 74 88  HX AST (SGOT) 18 20  HX ALT (SGPT) 11 12  HX ANION GAP (WC) 12 13  HX EST. GFR 52 Low   59 Low     07/27/2021: BUN/Cr 17/0.99 Lab Results  Component Value  Date   BUN 12 03/04/2021   CREATININE 0.91 03/04/2021   12/19/2020: Magnesium 2.4 (1.6-2.3)  She has a history of GERD and is on Nexium. She has a history of breast cancer, s/p chemotherapy and radiation therapy. 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  ROS: + See HPI + Fatigue, joint pains, reflux, hair loss  I reviewed pt's medications, allergies, PMH, social hx, family hx, and changes were documented in the history of present illness. Otherwise, unchanged from my initial visit note.  Past Medical History:  Diagnosis Date   Bowel obstruction (HCC)    Breast cancer (HCC)    Headache(784.0)    Migraine    Vision abnormalities    glaucoma   Past Surgical History:  Procedure Laterality Date   BREAST LUMPECTOMY     TOTAL ABDOMINAL HYSTERECTOMY N/A    Social History   Socioeconomic History   Marital status: Married    Spouse name: Chrissie Noa    Number of children: 0   Years of education: Assoc   Highest education level: Not on file  Occupational History   Occupation: retired  Tobacco Use   Smoking status: Never   Smokeless tobacco: Never  Vaping Use   Vaping Use: Never used  Substance and Sexual Activity   Alcohol use: Yes    Alcohol/week: 2.0 standard drinks of alcohol    Types: 2 Standard drinks or equivalent per week    Comment: occ   Drug use: No   Sexual activity: Not on file  Other Topics Concern   Not on file  Social History Narrative   Patient lives at home with her husband. Chrissie Noa    Patient has an Scientist, research (physical sciences).    Patient has no children.    Patient is right handed.    Social Determinants of Health   Financial Resource Strain: Not on file  Food Insecurity: Not on file  Transportation Needs: Not on file  Physical Activity: Not on file  Stress: Not on file  Social Connections: Not on file  Intimate Partner Violence: Not on file   Current Outpatient Medications on File Prior to Visit  Medication  Sig Dispense Refill   Buprenorphine 15 MCG/HR PTWK as needed.     cyanocobalamin (,VITAMIN B-12,) 1000 MCG/ML injection ADMINISTER 1 ML INTRAMUSCULARLY MONTHLY *NEED APPT* 10 mL 0   diclofenac sodium (VOLTAREN) 1 % GEL Apply 2 g topically as needed.     diphenhydrAMINE (BENADRYL) 50 MG tablet Take 1 tablet (50 mg total) by mouth once for 1 dose. Pt to take 50 mg of Benadryl on 06/19/20 @ 1:40 PM. Please call [phone] with any  questions. 30 tablet 0   DULoxetine (CYMBALTA) 60 MG capsule Take 60 mg by mouth daily.     folic acid (FOLVITE) 1 MG tablet TAKE 1 TABLET BY MOUTH EVERY DAY 90 tablet 0   Galcanezumab-gnlm (EMGALITY) 120 MG/ML SOAJ Inject 120 mg into the skin every 30 (thirty) days. 1 mL 11   LINZESS 290 MCG CAPS capsule TAKE 1 CAPSULE BY MOUTH EVERY DAY BEFORE BREAKFAST 90 capsule 0   LUMIGAN 0.01 % SOLN Place 1 drop into both eyes at bedtime.     Misc Natural Products (LEG VEIN & CIRCULATION PO) Take by mouth.     modafinil (PROVIGIL) 200 MG tablet Take 200 mg by mouth daily as needed.   1   Multiple Vitamins-Minerals (ONE-A-DAY VITACRAVES IMMUNITY PO) Take by mouth.     naratriptan (AMERGE) 2.5 MG tablet Take 1 tablet (2.5 mg total) by mouth as needed for migraine. Take one (1) tablet at onset of headache; if returns or does not resolve, may repeat after 4 hours; do not exceed five (5) mg in 24 hours. 10 tablet 0   ondansetron (ZOFRAN) 4 MG tablet Take 1 tablet (4 mg total) by mouth every 8 (eight) hours as needed. 20 tablet 0   oxyCODONE-acetaminophen (PERCOCET) 10-325 MG tablet Filled by pain clinic  0   polyethylene glycol (MIRALAX / GLYCOLAX) packet Take 17 g by mouth daily.     predniSONE (DELTASONE) 50 MG tablet Pt to take 50 mg of prednisone on 06/19/20 at 1:40 AM, 50 mg of prednisone on 06/19/20 at 7:40 AM, and 50 mg of prednisone on 06/19/20 at 1:40 PM. Pt is also to take 50 mg of benadryl on 06/19/20 at 1:40 PM. Please call [phone] with any questions. 3 tablet 0   Rimegepant  Sulfate (NURTEC) 75 MG TBDP Take 75 mg by mouth daily as needed (take for abortive therapy of migraine, no more than 1 tablet in 24 hours or 10 per month). 8 tablet 11   thiamine (VITAMIN B1) 100 MG tablet TAKE 1 TABLET BY MOUTH EVERY DAY 90 tablet 3   tretinoin (RETIN-A) 0.025 % cream APPLY TOPICALLY AT BEDTIME. 45 g 1   vitamin B-12 (CYANOCOBALAMIN) 1000 MCG tablet Take 1,000 mcg by mouth daily.     Vitamin D, Ergocalciferol, (DRISDOL) 1.25 MG (50000 UNIT) CAPS capsule TAKE 1 CAPSULE BY MOUTH ONE TIME PER WEEK 4 capsule 5   zolpidem (AMBIEN) 5 MG tablet Take 5 mg by mouth at bedtime as needed for sleep.     No current facility-administered medications on file prior to visit.   Allergies  Allergen Reactions   Depakote [Divalproex Sodium] Nausea And Vomiting   Gabapentin Other (See Comments)    Made her "feel crazy"   Iodinated Contrast Media Itching    Throat itching, pt received a 13 hour prep for injection   Codeine    Nortriptyline Other (See Comments)    constipation   Family History  Problem Relation Age of Onset   Heart disease Mother    Headache Mother    Heart attack Father    PE: BP 120/70   Pulse 94   Ht 5' (1.524 m)   Wt 142 lb 6.4 oz (64.6 kg)   SpO2 99%   BMI 27.81 kg/m  Wt Readings from Last 3 Encounters:  08/20/22 142 lb 6.4 oz (64.6 kg)  08/16/21 140 lb 6.4 oz (63.7 kg)  03/04/21 138 lb 0.1 oz (62.6 kg)   Constitutional: normal weight, in  NAD Eyes:  EOMI, no exophthalmos ENT: no neck masses, no cervical lymphadenopathy Cardiovascular: tachycardia, RR, No MRG Respiratory: CTA B Musculoskeletal: no deformities Skin:no rashes Neurological: + tremor with outstretched hands  Assessment: 1. Osteoporosis  2.  Secondary hyperparathyroidism  Plan: 1. Osteoporosis -No falls or fractures since last visit no falls or fractures since last visit -Likely postmenopausal (she had early, surgical, menopause)/ age-related/she also has family history of  osteoporosis/she has a long history of steroid use (IM injections and also spine injections) -Reviewed latest bone density results from 06/26/2022 and we discussed that based on this she is at high risk for fractures.  The T-scores were approximately stable -We again discussed about trying to do some exercise but this is hindered by fatigue and pain.  I recommended chair exercises at least.  She is trying to do these. -She got her first Prolia injection 07/2019, which she tolerated well.  However, she did not continue with this.  At last visit we discussed that she absolutely needs to do Prolia injections every 6 months to avoid worsening BMD and subsequent fractures, and she decided to do this.  She got an injection 08/16/2021 but did not come back for second injection in 02/2022.  This will be the fourth attempt to start Prolia for her.  She is not sure why she is not coming back for injections, but I suspect that she may forget.  I would not suggest this again.  At today's visit we discussed about starting Reclast.  I have recommended this, but she is reticent to start.  I advised her to think about it and let me know if she wants to try. -At this visit, we will also check a vitamin D level and a CMP -Plan to check a bone density scan hopefully after 2 Prolia injections - will see pt back in 1 year  2.  Secondary hyperparathyroidism -Most likely due to low 24-hour urine calcium, of less than 16 -In the past, she saw Dr. Shawnee Knapp for this problem.  He recommended calcium supplements but she did not start -At the last visit I recommended calcium citrate 500 or 600 mg twice a day she is taking 2 to 3 tablets daily -At today's visit we will recheck her vitamin D level  Component     Latest Ref Rng 08/20/2022  Sodium     135 - 145 mEq/L 139   Potassium     3.5 - 5.1 mEq/L 5.0   Chloride     96 - 112 mEq/L 102   CO2     19 - 32 mEq/L 26   Glucose     70 - 99 mg/dL 696 (H)   BUN     6 - 23 mg/dL 14    Creatinine     2.95 - 1.20 mg/dL 2.84   Calcium     8.4 - 10.5 mg/dL 13.2   GFR     >44.01 mL/min 59.85 (L)   VITD     30.00 - 100.00 ng/mL 71.59   Labs are normal with the exception of a slightly low GFR, which is better compared to her previous values.  This was not a fasting sample, so glucose level of 100 is not alarming.  Carlus Pavlov, MD PhD [facility] Endocrinology

## 2022-08-20 NOTE — Patient Instructions (Signed)
Please stop at the lab.  Continue ergocalciferol 50,000 units weekly.  Try to get ~1200 mg calcium daily.  Please come back for a follow-up appointment in 1 year.

## 2022-08-21 LAB — BASIC METABOLIC PANEL
BUN: 14 mg/dL (ref 6–23)
CO2: 26 mEq/L (ref 19–32)
Calcium: 10.2 mg/dL (ref 8.4–10.5)
Chloride: 102 mEq/L (ref 96–112)
Creatinine, Ser: 0.92 mg/dL (ref 0.40–1.20)
GFR: 59.85 mL/min — ABNORMAL LOW (ref >60.00)
Glucose, Bld: 100 mg/dL — ABNORMAL HIGH (ref 70–99)
Potassium: 5 mEq/L (ref 3.5–5.1)
Sodium: 139 mEq/L (ref 135–145)

## 2022-08-21 LAB — VITAMIN D 25 HYDROXY (VIT D DEFICIENCY, FRACTURES): VITD: 71.59 ng/mL (ref 30.00–100.00)

## 2022-08-29 NOTE — Telephone Encounter (Signed)
Per visit note 08/20/22:   Assessment: 1. Osteoporosis   2.  Secondary hyperparathyroidism   Plan: 1. Osteoporosis -No falls or fractures since last visit no falls or fractures since last visit -Likely postmenopausal (she had early, surgical, menopause)/ age-related/she also has family history of osteoporosis/she has a long history of steroid use (IM injections and also spine injections) -Reviewed latest bone density results from 06/26/2022 and we discussed that based on this she is at high risk for fractures.  The T-scores were approximately stable -We again discussed about trying to do some exercise but this is hindered by fatigue and pain.  I recommended chair exercises at least.  She is trying to do these. -She got her first Prolia injection 07/2019, which she tolerated well.  However, she did not continue with this.  At last visit we discussed that she absolutely needs to do Prolia injections every 6 months to avoid worsening BMD and subsequent fractures, and she decided to do this.  She got an injection 08/16/2021 but did not come back for second injection in 02/2022.  This will be the fourth attempt to start Prolia for her.  She is not sure why she is not coming back for injections, but I suspect that she may forget.  I would not suggest this again.  At today's visit we discussed about starting Reclast.  I have recommended this, but she is reticent to start.  I advised her to think about it and let me know if she wants to try.

## 2022-08-29 NOTE — Telephone Encounter (Signed)
Pt archived in [email or web address].  Please advise if patient and/or provider wish to proceed with Prolia therpay.  

## 2022-09-05 ENCOUNTER — Telehealth: Payer: Self-pay | Admitting: Internal Medicine

## 2022-09-05 NOTE — Telephone Encounter (Signed)
Patient is asking about getting an infusion of the Reclast and did not know where to call. Patient given the number to hospital and told to ask for the infusion center.just an [place].

## 2022-09-06 ENCOUNTER — Telehealth: Payer: Self-pay | Admitting: Pharmacy Technician

## 2022-09-06 ENCOUNTER — Encounter: Payer: Self-pay | Admitting: Internal Medicine

## 2022-09-06 NOTE — Addendum Note (Signed)
Addended by: Scarlette Shorts on: 09/06/2022 09:52 AM   Modules accepted: Orders

## 2022-09-06 NOTE — Telephone Encounter (Signed)
Patient has been advised and will wait for call

## 2022-09-06 NOTE — Telephone Encounter (Signed)
Patient called this morning stating that she tried scheduling her Reclast infusion. They were not able to schedule the appt because they need an order/referral. Could you place the order/referral in Dr. Althea Charon absence?

## 2022-09-06 NOTE — Telephone Encounter (Signed)
Dr. Lonzo Cloud, Fyi note:  Patient will be scheduled as soon as possible.  Auth Submission: NO AUTH NEEDED Site of care: Site of care: [facility] Payer: MEDICARE A/B & BCBS [place] Medication & CPT/J Code(s) submitted: Reclast (Zolendronic acid) [id number] Route of submission (phone, fax, portal):  Phone # Fax # Auth type: Buy/Bill Units/visits requested: X1 Reference number:  Approval from: 09/06/22 to 09/06/23

## 2022-09-19 MED ORDER — ZOLEDRONIC ACID 5 MG/100ML IV SOLN: 5.0000 mg | Freq: Once | INTRAVENOUS | Status: DC

## 2022-09-19 MED ORDER — DIPHENHYDRAMINE HCL 25 MG PO CAPS: 25.0000 mg | ORAL_CAPSULE | Freq: Once | ORAL | Status: DC

## 2022-09-19 MED ORDER — ACETAMINOPHEN 325 MG PO TABS: 650.0000 mg | ORAL_TABLET | Freq: Once | ORAL | Status: DC

## 2022-09-25 ENCOUNTER — Ambulatory Visit (INDEPENDENT_AMBULATORY_CARE_PROVIDER_SITE_OTHER): Payer: Medicare Other

## 2022-09-25 VITALS — BP 136/79 | HR 71 | Temp 97.5°F | Resp 18

## 2022-09-25 DIAGNOSIS — M818 Other osteoporosis without current pathological fracture: Secondary | ICD-10-CM | POA: Diagnosis not present

## 2022-09-25 MED ORDER — DIPHENHYDRAMINE HCL 25 MG PO CAPS
25.0000 mg | ORAL_CAPSULE | Freq: Once | ORAL | Status: AC
  Administered 2022-09-25: 25 mg via ORAL
  Filled 2022-09-25: qty 1

## 2022-09-25 MED ORDER — ZOLEDRONIC ACID 5 MG/100ML IV SOLN
5.0000 mg | Freq: Once | INTRAVENOUS | Status: AC
  Administered 2022-09-25: 5 mg via INTRAVENOUS
  Filled 2022-09-25: qty 100

## 2022-09-25 MED ORDER — SODIUM CHLORIDE 0.9 % IV SOLN: INTRAVENOUS | Status: DC

## 2022-09-25 MED ORDER — ACETAMINOPHEN 325 MG PO TABS
650.0000 mg | ORAL_TABLET | Freq: Once | ORAL | Status: AC
  Administered 2022-09-25: 650 mg via ORAL
  Filled 2022-09-25: qty 2

## 2022-09-25 NOTE — Patient Instructions (Signed)

## 2022-09-25 NOTE — Progress Notes (Signed)
Diagnosis: Osteoporosis  Provider:  Chilton Greathouse MD  Procedure: IV Infusion  IV Type: Peripheral, IV Location: R Antecubital  Reclast (Zolendronic Acid), Dose: 5 mg  Infusion Start Time: 1437  Infusion Stop Time: 1505  Post Infusion IV Care: Observation period completed and Peripheral IV Discontinued  Discharge: Condition: Good, Destination: Home . AVS Provided  Performed by:  Garnette Czech, RN

## 2022-11-07 ENCOUNTER — Ambulatory Visit
Admission: RE | Admit: 2022-11-07 | Discharge: 2022-11-07 | Disposition: A | Discharge status: Home / Self Care | Payer: Medicare Other | Source: Ambulatory Visit | Attending: Internal Medicine | Admitting: Internal Medicine

## 2022-11-07 ENCOUNTER — Other Ambulatory Visit: Payer: Self-pay | Admitting: Internal Medicine

## 2022-11-07 DIAGNOSIS — T402X5A Adverse effect of other opioids, initial encounter: Secondary | ICD-10-CM

## 2022-11-26 ENCOUNTER — Other Ambulatory Visit: Payer: Self-pay | Admitting: Internal Medicine

## 2023-04-03 ENCOUNTER — Telehealth: Payer: Self-pay | Admitting: Gastroenterology

## 2023-04-03 NOTE — Telephone Encounter (Signed)
 Will review when I return to the office. GM

## 2023-04-03 NOTE — Telephone Encounter (Signed)
 Good afternoon Dr. Meridee Score,    Patient's husband called requesting for patient to be schedule for an office visit with you. Patient has recent history with [facility]. Requesting to transfer due to patient feeling uncomfortable proceeding with previous provider. Stated their neighbor gave a referral to you specifically. Patient is wishing to be seen for a hernia in upper abdomen. Previous records are in Epic for you to review and advise on scheduling.    Thank you.

## 2023-04-04 ENCOUNTER — Encounter: Payer: Self-pay | Admitting: Physician Assistant

## 2023-04-04 NOTE — Telephone Encounter (Signed)
 Willing for patient to be evaluated for a 2nd opinion. She can be scheduled for a new-patient visit with me (do not use a hold or overbook slot). If she wants to be seen sooner she can be offered an APP visit and I can supervise, but as this looks like a 2nd opinion she may defer, but it can be offered. In interim, she will need to remain with her PCP or current GI for further workup until clinic visit. GM

## 2023-05-07 ENCOUNTER — Other Ambulatory Visit: Payer: Self-pay | Admitting: Internal Medicine

## 2023-05-07 ENCOUNTER — Encounter: Payer: Self-pay | Admitting: Internal Medicine

## 2023-05-07 DIAGNOSIS — F339 Major depressive disorder, recurrent, unspecified: Secondary | ICD-10-CM

## 2023-05-20 ENCOUNTER — Ambulatory Visit (INDEPENDENT_AMBULATORY_CARE_PROVIDER_SITE_OTHER): Payer: Medicare Other | Admitting: Physician Assistant

## 2023-05-20 ENCOUNTER — Encounter: Payer: Self-pay | Admitting: Physician Assistant

## 2023-05-20 VITALS — BP 104/70 | HR 100 | Ht 60.0 in | Wt 147.0 lb

## 2023-05-20 DIAGNOSIS — K449 Diaphragmatic hernia without obstruction or gangrene: Secondary | ICD-10-CM

## 2023-05-20 DIAGNOSIS — R635 Abnormal weight gain: Secondary | ICD-10-CM

## 2023-05-20 DIAGNOSIS — K219 Gastro-esophageal reflux disease without esophagitis: Secondary | ICD-10-CM

## 2023-05-20 DIAGNOSIS — K5900 Constipation, unspecified: Secondary | ICD-10-CM

## 2023-05-20 DIAGNOSIS — K5909 Other constipation: Secondary | ICD-10-CM

## 2023-05-20 NOTE — Progress Notes (Signed)
 Chief Complaint: Hiatal hernia  HPI:    Jill Salinas is a 78 year old female with a past medical history as listed below including breast cancer and prior bowel obstruction, who was referred to me by Jill Salinas,* for a complaint of hiatal hernia.      03/04/2021 CTAP without contrast with moderate to large hiatal hernia with 50 to 75% of the stomach contained in the chest, no substantial change since prior study, mild colonic diverticulosis without diverticulitis.    11/13/2022 abdominal x-ray with hiatal hernia.  Nonobstructive bowel gas pattern.    03/31/2023 patient seen at [facility] for bloating, urinary incontinence, memory issues, left-sided pain and weight gain.  At that time discussed intermittent bloating.  Difficulty obtaining medications including Linzess and Movantik.  Was taking Famotidine twice a day.  Discontinued pantoprazole by herself.  Also urinary incontinence.  Also 2 falls.  Also intermittent left-sided abdominal pain.  She complained of weight gain, about 9 pounds since her last visit in January.  Urinalysis was done.  Referred to PCP for memory issues.  Discussed her left-sided pain could be attributed to her hiatal hernia or constipation.  She was continued on her regimen of Linzess and Movantik.  Discussed GERD and continued on Famotidine.  Schedule follow-up there in June.    Today, patient presents to clinic accompanied by her husband.  He does assist with history.  Patient tells me that she has been following with [facility] and is happy there but her neighbor had recommended seeing Dr. Meridee Score so they decided to get a second opinion.  Really in regards to GI symptoms she is doing fairly well when compared to her office visit in February at [facility].  She continues on Linzess and Movantik and is having a bowel movement every other day.  No lower abdominal pain.  She is also on Famotidine for GERD twice daily and this controls her symptoms  well.      Her biggest complaint today is of a lump across her upper abdomen which has never been there before.  She believes that this is her hiatal hernia and thinks its gotten bigger.  She is uncomfortable and does not like to be gaining weight.  She is currently at 147 pounds on our scales (150 on 03/31/2023 at [facility]).  Denies any nausea, vomiting, chest pain, shortness of breath or other hiatal hernia related symptoms.    Denies fever, chills or weight loss.  Past Medical History:  Diagnosis Date   Bowel obstruction (HCC)    Breast cancer (HCC)    Headache(784.0)    Migraine    Vision abnormalities    glaucoma    Past Surgical History:  Procedure Laterality Date   BREAST LUMPECTOMY     TOTAL ABDOMINAL HYSTERECTOMY N/A     Current Outpatient Medications  Medication Sig Dispense Refill   Buprenorphine 15 MCG/HR PTWK as needed.     cyanocobalamin (,VITAMIN B-12,) 1000 MCG/ML injection ADMINISTER 1 ML INTRAMUSCULARLY MONTHLY *NEED APPT* 10 mL 0   diclofenac sodium (VOLTAREN) 1 % GEL Apply 2 g topically as needed.     diphenhydrAMINE (BENADRYL) 50 MG tablet Take 1 tablet (50 mg total) by mouth once for 1 dose. Pt to take 50 mg of Benadryl on 06/19/20 @ 1:40 PM. Please call [phone] with any questions. 30 tablet 0   DULoxetine (CYMBALTA) 60 MG capsule Take 60 mg by mouth daily.     folic acid (FOLVITE) 1 MG  tablet TAKE 1 TABLET BY MOUTH EVERY DAY 90 tablet 0   Galcanezumab-gnlm (EMGALITY) 120 MG/ML SOAJ Inject 120 mg into the skin every 30 (thirty) days. (Patient not taking: Reported on 08/20/2022) 1 mL 11   LINZESS 290 MCG CAPS capsule TAKE 1 CAPSULE BY MOUTH EVERY DAY BEFORE BREAKFAST 90 capsule 0   LUMIGAN 0.01 % SOLN Place 1 drop into both eyes at bedtime.     Misc Natural Products (LEG VEIN & CIRCULATION PO) Take by mouth.     modafinil (PROVIGIL) 200 MG tablet Take 200 mg by mouth daily as needed.   1   Multiple Vitamins-Minerals (ONE-A-DAY VITACRAVES IMMUNITY PO) Take by mouth.      naratriptan (AMERGE) 2.5 MG tablet Take 1 tablet (2.5 mg total) by mouth as needed for migraine. Take one (1) tablet at onset of headache; if returns or does not resolve, may repeat after 4 hours; do not exceed five (5) mg in 24 hours. (Patient not taking: Reported on 08/20/2022) 10 tablet 0   ondansetron (ZOFRAN) 4 MG tablet Take 1 tablet (4 mg total) by mouth every 8 (eight) hours as needed. 20 tablet 0   oxyCODONE-acetaminophen (PERCOCET) 10-325 MG tablet Filled by pain clinic  0   polyethylene glycol (MIRALAX / GLYCOLAX) packet Take 17 g by mouth daily.     predniSONE (DELTASONE) 50 MG tablet Pt to take 50 mg of prednisone on 06/19/20 at 1:40 AM, 50 mg of prednisone on 06/19/20 at 7:40 AM, and 50 mg of prednisone on 06/19/20 at 1:40 PM. Pt is also to take 50 mg of benadryl on 06/19/20 at 1:40 PM. Please call [phone] with any questions. (Patient not taking: Reported on 08/20/2022) 3 tablet 0   Rimegepant Sulfate (NURTEC) 75 MG TBDP Take 75 mg by mouth daily as needed (take for abortive therapy of migraine, no more than 1 tablet in 24 hours or 10 per month). (Patient not taking: Reported on 08/20/2022) 8 tablet 11   thiamine (VITAMIN B1) 100 MG tablet TAKE 1 TABLET BY MOUTH EVERY DAY 90 tablet 3   tretinoin (RETIN-A) 0.025 % cream APPLY TOPICALLY AT BEDTIME. 45 g 1   vitamin B-12 (CYANOCOBALAMIN) 1000 MCG tablet Take 1,000 mcg by mouth daily.     Vitamin D, Ergocalciferol, (DRISDOL) 1.25 MG (50000 UNIT) CAPS capsule TAKE 1 CAPSULE BY MOUTH ONE TIME PER WEEK 5 capsule 11   zolpidem (AMBIEN) 5 MG tablet Take 5 mg by mouth at bedtime as needed for sleep.     No current facility-administered medications for this visit.    Allergies as of 05/20/2023 - Review Complete 05/20/2023  Allergen Reaction Noted   Depakote [divalproex sodium] Nausea And Vomiting 06/15/2012   Gabapentin Other (See Comments) 05/12/2017   Iodinated contrast media Itching 09/17/2007   Codeine  02/28/2015   Nortriptyline Other (See  Comments) 06/30/2017    Family History  Problem Relation Age of Onset   Heart disease Mother    Headache Mother    Heart attack Father    Colon cancer Neg Hx    Stomach cancer Neg Hx    Esophageal cancer Neg Hx     Social History   Socioeconomic History   Marital status: Married    Spouse name: Chrissie Noa    Number of children: 0   Years of education: Assoc   Highest education level: Not on file  Occupational History   Occupation: retired  Tobacco Use   Smoking status: Never   Smokeless tobacco: Never  Vaping Use   Vaping status: Never Used  Substance and Sexual Activity   Alcohol use: Yes    Alcohol/week: 2.0 standard drinks of alcohol    Types: 2 Standard drinks or equivalent per week    Comment: occ   Drug use: No   Sexual activity: Not on file  Other Topics Concern   Not on file  Social History Narrative   Patient lives at home with her husband. Chrissie Noa    Patient has an Scientist, research (physical sciences).    Patient has no children.    Patient is right handed.    Social Drivers of Corporate investment banker Strain: Not on file  Food Insecurity: Low Risk  (03/31/2023)   Received from [facility]   Hunger Vital Sign    Worried About Running Out of Food in the Last Year: Never true    Ran Out of Food in the Last Year: Never true  Transportation Needs: No Transportation Needs (03/31/2023)   Received from [facility]    In the past 12 months, has lack of reliable transportation kept you from medical appointments, meetings, work or from getting things needed for daily living? : No  Physical Activity: Not on file  Stress: Not on file  Social Connections: Unknown (06/14/2021)   Received from [facility], [facility]   Social Network    Social Network: Not on file  Intimate Partner Violence: Unknown (05/18/2021)   Received from [employer], [facility]   HITS    Physically Hurt: Not on file    Insult or Talk Down To: Not on file    Threaten Physical  Harm: Not on file    Scream or Curse: Not on file    Review of Systems:    Constitutional: No weight loss, fever or chills Skin: No rash Cardiovascular: No chest pain, chest pressure or palpitations   Respiratory: No SOB  Gastrointestinal: See HPI and otherwise negative Genitourinary: No dysuria Neurological: No headache, dizziness or syncope Musculoskeletal: No new muscle or joint pain Hematologic: No bleeding  Psychiatric: No history of depression or anxiety   Physical Exam:  Vital signs: BP 104/70   Pulse 100   Ht 5' (1.524 m)   Wt 147 lb (66.7 kg)   BMI 28.71 kg/m    Constitutional:   Pleasant Caucasian female appears to be in NAD, Well developed, Well nourished, alert and cooperative Head:  Normocephalic and atraumatic. Eyes:   PEERL, EOMI. No icterus. Conjunctiva pink. Ears:  Normal auditory acuity. Neck:  Supple Throat: Oral cavity and pharynx without inflammation, swelling or lesion.  Respiratory: Respirations even and unlabored. Lungs clear to auscultation bilaterally.   No wheezes, crackles, or rhonchi.  Cardiovascular: Normal S1, S2. No MRG. Regular rate and rhythm. No peripheral edema, cyanosis or pallor.  Gastrointestinal:  Soft, nondistended, nontender. No rebound or guarding. Normal bowel sounds. No appreciable masses or hepatomegaly. Rectal:  Not performed.  Msk:  Symmetrical without gross deformities. Without edema, no deformity or joint abnormality.  Neurologic:  Alert and  oriented x4;  grossly normal neurologically.  Skin:   Dry and intact without significant lesions or rashes. Psychiatric: Demonstrates good judgement and reason without abnormal affect or behaviors.  RELEVANT LABS AND IMAGING: CBC    Component Value Date/Time   WBC 7.0 03/04/2021 1206   RBC 3.65 (L) 03/04/2021 1206   HGB 12.3 03/04/2021 1206   HCT 37.9 03/04/2021 1206   PLT 300 03/04/2021 1206   MCV  103.8 (H) 03/04/2021 1206   MCH 33.7 03/04/2021 1206   MCHC 32.5 03/04/2021 1206    RDW 12.8 03/04/2021 1206   LYMPHSABS 1.5 03/04/2021 1206   MONOABS 0.5 03/04/2021 1206   EOSABS 0.1 03/04/2021 1206   BASOSABS 0.1 03/04/2021 1206    CMP     Component Value Date/Time   NA 139 08/20/2022 1527   K 5.0 08/20/2022 1527   CL 102 08/20/2022 1527   CO2 26 08/20/2022 1527   GLUCOSE 100 (H) 08/20/2022 1527   BUN 14 08/20/2022 1527   CREATININE 0.92 08/20/2022 1527   CREATININE 0.93 11/25/2019 1456   CALCIUM 10.2 08/20/2022 1527   PROT 7.0 03/04/2021 1206   ALBUMIN 3.9 03/04/2021 1206   AST 25 03/04/2021 1206   ALT 23 03/04/2021 1206   ALKPHOS 64 03/04/2021 1206   BILITOT 0.5 03/04/2021 1206   GFRNONAA >60 03/04/2021 1206   GFRAA >60 11/21/2015 1104    Assessment: 1.  Hiatal hernia: Moderate to large size noted on last CT/x-ray, patient has reflux symptoms but these are controlled on Famotidine twice a day, otherwise no nausea/vomiting/chest pain or shortness of breath 2.  Weight gain: Patient tells me she has always ranged from 100 to 105 pounds, more recently has been in the range of 140-150 and is now experiencing some abdominal fat which is bothering her 3.  GERD: Controlled on Famotidine twice daily 4.  Constipation: Controlled on a combination of Linzess and Movantik  Plan: 1.  Discussed with patient that the lump across her upper abdomen abdomen that she is feeling is just abdominal fat.  This is likely from the weight that she has gained recently.  Explained her anatomy and what a hiatal hernia is.  Used diagrams in the room and answered her questions.  Explained that she is not really describing any hiatal hernia symptoms.  She is not having any chest pain, nausea or uncontrolled reflux.  I do not think there is any need to repair this hiatal hernia at this point.  I did offer her to see a cardiothoracic surgeon to discuss further but she declined.  Did discuss that sometimes these hiatal hernias can twist and cause an emergent situation.  Explained what those  symptoms would be like to her and told her to go to the ER emergently if that were to happen. 2.  For now continue on all of her chronic GI medications.  Will also let Dr. Meridee Score review her recent imaging.  Did discuss that it may or may not be pertinent for her to have a repeat CT of the chest just to get a better look at this hiatal hernia.  Will discuss with Dr. Meridee Score. 3.  Patient to follow in clinic with [place] as needed.  Suggested that she start walking daily for more exercise and weight loss.  She also should discuss weight gain/weight loss with her PCP.  Hyacinth Meeker, PA-C [facility] 05/20/2023, 2:16 PM  Cc: Jill Salinas,*

## 2023-05-20 NOTE — Patient Instructions (Addendum)
 _______________________________________________________  If your blood pressure at your visit was 140/90 or greater, please contact your primary care physician to follow up on this.  _______________________________________________________  If you are age 79 or older, your body mass index should be between 23-30. Your Body mass index is 28.71 kg/m. If this is out of the aforementioned range listed, please consider follow up with your Primary Care Provider.  If you are age 44 or younger, your body mass index should be between 19-25. Your Body mass index is 28.71 kg/m. If this is out of the aformentioned range listed, please consider follow up with your Primary Care Provider.   ________________________________________________________  The [place] [facility] providers would like to encourage you to use [facility] to communicate with providers for non-urgent requests or questions.  Due to long hold times on the telephone, sending your provider a message by [facility] may be a faster and more efficient way to get a response.  Please allow 48 business hours for a response.  Please remember that this is for non-urgent requests.  _______________________________________________________

## 2023-05-21 NOTE — Progress Notes (Signed)
 Attending Physician's Attestation   I have reviewed the chart.   I agree with the Advanced Practitioner's note, impression, and recommendations with any updates as below. She has a substantial hiatal hernia, but based on symptoms discussed at this time does seem like she is well-controlled on current therapy.  I agree that surgical intervention at this point does not make much sense.  As she is not having progressive symptoms of chest pain, I think that a repeat upper endoscopy right now is not required.  Cross-sectional imaging, can be considered but I think based again on what has been discussed with the patient today, we can hold on that unless she has progressive symptoms.  Agree that her constipation therapy that she is on seems to be helpful for her and would not make adjustments.  We can see her back in follow-up as needed or she may remain with her current [facility] provider since this was a second opinion clinic evaluation.   Corliss Parish, MD [facility] Advanced Endoscopy Office # [phone]

## 2023-07-09 ENCOUNTER — Other Ambulatory Visit: Payer: Self-pay

## 2023-07-09 ENCOUNTER — Emergency Department (HOSPITAL_COMMUNITY)
Admission: EM | Admit: 2023-07-09 | Discharge: 2023-07-10 | Disposition: A | Discharge status: Home / Self Care | Attending: Emergency Medicine | Admitting: Emergency Medicine

## 2023-07-09 ENCOUNTER — Encounter (HOSPITAL_COMMUNITY): Payer: Self-pay | Admitting: *Deleted

## 2023-07-09 DIAGNOSIS — R600 Localized edema: Secondary | ICD-10-CM | POA: Insufficient documentation

## 2023-07-09 DIAGNOSIS — Z853 Personal history of malignant neoplasm of breast: Secondary | ICD-10-CM | POA: Insufficient documentation

## 2023-07-09 LAB — URINALYSIS, ROUTINE W REFLEX MICROSCOPIC
Bilirubin Urine: NEGATIVE
Glucose, UA: NEGATIVE mg/dL
Hgb urine dipstick: NEGATIVE
Ketones, ur: NEGATIVE mg/dL
Nitrite: NEGATIVE
Protein, ur: NEGATIVE mg/dL
Specific Gravity, Urine: 1.011 (ref 1.005–1.030)
pH: 5 (ref 5.0–8.0)

## 2023-07-09 LAB — CBC
HCT: 36 % (ref 36.0–46.0)
Hemoglobin: 11.4 g/dL — ABNORMAL LOW (ref 12.0–15.0)
MCH: 31.3 pg (ref 26.0–34.0)
MCHC: 31.7 g/dL (ref 30.0–36.0)
MCV: 98.9 fL (ref 80.0–100.0)
Platelets: 253 10*3/uL (ref 150–400)
RBC: 3.64 MIL/uL — ABNORMAL LOW (ref 3.87–5.11)
RDW: 13.6 % (ref 11.5–15.5)
WBC: 5.7 10*3/uL (ref 4.0–10.5)
nRBC: 0 % (ref 0.0–0.2)

## 2023-07-09 NOTE — ED Triage Notes (Signed)
 The pt has had feet swelling for 3 days   no known injury

## 2023-07-10 DIAGNOSIS — R600 Localized edema: Secondary | ICD-10-CM | POA: Diagnosis not present

## 2023-07-10 LAB — COMPREHENSIVE METABOLIC PANEL WITH GFR
ALT: 14 U/L (ref 0–44)
AST: 25 U/L (ref 15–41)
Albumin: 3.7 g/dL (ref 3.5–5.0)
Alkaline Phosphatase: 67 U/L (ref 38–126)
Anion gap: 10 (ref 5–15)
BUN: 16 mg/dL (ref 8–23)
CO2: 26 mmol/L (ref 22–32)
Calcium: 9.4 mg/dL (ref 8.9–10.3)
Chloride: 102 mmol/L (ref 98–111)
Creatinine, Ser: 1.26 mg/dL — ABNORMAL HIGH (ref 0.44–1.00)
GFR, Estimated: 44 mL/min — ABNORMAL LOW (ref >60)
Glucose, Bld: 93 mg/dL (ref 70–99)
Potassium: 3.7 mmol/L (ref 3.5–5.1)
Sodium: 138 mmol/L (ref 135–145)
Total Bilirubin: 0.5 mg/dL (ref 0.0–1.2)
Total Protein: 6.9 g/dL (ref 6.5–8.1)

## 2023-07-10 LAB — LIPASE, BLOOD: Lipase: 30 U/L (ref 11–51)

## 2023-07-10 LAB — TROPONIN I (HIGH SENSITIVITY)
Troponin I (High Sensitivity): 10 ng/L (ref <18)
Troponin I (High Sensitivity): 11 ng/L (ref <18)

## 2023-07-10 NOTE — ED Provider Notes (Signed)
 [place] EMERGENCY DEPARTMENT AT [facility] Provider Note  CSN: [id number] Arrival date & time: 07/09/23 2238  Chief Complaint(s) No chief complaint on file.  HPI Jill Salinas is a 78 y.o. female with a past medical history listed below who presents to the emergency department with bilateral peripheral edema ongoing for several days.  Patient reports prior history of the same managed with compression stockings.  Denies any prior history of heart failure, liver disease or renal disease.  The patient reports that she has been more sedentary recently and has not been using her compression stockings.  Denies any shortness of breath, DOE, orthopnea.  HPI  Past Medical History Past Medical History:  Diagnosis Date   Bowel obstruction (HCC)    Breast cancer (HCC)    Headache(784.0)    Migraine    Vision abnormalities    glaucoma   Patient Active Problem List   Diagnosis Date Noted   Other osteoporosis without current pathological fracture 08/16/2021   Secondary hyperparathyroidism, non-renal (HCC) 08/16/2021   B12 deficiency 08/31/2019   Fatigue 11/09/2018   Migraine without status migrainosus, not intractable 11/09/2018   Depression, recurrent (HCC) 09/16/2018   Dizziness 06/11/2018   New onset headache 06/11/2018   Migraine    Neck pain 08/04/2013   Leg pain 08/04/2013   Low back pain 06/25/2013   Migraine without aura 06/15/2012   Home Medication(s) Prior to Admission medications   Medication Sig Start Date End Date Taking? Authorizing Provider  Buprenorphine 15 MCG/HR PTWK as needed. 10/01/18   [provider]  cyanocobalamin  (,VITAMIN B-12,) 1000 MCG/ML injection ADMINISTER 1 ML INTRAMUSCULARLY MONTHLY *NEED APPT* 09/03/21   Zilphia Hilt, Charyl Coppersmith, MD  diclofenac sodium (VOLTAREN) 1 % GEL Apply 2 g topically as needed.    [provider]  diphenhydrAMINE  (BENADRYL ) 50 MG tablet Take 1 tablet (50 mg total) by mouth once for 1 dose. Pt to  take 50 mg of Benadryl  on 06/19/20 @ 1:40 PM. Please call [phone] with any questions. 06/08/20 06/08/20  Aundra Lee, MD  DULoxetine (CYMBALTA) 60 MG capsule Take 60 mg by mouth daily. Patient not taking: Reported on 05/20/2023 10/06/20   [provider]  folic acid  (FOLVITE ) 1 MG tablet TAKE 1 TABLET BY MOUTH EVERY DAY 05/21/21   Zilphia Hilt, Charyl Coppersmith, MD  Galcanezumab -gnlm (EMGALITY ) 120 MG/ML SOAJ Inject 120 mg into the skin every 30 (thirty) days. Patient not taking: Reported on 05/20/2023 11/27/20   Lomax, Amy, NP  LINZESS  290 MCG CAPS capsule TAKE 1 CAPSULE BY MOUTH EVERY DAY BEFORE BREAKFAST 09/03/21   Zilphia Hilt, Charyl Coppersmith, MD  LUMIGAN 0.01 % SOLN Place 1 drop into both eyes at bedtime. 11/13/15   [provider]  Misc Natural Products (LEG VEIN & CIRCULATION PO) Take by mouth.    [provider]  modafinil (PROVIGIL) 200 MG tablet Take 200 mg by mouth daily as needed.  06/17/16   [provider]  Multiple Vitamins-Minerals (ONE-A-DAY VITACRAVES IMMUNITY PO) Take by mouth.    [provider]  naratriptan  (AMERGE) 2.5 MG tablet Take 1 tablet (2.5 mg total) by mouth as needed for migraine. Take one (1) tablet at onset of headache; if returns or does not resolve, may repeat after 4 hours; do not exceed five (5) mg in 24 hours. Patient not taking: Reported on 05/20/2023 04/04/22   Lomax, Amy, NP  ondansetron  (ZOFRAN ) 4 MG tablet Take 1 tablet (4 mg total) by mouth every 8 (eight) hours  as needed. Patient not taking: Reported on 05/20/2023 05/24/21   Lomax, Amy, NP  oxyCODONE-acetaminophen  (PERCOCET) 10-325 MG tablet Filled by pain clinic 06/17/16   [provider]  polyethylene glycol (MIRALAX  / GLYCOLAX ) packet Take 17 g by mouth daily.    [provider]  predniSONE  (DELTASONE ) 50 MG tablet Pt to take 50 mg of prednisone  on 06/19/20 at 1:40 AM, 50 mg of prednisone  on 06/19/20 at 7:40 AM, and 50 mg of prednisone  on 06/19/20 at 1:40 PM. Pt is  also to take 50 mg of benadryl  on 06/19/20 at 1:40 PM. Please call [phone] with any questions. Patient not taking: Reported on 05/20/2023 06/08/20   Aundra Lee, MD  Rimegepant Sulfate (NURTEC) 75 MG TBDP Take 75 mg by mouth daily as needed (take for abortive therapy of migraine, no more than 1 tablet in 24 hours or 10 per month). 05/24/21   Lomax, Amy, NP  thiamine (VITAMIN B1) 100 MG tablet TAKE 1 TABLET BY MOUTH EVERY DAY 09/26/21   Zilphia Hilt, Charyl Coppersmith, MD  tretinoin  (RETIN-A ) 0.025 % cream APPLY TOPICALLY AT BEDTIME. 02/29/20   Zilphia Hilt, Estela Y, MD  vitamin B-12 (CYANOCOBALAMIN ) 1000 MCG tablet Take 1,000 mcg by mouth daily.    [provider]  Vitamin D , Ergocalciferol , (DRISDOL ) 1.25 MG (50000 UNIT) CAPS capsule TAKE 1 CAPSULE BY MOUTH ONE TIME PER WEEK 11/26/22   Emilie Harden, MD  zolpidem (AMBIEN) 5 MG tablet Take 5 mg by mouth at bedtime as needed for sleep. Patient not taking: Reported on 05/20/2023    [provider]                                                                                                                                    Allergies Depakote [divalproex sodium], Gabapentin , Iodinated contrast media, Codeine, and Nortriptyline   Review of Systems Review of Systems As noted in HPI  Physical Exam Vital Signs  I have reviewed the triage vital signs BP 137/80   Pulse 80   Temp 97.7 F (36.5 C)   Resp 18   Ht 5' (1.524 m)   Wt 66.7 kg   SpO2 100%   BMI 28.72 kg/m   Physical Exam Vitals reviewed.  Constitutional:      General: She is not in acute distress.    Appearance: She is well-developed. She is not diaphoretic.  HENT:     Head: Normocephalic and atraumatic.     Nose: Nose normal.  Eyes:     General: No scleral icterus.       Right eye: No discharge.        Left eye: No discharge.     Conjunctiva/sclera: Conjunctivae normal.     Pupils: Pupils are equal, round, and reactive to light.  Cardiovascular:      Rate and Rhythm: Normal rate and regular rhythm.     Heart sounds: No murmur heard.  No friction rub. No gallop.  Pulmonary:     Effort: Pulmonary effort is normal. No respiratory distress.     Breath sounds: Normal breath sounds. No stridor. No rales.  Abdominal:     General: There is no distension.     Palpations: Abdomen is soft.     Tenderness: There is no abdominal tenderness.  Musculoskeletal:        General: No tenderness.     Cervical back: Normal range of motion and neck supple.     Right lower leg: 1+ Pitting Edema present.     Left lower leg: 1+ Pitting Edema present.  Skin:    General: Skin is warm and dry.     Findings: No erythema or rash.  Neurological:     Mental Status: She is alert and oriented to person, place, and time.     ED Results and Treatments Labs (all labs ordered are listed, but only abnormal results are displayed) Labs Reviewed  COMPREHENSIVE METABOLIC PANEL WITH GFR - Abnormal; Notable for the following components:      Result Value   Creatinine, Ser 1.26 (*)    GFR, Estimated 44 (*)    All other components within normal limits  CBC - Abnormal; Notable for the following components:   RBC 3.64 (*)    Hemoglobin 11.4 (*)    All other components within normal limits  URINALYSIS, ROUTINE W REFLEX MICROSCOPIC - Abnormal; Notable for the following components:   Leukocytes,Ua TRACE (*)    Bacteria, UA RARE (*)    All other components within normal limits  LIPASE, BLOOD  TROPONIN I (HIGH SENSITIVITY)  TROPONIN I (HIGH SENSITIVITY)                                                                                                                         EKG  EKG Interpretation Date/Time:  Thursday Jul 10 2023 04:03:33 EDT Ventricular Rate:  75 PR Interval:  169 QRS Duration:  138 QT Interval:  440 QTC Calculation: 492 R Axis:   -14  Text Interpretation: Sinus rhythm Right bundle branch block Confirmed by Townsend Freud ([id number]) on 07/10/2023  4:07:39 AM       Radiology No results found.  Medications Ordered in ED Medications - No data to display Procedures Procedures  (including critical care time) Medical Decision Making / ED Course   Medical Decision Making Amount and/or Complexity of Data Reviewed Labs: ordered. Decision-making details documented in ED Course. ECG/medicine tests: ordered and independent interpretation performed. Decision-making details documented in ED Course.    Patient presents with peripheral edema. Most suspicious for dependent edema versus venous stasis. Presentation and exam is not concerning for heart failure. Labs did reveal  renal insufficiency without overt AKI.  No transaminitis. Rest of the labs are reassuring as well.  Recommended increased activity and use of compression stocking with lower extremity elevation.  Close PCP follow-up    Final Clinical Impression(s) / ED Diagnoses Final diagnoses:  Peripheral edema   The patient appears reasonably screened and/or stabilized for discharge and I doubt any other medical condition or other [facility] requiring further screening, evaluation, or treatment in the ED at this time. I have discussed the findings, Dx and Tx plan with the patient/family who expressed understanding and agree(s) with the plan. Discharge instructions discussed at length. The patient/family was given strict return precautions who verbalized understanding of the instructions. No further questions at time of discharge.  Disposition: Discharge  Condition: Good  ED Discharge Orders     None        Follow Up: Primary care provider  Schedule an appointment as soon as possible for a visit  if you do not have a primary care physician, contact [facility] at [phone] for referral    This chart was dictated using voice recognition software.  Despite best efforts to proofread,  errors can occur which can change the documentation meaning.    Lindle Rhea, MD 07/10/23 [phone]

## 2023-08-21 ENCOUNTER — Encounter: Payer: Self-pay | Admitting: Internal Medicine

## 2023-08-21 ENCOUNTER — Ambulatory Visit (INDEPENDENT_AMBULATORY_CARE_PROVIDER_SITE_OTHER): Payer: Medicare Other | Admitting: Internal Medicine

## 2023-08-21 VITALS — BP 122/78 | HR 93 | Ht 60.0 in | Wt 143.2 lb

## 2023-08-21 DIAGNOSIS — E211 Secondary hyperparathyroidism, not elsewhere classified: Secondary | ICD-10-CM | POA: Diagnosis not present

## 2023-08-21 DIAGNOSIS — M818 Other osteoporosis without current pathological fracture: Secondary | ICD-10-CM | POA: Diagnosis not present

## 2023-08-21 DIAGNOSIS — R7989 Other specified abnormal findings of blood chemistry: Secondary | ICD-10-CM | POA: Diagnosis not present

## 2023-08-21 MED ORDER — VITAMIN D (ERGOCALCIFEROL) 1.25 MG (50000 UNIT) PO CAPS: 50000.0000 [IU] | ORAL_CAPSULE | ORAL | 3 refills | Status: AC

## 2023-08-21 NOTE — Patient Instructions (Addendum)
 Please stop at the lab.  Continue ergocalciferol  50,000 units weekly.  Please come back for a follow-up appointment in 1 year.

## 2023-08-21 NOTE — Progress Notes (Addendum)
 Patient ID: Jill Salinas, female   DOB: 12-04-1945, 78 y.o.   MRN: [id number]   HPI  Jill Salinas is a 78 y.o.-year-old female, initially referred by her PCP, Dr. Theophilus, returning for follow-up for osteoporosis (OP).  Last visit 1 year ago. She saw Endocrinologist in [place].  Interim history: No fractures or falls since last visit.   She has leg weakness and swelling and also 2 lower back herniated disc.  She cannot exercise 2/2 back pain. She continues to complain of significant fatigue, chronic leg edema, muscle and joint aches, nausea, wt gain, memory loss. She was in Urgent Care with depression 2 mo ago - she is a little better now.  She will see Pain Management here in town - will not drive to [facility] anymore after her father died and she does not need to travel there to see him.  Since last visit, she saw Dr. Beryl with [facility] endocrinology.  She is not sure why she went to see him.  She only saw him once, on 12/19/2020.  I reviewed his note.  He diagnosed her with secondary hyperparathyroidism most likely due to low urinary calcium.  At that time, the 24-hour urine calcium was <16.  He suggested to start calcium citrate -combined with vitamin D  twice a day, but she did not do so.  She continues on ergocalciferol  50,000 units weekly.  Reviewed and addended history: Pt was dx with OP >10 years ago.  I reviewed pt's DXA scan reports: Date L1-L4 T score FN T score 33% distal Radius  06/26/2022 ([facility]) -2.2 RFN: -2.8 LFN: -3.0 N/a  06/07/2019 ([facility])  -2.2 RFN: -2.8 LFN: -2.9 n/a   No dizziness/vertigo/orthostasis/poor vision. She has imbalance and has pain in her legs - knees. Also swelling and pain in legs.  Previous OP treatments:  - po Bisphosphonate: Fosamax - no significant effect - Forteo several years ago  -she mentions she did well on this - Prolia  since started 2004-2005 years ago - until 09/2018 >> generalized bone pain - Restarted Prolia  07/2019, but she did not  continue... - Restarted Prolia  08/29/2020, but she did not continue... - She had first her Reclast  infusion 09/25/2022.  She tolerated it well, without jaw/thigh/hip pain  No h/o vitamin D  deficiency. Reviewed available vit D levels: Lab Results  Component Value Date   VD25OH 71.59 08/20/2022   VD25OH 62.4 06/22/2020   VD25OH 80.41 06/22/2019   VD25OH 78.31 09/16/2018   VD25OH 67.5 05/12/2017  12/19/2020: Vitamin D  56.3, calcitriol 92.5 (24.8-81.5) 08/2020: Vitamin D  105 (per reviewof Dr. Karoline note)  Pt is on: - calcium  - >> 2-3 tablets daily - vitamin D  (ergocalciferol ) 50,000 units once a week   No weight bearing exercises 2/2 joint pain.  She does not take high vitamin A doses.  Of note, she has a history of steroid injections in the spine. Also, as a child - she had eczema so she was on steroids long-term: im for many years. She is on oxycodone and seen in pain management clinic for bone and joint pains.  Menopause was at 78 y/o (surgical). No HRT.  FH of osteoporosis: Mother and father-both with hip fractures.  No h/o hyper/hypocalcemia or hyperparathyroidism. No h/o kidney stones.   Lab Results  Component Value Date   CALCIUM 9.4 07/09/2023   CALCIUM 10.2 08/20/2022   CALCIUM 10.0 03/04/2021   CALCIUM 9.8 11/25/2019   CALCIUM 9.9 06/22/2019   CALCIUM 9.6 09/16/2018   CALCIUM 9.4 11/21/2015  12/13/2021: Calcium 9.4, albumin 4.5 07/27/2021: Calcium 9.8, albumin 4.7   Ref Range & Units 12/24/2020 Comments  Calcium, Ur Not Estab. mg/dL <9.1  **Verified by repeat analysis**  Total Volume: 2000 mL  Calcium, 24H Urine 0 - 320 mg/24 hr <16    Creatinine, Urine Not Estab. mg/dL 70.4    Calcium/Creat.Ratio 29 - 442 mg/g creat <27 Low      No h/o thyrotoxicosis. Reviewed TSH recent levels:  08/04/2023: TSH   0.450 - 5.330 uIU/mL 3.859  12/13/2021: TSH 1.39 07/27/2021: TSH 4.0 Lab Results  Component Value Date   TSH 3.49 09/16/2018   TSH 2.290 05/12/2017   + mild  CKD. Last BUN/Cr: Lab Results  Component Value Date   BUN 16 07/09/2023   CREATININE 1.26 (H) 07/09/2023   07/09/2023: GFR 44 Lab Results  Component Value Date   GFR 59.85 (L) 08/20/2022   GFR 75.36 06/22/2019   GFR 70.10 09/16/2018   12/19/2020: Magnesium  2.4 (1.6-2.3)  She has a history of GERD and is on Nexium. Also has a hiatal hernia.  She has a history of breast cancer, s/p chemotherapy and radiation therapy. 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  ROS: + See HPI + Fatigue, joint pains, reflux, hair loss  I reviewed pt's medications, allergies, PMH, social hx, family hx, and changes were documented in the history of present illness. Otherwise, unchanged from my initial visit note.  Past Medical History:  Diagnosis Date   Bowel obstruction (HCC)    Breast cancer (HCC)    Headache(784.0)    Migraine    Vision abnormalities    glaucoma   Past Surgical History:  Procedure Laterality Date   BREAST LUMPECTOMY     TOTAL ABDOMINAL HYSTERECTOMY N/A    Social History   Socioeconomic History   Marital status: Married    Spouse name: Jill Salinas    Number of children: 0   Years of education: Assoc   Highest education level: Not on file  Occupational History   Occupation: retired  Tobacco Use   Smoking status: Never   Smokeless tobacco: Never  Vaping Use   Vaping status: Never Used  Substance and Sexual Activity   Alcohol use: Yes    Alcohol/week: 2.0 standard drinks of alcohol    Types: 2 Standard drinks or equivalent per week    Comment: occassionally   Drug use: No   Sexual activity: Not on file  Other Topics Concern   Not on file  Social History Narrative   Patient lives at home with her husband. Jill Salinas    Patient has an Scientist, research (physical sciences).    Patient has no children.    Patient is right handed.    Social Drivers of Corporate investment banker Strain: Not on file  Food Insecurity: Low Risk  (08/04/2023)    Received from [facility]   Hunger Vital Sign    Within the past 12 months, you worried that your food would run out before you got money to buy more: Never true    Within the past 12 months, the food you bought just didn't last and you didn't have money to get more. : Never true  Transportation Needs: No Transportation Needs (08/04/2023)   Received from [facility]    In the past 12 months, has lack of reliable transportation kept you from medical appointments, meetings, work or from getting things needed for daily  living? : No  Physical Activity: Not on file  Stress: Not on file  Social Connections: Unknown (06/14/2021)   Received from [facility]   Social Network    Social Network: Not on file  Intimate Partner Violence: Unknown (05/18/2021)   Received from [facility]   HITS    Physically Hurt: Not on file    Insult or Talk Down To: Not on file    Threaten Physical Harm: Not on file    Scream or Curse: Not on file   Current Outpatient Medications on File Prior to Visit  Medication Sig Dispense Refill   Buprenorphine 15 MCG/HR PTWK as needed.     cyanocobalamin  (,VITAMIN B-12,) 1000 MCG/ML injection ADMINISTER 1 ML INTRAMUSCULARLY MONTHLY *NEED APPT* 10 mL 0   diclofenac sodium (VOLTAREN) 1 % GEL Apply 2 g topically as needed.     diphenhydrAMINE  (BENADRYL ) 50 MG tablet Take 1 tablet (50 mg total) by mouth once for 1 dose. Pt to take 50 mg of Benadryl  on 06/19/20 @ 1:40 PM. Please call [phone] with any questions. 30 tablet 0   DULoxetine (CYMBALTA) 60 MG capsule Take 60 mg by mouth daily. (Patient not taking: Reported on 05/20/2023)     folic acid  (FOLVITE ) 1 MG tablet TAKE 1 TABLET BY MOUTH EVERY DAY 90 tablet 0   Galcanezumab -gnlm (EMGALITY ) 120 MG/ML SOAJ Inject 120 mg into the skin every 30 (thirty) days. (Patient not taking: Reported on 05/20/2023) 1 mL 11   LINZESS  290 MCG CAPS capsule TAKE 1 CAPSULE BY MOUTH EVERY DAY BEFORE BREAKFAST 90 capsule 0    LUMIGAN 0.01 % SOLN Place 1 drop into both eyes at bedtime.     Misc Natural Products (LEG VEIN & CIRCULATION PO) Take by mouth.     modafinil (PROVIGIL) 200 MG tablet Take 200 mg by mouth daily as needed.   1   Multiple Vitamins-Minerals (ONE-A-DAY VITACRAVES IMMUNITY PO) Take by mouth.     naratriptan  (AMERGE) 2.5 MG tablet Take 1 tablet (2.5 mg total) by mouth as needed for migraine. Take one (1) tablet at onset of headache; if returns or does not resolve, may repeat after 4 hours; do not exceed five (5) mg in 24 hours. (Patient not taking: Reported on 05/20/2023) 10 tablet 0   ondansetron  (ZOFRAN ) 4 MG tablet Take 1 tablet (4 mg total) by mouth every 8 (eight) hours as needed. (Patient not taking: Reported on 05/20/2023) 20 tablet 0   oxyCODONE-acetaminophen  (PERCOCET) 10-325 MG tablet Filled by pain clinic  0   polyethylene glycol (MIRALAX  / GLYCOLAX ) packet Take 17 g by mouth daily.     predniSONE  (DELTASONE ) 50 MG tablet Pt to take 50 mg of prednisone  on 06/19/20 at 1:40 AM, 50 mg of prednisone  on 06/19/20 at 7:40 AM, and 50 mg of prednisone  on 06/19/20 at 1:40 PM. Pt is also to take 50 mg of benadryl  on 06/19/20 at 1:40 PM. Please call [phone] with any questions. (Patient not taking: Reported on 05/20/2023) 3 tablet 0   Rimegepant Sulfate (NURTEC) 75 MG TBDP Take 75 mg by mouth daily as needed (take for abortive therapy of migraine, no more than 1 tablet in 24 hours or 10 per month). 8 tablet 11   thiamine (VITAMIN B1) 100 MG tablet TAKE 1 TABLET BY MOUTH EVERY DAY 90 tablet 3   tretinoin  (RETIN-A ) 0.025 % cream APPLY TOPICALLY AT BEDTIME. 45 g 1   vitamin B-12 (CYANOCOBALAMIN ) 1000 MCG tablet Take 1,000 mcg by mouth daily.  Vitamin D , Ergocalciferol , (DRISDOL ) 1.25 MG (50000 UNIT) CAPS capsule TAKE 1 CAPSULE BY MOUTH ONE TIME PER WEEK 5 capsule 11   zolpidem (AMBIEN) 5 MG tablet Take 5 mg by mouth at bedtime as needed for sleep. (Patient not taking: Reported on 05/20/2023)     No current  facility-administered medications on file prior to visit.   Allergies  Allergen Reactions   Depakote [Divalproex Sodium] Nausea And Vomiting   Gabapentin  Other (See Comments)    Made her feel crazy   Iodinated Contrast Media Itching    Throat itching, pt received a 13 hour prep for injection   Codeine    Nortriptyline  Other (See Comments)    constipation   Family History  Problem Relation Age of Onset   Heart disease Mother    Headache Mother    Heart attack Father    Colon cancer Neg Hx    Stomach cancer Neg Hx    Esophageal cancer Neg Hx    PE: BP 122/78   Pulse 93   Ht 5' (1.524 m)   Wt 143 lb 3.2 oz (65 kg)   SpO2 98%   BMI 27.97 kg/m  Wt Readings from Last 3 Encounters:  08/21/23 143 lb 3.2 oz (65 kg)  07/09/23 147 lb 0.8 oz (66.7 kg)  05/20/23 147 lb (66.7 kg)   Constitutional: normal weight, in NAD Eyes:  EOMI, no exophthalmos ENT: no neck masses, no cervical lymphadenopathy Cardiovascular: tachycardia, RR, No MRG, + mild pitting bilateral lower extremity edema Respiratory: CTA B Musculoskeletal: no deformities except arthritic changes in her hands Skin:+ Circular macular rash on legs and forearms (from childhood) Neurological: + tremor with outstretched hands  Assessment: 1. Osteoporosis  2.  Secondary hyperparathyroidism  Plan: 1. Osteoporosis - No falls or fractures since last visit - Postmenopausal (she had early, surgical, menopause (/age-related/she also has family history of osteoporosis/she has a long history of steroid use (IM injections and also spine injections). -Reviewing the latest bone density scan from 06/2022, she does appear to be at high risk for fractures.  However, T-scores are approximately stable. -I did previously recommend exercise but this is hindered by fatigue and pain.  I recommended at least chair exercises at last visit and she was trying to do this, but she now mentions that she is not able to do this due to pain.. -She  got her first Prolia  injection 07/2019, which she tolerated well.  However, she did not continue with this.  At last visit we discussed that she absolutely needs to do Prolia  injections every 6 months to avoid worsening BMD and subsequent fractures, and she decided to do this.  She got an injection 08/16/2021 but did not come back for second injection in 02/2022.  This could have been the fourth attempt to start Prolia  for her.  She was not sure why she was not coming back for injections, but I suspected that she may forget.  I considered that it would not be safe to continue to try her on Prolia , and we decided to go ahead with Reclast .  She had the first infusion in 09/2022 and she tolerated it well.  Will continue this for at least 2 more years. - At this visit, we will recheck a BMP and a vitamin D  level. -Plan to check a bone density scan next year - will see pt back in 1 year  2.  Secondary hyperparathyroidism - Most likely due to the low 24-hour urine calcium, of less  than 16 - In the past, she saw Dr. Beryl for this problem.  He recommended calcium supplements but she did not start - We started calcium supplements and I advised her to take calcium citrate 500-600 mg twice a day -not taking 2 to 3 tablets daily - Her vitamin D  level was normal at last visit at 71.5 - Her calcium level was normal on 07/09/2023, at 9.4 - Will repeat her vitamin D  and calcium level today  After the results are back, I we will order another Reclast  infusion.   Orders Placed This Encounter  Procedures   VITAMIN D  25 Hydroxy (Vit-D Deficiency, Fractures)   Basic Metabolic Panel Without GFR   Component     Latest Ref Rng 08/21/2023  Sodium     135 - 146 mmol/L 142   Potassium     3.5 - 5.3 mmol/L 3.7   Chloride     98 - 110 mmol/L 102   CO2     20 - 32 mmol/L 30   Glucose     65 - 99 mg/dL 84   BUN     7 - 25 mg/dL 19   Creatinine     9.39 - 1.00 mg/dL 8.80 (H)   Calcium     8.6 - 10.4 mg/dL 9.5    BUN/Creatinine Ratio     6 - 22 (calc) 16   Vitamin D , 25-Hydroxy     30 - 100 ng/mL >150 (H)     Vitamin D  level is very high.  I will have her stop the vitamin D  supplement right away. Plan to recheck her vitamin D  level in 4 months.  Creatinine appears to have improved.  Calcium is normal.  Lela Fendt, MD PhD [facility] Endocrinology

## 2023-08-22 ENCOUNTER — Telehealth: Payer: Self-pay

## 2023-08-22 ENCOUNTER — Ambulatory Visit: Payer: Self-pay | Admitting: Internal Medicine

## 2023-08-22 LAB — BASIC METABOLIC PANEL WITHOUT GFR
BUN/Creatinine Ratio: 16 (calc) (ref 6–22)
BUN: 19 mg/dL (ref 7–25)
CO2: 30 mmol/L (ref 20–32)
Calcium: 9.5 mg/dL (ref 8.6–10.4)
Chloride: 102 mmol/L (ref 98–110)
Creat: 1.19 mg/dL — ABNORMAL HIGH (ref 0.60–1.00)
Glucose, Bld: 84 mg/dL (ref 65–99)
Potassium: 3.7 mmol/L (ref 3.5–5.3)
Sodium: 142 mmol/L (ref 135–146)

## 2023-08-22 LAB — VITAMIN D 25 HYDROXY (VIT D DEFICIENCY, FRACTURES): Vit D, 25-Hydroxy: >150 ng/mL — ABNORMAL HIGH (ref 30–100)

## 2023-08-22 NOTE — Progress Notes (Signed)
 Called patient to schedule.Stated she will call back next week to schedule.

## 2023-08-22 NOTE — Telephone Encounter (Signed)
 Dr. Trixie, patient will be scheduled as soon as possible.  Auth Submission: NO AUTH NEEDED Site of care: Site of care: [facility] Payer: Medicare A/B and [employer] supplement Medication & CPT/J Code(s) submitted: Reclast  (Zolendronic acid) J3489 Diagnosis Code:  Route of submission (phone, fax, portal):  Phone # Fax # Auth type: Buy/Bill PB Units/visits requested: 5mg  x 1 dose Reference number:  Approval from: 08/22/23 to 02/11/24

## 2023-08-22 NOTE — Addendum Note (Signed)
 Addended by: TRIXIE FILE on: 08/22/2023 12:17 PM   Modules accepted: Orders

## 2023-09-12 ENCOUNTER — Ambulatory Visit

## 2023-09-12 VITALS — BP 137/81 | HR 75 | Temp 98.3°F | Resp 12 | Ht 60.0 in | Wt 138.6 lb

## 2023-09-12 DIAGNOSIS — M818 Other osteoporosis without current pathological fracture: Secondary | ICD-10-CM | POA: Diagnosis not present

## 2023-09-12 MED ORDER — SODIUM CHLORIDE 0.9 % IV SOLN: INTRAVENOUS | Status: DC

## 2023-09-12 MED ORDER — ZOLEDRONIC ACID 5 MG/100ML IV SOLN
5.0000 mg | Freq: Once | INTRAVENOUS | Status: AC
Start: 2023-09-12 — End: 2023-09-12
  Administered 2023-09-12: 5 mg via INTRAVENOUS
  Filled 2023-09-12: qty 100

## 2023-09-12 MED ORDER — DIPHENHYDRAMINE HCL 25 MG PO CAPS
25.0000 mg | ORAL_CAPSULE | Freq: Once | ORAL | Status: AC
  Administered 2023-09-12: 25 mg via ORAL
  Filled 2023-09-12: qty 1

## 2023-09-12 MED ORDER — ACETAMINOPHEN 325 MG PO TABS
650.0000 mg | ORAL_TABLET | Freq: Once | ORAL | Status: AC
  Administered 2023-09-12: 650 mg via ORAL
  Filled 2023-09-12: qty 2

## 2023-09-12 NOTE — Progress Notes (Signed)
 Diagnosis: Osteoporosis  Provider:  Lonna Coder MD  Procedure: IV Infusion  IV Type: Peripheral, IV Location: R Forearm  Reclast  (Zolendronic Acid), Dose: 5 mg  Infusion Start Time: 1521  Infusion Stop Time: 1554  Post Infusion IV Care: Observation period completed and Peripheral IV Discontinued  Discharge: Condition: Good, Destination: Home . AVS Provided  Performed by:  Bekah Igoe, RN

## 2023-09-12 NOTE — Patient Instructions (Signed)

## 2023-09-30 ENCOUNTER — Encounter: Payer: Self-pay | Admitting: Adult Medicine

## 2023-09-30 ENCOUNTER — Other Ambulatory Visit: Payer: Self-pay | Admitting: Adult Medicine

## 2023-09-30 DIAGNOSIS — Z1231 Encounter for screening mammogram for malignant neoplasm of breast: Secondary | ICD-10-CM

## 2023-10-01 ENCOUNTER — Ambulatory Visit

## 2024-01-26 ENCOUNTER — Telehealth: Payer: Self-pay

## 2024-01-26 NOTE — Telephone Encounter (Signed)
 Patient called asking if she should still be taking Vitamin D .  Advised that her level was elevated in July and she was to return for lab work.  Patient never had blood work.  She is very upset because she now has a broken foot and doesn't understand why if her vitamin D  level was high.  Attempted to make an appointment for patient to follow up and get her questions answered but she refused and said she is unhappy with the way things are handled at the office.  I apologized and offered any other assistance but patient refused.

## 2024-08-20 ENCOUNTER — Ambulatory Visit: Admitting: Internal Medicine
